# Patient Record
Sex: Female | Born: 1970 | Race: White | Hispanic: No | Marital: Married | Smoking: Never smoker
Health system: Southern US, Community
[De-identification: ages and names within clinical notes are randomized; demographics above are authoritative.]

## PROBLEM LIST (undated history)

## (undated) DIAGNOSIS — Q7010 Webbed fingers, unspecified hand: Secondary | ICD-10-CM

## (undated) DIAGNOSIS — F32A Depression, unspecified: Secondary | ICD-10-CM

## (undated) DIAGNOSIS — O09529 Supervision of elderly multigravida, unspecified trimester: Secondary | ICD-10-CM

## (undated) DIAGNOSIS — S96819A Strain of other specified muscles and tendons at ankle and foot level, unspecified foot, initial encounter: Secondary | ICD-10-CM

## (undated) DIAGNOSIS — R0600 Dyspnea, unspecified: Secondary | ICD-10-CM

## (undated) DIAGNOSIS — Z8619 Personal history of other infectious and parasitic diseases: Secondary | ICD-10-CM

## (undated) DIAGNOSIS — Z8249 Family history of ischemic heart disease and other diseases of the circulatory system: Secondary | ICD-10-CM

## (undated) DIAGNOSIS — M549 Dorsalgia, unspecified: Secondary | ICD-10-CM

## (undated) DIAGNOSIS — F419 Anxiety disorder, unspecified: Secondary | ICD-10-CM

## (undated) DIAGNOSIS — R0683 Snoring: Secondary | ICD-10-CM

## (undated) DIAGNOSIS — M5126 Other intervertebral disc displacement, lumbar region: Secondary | ICD-10-CM

## (undated) DIAGNOSIS — Z9989 Dependence on other enabling machines and devices: Secondary | ICD-10-CM

## (undated) DIAGNOSIS — F329 Major depressive disorder, single episode, unspecified: Secondary | ICD-10-CM

## (undated) DIAGNOSIS — R293 Abnormal posture: Secondary | ICD-10-CM

## (undated) DIAGNOSIS — G4733 Obstructive sleep apnea (adult) (pediatric): Secondary | ICD-10-CM

## (undated) DIAGNOSIS — R5383 Other fatigue: Secondary | ICD-10-CM

## (undated) DIAGNOSIS — E049 Nontoxic goiter, unspecified: Secondary | ICD-10-CM

## (undated) DIAGNOSIS — Z72820 Sleep deprivation: Secondary | ICD-10-CM

## (undated) DIAGNOSIS — T7840XA Allergy, unspecified, initial encounter: Secondary | ICD-10-CM

## (undated) DIAGNOSIS — S93499A Sprain of other ligament of unspecified ankle, initial encounter: Secondary | ICD-10-CM

## (undated) DIAGNOSIS — M2550 Pain in unspecified joint: Secondary | ICD-10-CM

## (undated) HISTORY — DX: Webbed fingers, unspecified hand: Q70.10

## (undated) HISTORY — DX: Sleep deprivation: Z72.820

## (undated) HISTORY — DX: Family history of ischemic heart disease and other diseases of the circulatory system: Z82.49

## (undated) HISTORY — PX: ACHILLES TENDON REPAIR: SUR1153

## (undated) HISTORY — DX: Supervision of elderly multigravida, unspecified trimester: O09.529

## (undated) HISTORY — PX: BACK SURGERY: SHX140

## (undated) HISTORY — DX: Dyspnea, unspecified: R06.00

## (undated) HISTORY — DX: Other intervertebral disc displacement, lumbar region: M51.26

## (undated) HISTORY — DX: Sprain of other ligament of unspecified ankle, initial encounter: S93.499A

## (undated) HISTORY — DX: Strain of other specified muscles and tendons at ankle and foot level, unspecified foot, initial encounter: S96.819A

## (undated) HISTORY — DX: Anxiety disorder, unspecified: F41.9

## (undated) HISTORY — DX: Pain in unspecified joint: M25.50

## (undated) HISTORY — DX: Personal history of other infectious and parasitic diseases: Z86.19

## (undated) HISTORY — DX: Allergy, unspecified, initial encounter: T78.40XA

## (undated) HISTORY — DX: Other fatigue: R53.83

## (undated) HISTORY — PX: SKIN GRAFT: SHX250

## (undated) HISTORY — DX: Depression, unspecified: F32.A

## (undated) HISTORY — DX: Nontoxic goiter, unspecified: E04.9

## (undated) HISTORY — DX: Dorsalgia, unspecified: M54.9

## (undated) HISTORY — DX: Abnormal posture: R29.3

## (undated) HISTORY — DX: Dependence on other enabling machines and devices: Z99.89

## (undated) HISTORY — DX: Obstructive sleep apnea (adult) (pediatric): G47.33

## (undated) HISTORY — DX: Snoring: R06.83

## (undated) HISTORY — DX: Major depressive disorder, single episode, unspecified: F32.9

---

## 1998-09-02 ENCOUNTER — Encounter: Admission: RE | Admit: 1998-09-02 | Discharge: 1998-12-01 | Payer: Self-pay | Admitting: Obstetrics & Gynecology

## 1999-01-21 ENCOUNTER — Other Ambulatory Visit: Admission: RE | Admit: 1999-01-21 | Discharge: 1999-01-21 | Payer: Self-pay | Admitting: Obstetrics and Gynecology

## 2000-03-02 ENCOUNTER — Other Ambulatory Visit: Admission: RE | Admit: 2000-03-02 | Discharge: 2000-03-02 | Payer: Self-pay | Admitting: Obstetrics and Gynecology

## 2001-03-10 ENCOUNTER — Other Ambulatory Visit: Admission: RE | Admit: 2001-03-10 | Discharge: 2001-03-10 | Payer: Self-pay | Admitting: *Deleted

## 2002-03-19 ENCOUNTER — Other Ambulatory Visit: Admission: RE | Admit: 2002-03-19 | Discharge: 2002-03-19 | Payer: Self-pay | Admitting: *Deleted

## 2003-08-28 ENCOUNTER — Other Ambulatory Visit: Admission: RE | Admit: 2003-08-28 | Discharge: 2003-08-28 | Payer: Self-pay | Admitting: Gynecology

## 2004-10-22 ENCOUNTER — Other Ambulatory Visit: Admission: RE | Admit: 2004-10-22 | Discharge: 2004-10-22 | Payer: Self-pay | Admitting: Gynecology

## 2005-11-04 ENCOUNTER — Other Ambulatory Visit: Admission: RE | Admit: 2005-11-04 | Discharge: 2005-11-04 | Payer: Self-pay | Admitting: Gynecology

## 2006-03-22 ENCOUNTER — Emergency Department (HOSPITAL_COMMUNITY): Admission: EM | Admit: 2006-03-22 | Discharge: 2006-03-22 | Payer: Self-pay | Admitting: Family Medicine

## 2006-08-25 ENCOUNTER — Ambulatory Visit (HOSPITAL_COMMUNITY): Admission: RE | Admit: 2006-08-25 | Discharge: 2006-08-26 | Payer: Self-pay | Admitting: Neurosurgery

## 2009-12-18 ENCOUNTER — Inpatient Hospital Stay (HOSPITAL_COMMUNITY): Admission: AD | Admit: 2009-12-18 | Discharge: 2009-12-20 | Payer: Self-pay | Admitting: Obstetrics and Gynecology

## 2010-08-30 LAB — CBC
HCT: 37.9 % (ref 36.0–46.0)
Hemoglobin: 10.3 g/dL — ABNORMAL LOW (ref 12.0–15.0)
Hemoglobin: 12.6 g/dL (ref 12.0–15.0)
MCH: 28.3 pg (ref 26.0–34.0)
MCV: 86.1 fL (ref 78.0–100.0)
RBC: 3.58 MIL/uL — ABNORMAL LOW (ref 3.87–5.11)
RBC: 4.44 MIL/uL (ref 3.87–5.11)
RDW: 14.5 % (ref 11.5–15.5)
RDW: 14.9 % (ref 11.5–15.5)

## 2010-08-30 LAB — RPR: RPR Ser Ql: NONREACTIVE

## 2010-10-30 NOTE — Op Note (Signed)
[name], Sharon Myers NO.:  [id number]   MEDICAL RECORD NO.:  [id number]          PATIENT TYPE:  OIB   LOCATION:  3013                         FACILITY:  [facility]   PHYSICIAN:  Clydene Fake, M.D.  DATE OF BIRTH:  04-Mar-1971   DATE OF PROCEDURE:  08/25/2006  DATE OF DISCHARGE:                               OPERATIVE REPORT   PREOPERATIVE DIAGNOSIS:  Herniated nucleus pulposus right L5-S1,  spondylosis.   POSTOPERATIVE DIAGNOSIS:  Herniated nucleus pulposus right L5-S1,  spondylosis.   PROCEDURE:  Right L5-S1 semi-hemilaminectomy and discectomy,  microdissection with microscope.   SURGEON:  Clydene Fake, M.D.   Threasa HeadsDanielle Dess.   ANESTHESIA:  General endotracheal tube anesthesia.   ESTIMATED BLOOD LOSS:  Minimal.   BLOOD GIVEN:  None.   DRAINS:  None.   COMPLICATIONS:  None.   REASON FOR PROCEDURE:  The patient is a 40 year old woman who has been  having back and right leg pain and numbness with decreased sensation in  the right S1 distribution, decreased reflex, and motor strength intact.  MRI was done, showing __________ disk herniation central to the right  causing canal stenosis and S1 root compression.  The patient brought in  for decompression.   PROCEDURE IN DETAIL:  The patient brought to operating room, general  anesthesia induced.  The patient was placed in the prone position on a  Wilson frame with all pressure points padded.  The patient was prepped  and draped in sterile fashion.  Needle was placed in the interspace.  X-  rays were obtained showing we were at the S1 spinous process.  Incision  was then made in the midline lower lumbar spine, centered just above  where the needle was placed.  Incision taken down to the fascia and  hemostasis obtained with Bovie cauterization.  The fascia was incised in  the right side and subperiosteal dissection was noted over the L5 and S1  spinous process of lamina up to the facet.   Self-retaining retractor was  placed.  A marker was placed at the interspace.  Another x-ray was  obtained, confirming our positioning at L5-S1.  The microscope was  brought in for microdissection.  At this point a high speed drill was  used to start a semi-hemilaminectomy and medial facetectomy.  This was  completed with Kerrison punches.  Ligamentum flavum was removed and a  foraminotomy was done over the S1 root.  We explored the epidural space  and found a large subligamentous disk herniation up into the S1 root.  At the thecal sac we incised the disk space and removed disk with  pituitary rongeurs and curets.  When we were finished, we had good  decompression of the canal and the S1 root and the fiber was checked and  there was no compression of that as it went up to its foramen up above  the disk space.  We got hemostasis with bipolar cauterization, Gelfoam,  and thrombin.  Gelfoam was irrigated out.  We irrigated with antibiotic  solution.  We again checked the nerve roots and we had good  decompression.  Depo-Medrol was placed in the epidural space.  Retractors were removed.  Fascia closed with 0 Vicryl interrupted  suture.  Subcutaneous tissue closed with 0, 2-0, and 3-0 Vicryl  interrupted suture.  Skin closed with Dermabond and skin glue.  When  that was dry, a dressing was placed.  The patient was placed back in the  supine position, awakened from anesthesia, and transferred to recovery  room in stable condition.           ______________________________  Clydene Fake, M.D.     JRH/MEDQ  D:  08/25/2006  T:  08/25/2006  Job:  [id number]

## 2010-11-17 ENCOUNTER — Other Ambulatory Visit (HOSPITAL_COMMUNITY): Payer: Self-pay | Admitting: Obstetrics and Gynecology

## 2010-11-17 DIAGNOSIS — O09529 Supervision of elderly multigravida, unspecified trimester: Secondary | ICD-10-CM

## 2010-11-17 DIAGNOSIS — Z36 Encounter for antenatal screening for chromosomal anomalies: Secondary | ICD-10-CM

## 2010-11-23 LAB — HIV ANTIBODY (ROUTINE TESTING W REFLEX): HIV: NONREACTIVE

## 2010-11-23 LAB — RPR: RPR: NONREACTIVE

## 2010-11-23 LAB — ABO/RH

## 2010-11-23 LAB — ANTIBODY SCREEN: Antibody Screen: NEGATIVE

## 2010-12-01 LAB — GC/CHLAMYDIA PROBE AMP, GENITAL
Chlamydia: NEGATIVE
Gonorrhea: NEGATIVE

## 2010-12-09 ENCOUNTER — Other Ambulatory Visit (HOSPITAL_COMMUNITY): Payer: Self-pay

## 2010-12-09 ENCOUNTER — Encounter (HOSPITAL_COMMUNITY): Payer: Self-pay

## 2010-12-11 ENCOUNTER — Other Ambulatory Visit (HOSPITAL_COMMUNITY): Payer: Self-pay

## 2011-01-05 ENCOUNTER — Other Ambulatory Visit (HOSPITAL_COMMUNITY): Payer: Self-pay | Admitting: Obstetrics and Gynecology

## 2011-01-05 DIAGNOSIS — O2690 Pregnancy related conditions, unspecified, unspecified trimester: Secondary | ICD-10-CM

## 2011-01-13 ENCOUNTER — Ambulatory Visit (HOSPITAL_COMMUNITY): Admission: RE | Admit: 2011-01-13 | Payer: 59 | Source: Ambulatory Visit

## 2011-01-13 ENCOUNTER — Encounter (HOSPITAL_COMMUNITY): Payer: Self-pay

## 2011-01-13 ENCOUNTER — Other Ambulatory Visit: Payer: Self-pay | Admitting: Maternal and Fetal Medicine

## 2011-01-13 ENCOUNTER — Ambulatory Visit (HOSPITAL_COMMUNITY)
Admission: RE | Admit: 2011-01-13 | Discharge: 2011-01-13 | Disposition: A | Discharge status: Home / Self Care | Payer: 59 | Source: Ambulatory Visit | Attending: Obstetrics and Gynecology | Admitting: Obstetrics and Gynecology

## 2011-01-13 ENCOUNTER — Other Ambulatory Visit (HOSPITAL_COMMUNITY): Payer: Self-pay | Admitting: Obstetrics and Gynecology

## 2011-01-13 DIAGNOSIS — Z363 Encounter for antenatal screening for malformations: Secondary | ICD-10-CM | POA: Insufficient documentation

## 2011-01-13 DIAGNOSIS — Z1389 Encounter for screening for other disorder: Secondary | ICD-10-CM | POA: Insufficient documentation

## 2011-01-13 DIAGNOSIS — O09529 Supervision of elderly multigravida, unspecified trimester: Secondary | ICD-10-CM | POA: Insufficient documentation

## 2011-01-13 DIAGNOSIS — O358XX0 Maternal care for other (suspected) fetal abnormality and damage, not applicable or unspecified: Secondary | ICD-10-CM | POA: Insufficient documentation

## 2011-01-13 DIAGNOSIS — O2690 Pregnancy related conditions, unspecified, unspecified trimester: Secondary | ICD-10-CM

## 2011-01-13 NOTE — Progress Notes (Signed)
See ultrasound report in [facility] 

## 2011-01-29 ENCOUNTER — Telehealth (HOSPITAL_COMMUNITY): Payer: Self-pay | Admitting: MS"

## 2011-01-29 NOTE — Telephone Encounter (Signed)
Patient called to inquire about amniotic fluid AFP result. She previously was contacted by Despina Arias regarding apparently normal chromosomes from amniocentesis. AF-AFP is normal. Result to patient.   Clydie Braun Dallin Mccorkel, MS, [facility] 01/29/11

## 2011-03-09 ENCOUNTER — Other Ambulatory Visit: Payer: Self-pay | Admitting: Maternal and Fetal Medicine

## 2011-06-15 NOTE — L&D Delivery Note (Signed)
Delivery Note At 10:25 AM a viable and healthy female was delivered via Vaginal, Spontaneous Delivery (Presentation: Right Occiput Anterior).  APGAR: 8, 9; weight 7 lb 10.8 oz (3481 g).   Placenta status: Intact, Spontaneous. Not sent  Cord: 3 vessels with the following complications: CAN x 1 not reducible, clamped and cut  Cord pH: none  Anesthesia: Epidural  Episiotomy: None Lacerations: Labial Suture Repair: 3.0 chromic Est. Blood Loss (mL): 200  Mom to postpartum.  Baby to nursery-stable.  Hina Gupta A 06/27/2011, 11:06 AM

## 2011-06-17 ENCOUNTER — Encounter (HOSPITAL_COMMUNITY): Payer: Self-pay | Admitting: *Deleted

## 2011-06-17 ENCOUNTER — Telehealth (HOSPITAL_COMMUNITY): Payer: Self-pay | Admitting: *Deleted

## 2011-06-17 NOTE — Telephone Encounter (Signed)
Preadmission screen  

## 2011-06-26 ENCOUNTER — Other Ambulatory Visit: Payer: Self-pay | Admitting: Obstetrics and Gynecology

## 2011-06-26 ENCOUNTER — Encounter (HOSPITAL_COMMUNITY): Payer: Self-pay

## 2011-06-26 ENCOUNTER — Inpatient Hospital Stay (HOSPITAL_COMMUNITY)
Admission: RE | Admit: 2011-06-26 | Discharge: 2011-06-29 | DRG: 775 | Disposition: A | Discharge status: Home / Self Care | Payer: 59 | Source: Ambulatory Visit | Attending: Obstetrics and Gynecology | Admitting: Obstetrics and Gynecology

## 2011-06-26 DIAGNOSIS — O09529 Supervision of elderly multigravida, unspecified trimester: Secondary | ICD-10-CM | POA: Diagnosis present

## 2011-06-26 LAB — CBC
MCH: 29.8 pg (ref 26.0–34.0)
MCHC: 35 g/dL (ref 30.0–36.0)
Platelets: 174 10*3/uL (ref 150–400)
RBC: 4.49 MIL/uL (ref 3.87–5.11)

## 2011-06-26 MED ORDER — ZOLPIDEM TARTRATE 10 MG PO TABS
10.0000 mg | ORAL_TABLET | Freq: Every evening | ORAL | Status: DC | PRN
  Administered 2011-06-26: 10 mg via ORAL
  Filled 2011-06-26: qty 1

## 2011-06-26 MED ORDER — ONDANSETRON HCL 4 MG/2ML IJ SOLN
4.0000 mg | Freq: Four times a day (QID) | INTRAMUSCULAR | Status: DC | PRN
  Administered 2011-06-27: 4 mg via INTRAVENOUS
  Filled 2011-06-26: qty 2

## 2011-06-26 MED ORDER — IBUPROFEN 600 MG PO TABS: 600.0000 mg | ORAL_TABLET | Freq: Four times a day (QID) | ORAL | Status: DC | PRN

## 2011-06-26 MED ORDER — OXYTOCIN 10 UNIT/ML IJ SOLN: 10.0000 [IU] | Freq: Once | INTRAMUSCULAR | Status: DC

## 2011-06-26 MED ORDER — LIDOCAINE HCL (PF) 1 % IJ SOLN
30.0000 mL | INTRAMUSCULAR | Status: DC | PRN
  Administered 2011-06-27: 30 mL via SUBCUTANEOUS
  Filled 2011-06-26 (×2): qty 30

## 2011-06-26 MED ORDER — TERBUTALINE SULFATE 1 MG/ML IJ SOLN: 0.2500 mg | Freq: Once | INTRAMUSCULAR | Status: AC | PRN

## 2011-06-26 MED ORDER — OXYCODONE-ACETAMINOPHEN 5-325 MG PO TABS: 2.0000 | ORAL_TABLET | ORAL | Status: DC | PRN

## 2011-06-26 MED ORDER — OXYTOCIN 20 UNITS IN LACTATED RINGERS INFUSION - SIMPLE
1.0000 m[IU]/min | INTRAVENOUS | Status: DC
  Administered 2011-06-27: 999 m[IU]/min via INTRAVENOUS
  Filled 2011-06-26: qty 1000

## 2011-06-26 MED ORDER — LACTATED RINGERS IV SOLN
500.0000 mL | INTRAVENOUS | Status: DC | PRN
  Administered 2011-06-27: 1000 mL via INTRAVENOUS

## 2011-06-26 MED ORDER — MISOPROSTOL 25 MCG QUARTER TABLET
25.0000 ug | ORAL_TABLET | ORAL | Status: DC | PRN
  Administered 2011-06-26 – 2011-06-27 (×3): 25 ug via VAGINAL
  Filled 2011-06-26 (×3): qty 0.25

## 2011-06-26 MED ORDER — ACETAMINOPHEN 325 MG PO TABS: 650.0000 mg | ORAL_TABLET | ORAL | Status: DC | PRN

## 2011-06-26 MED ORDER — NALBUPHINE SYRINGE 5 MG/0.5 ML
10.0000 mg | INJECTION | INTRAMUSCULAR | Status: DC | PRN
  Administered 2011-06-27: 10 mg via INTRAVENOUS
  Filled 2011-06-26 (×2): qty 1

## 2011-06-26 MED ORDER — LACTATED RINGERS IV SOLN
INTRAVENOUS | Status: DC
  Administered 2011-06-26 – 2011-06-27 (×2): via INTRAVENOUS

## 2011-06-26 MED ORDER — CITRIC ACID-SODIUM CITRATE 334-500 MG/5ML PO SOLN: 30.0000 mL | ORAL | Status: DC | PRN

## 2011-06-26 NOTE — H&P (Signed)
Sharon Myers is a 41 y.o. female presenting for induction 2nd to AMA 41 yo History OB History    Grav Para Term Preterm Abortions TAB SAB Ect Mult Living   4 1 1  0 2 1 1  0 0 1     Past Medical History  Diagnosis Date  . AMA (advanced maternal age) multigravida 35+   . Depression   . Other ankle sprain and strain     achilles rupture  . Goiter, unspecified   . History of chicken pox   . 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 Ruptured lumbar disc    Past Surgical History  Procedure Date  . Back surgery   . Achilles tendon repair   . Skin graft    Family History: family history includes Arthritis in her maternal grandmother; COPD in her father; Cancer in her father, maternal grandfather, and mother; Diabetes in her father; Heart disease in her maternal grandfather; Heart failure in her mother; Hypertension in her father; Nephrolithiasis in her father; Stroke in her mother; and Thyroid disease in her maternal aunt. Social History:  reports that she has never smoked. She has never used smokeless tobacco. She reports that she does not drink alcohol or use illicit drugs.  ROS neg   Temperature 97.7 F (36.5 C), temperature source Oral, height 5\' 9"  (1.753 m), weight 112.038 kg (247 lb). Exam Physical Exam  Constitutional: She is oriented to person, place, and time. She appears well-developed and well-nourished.  HENT:  Head: Normocephalic.  Eyes: EOM are normal.  Cardiovascular: Regular rhythm.   Respiratory: Breath sounds normal.  GI: Soft.  Musculoskeletal: She exhibits edema.       (+) 1  Neurological: She is alert and oriented to person, place, and time.  Skin: Skin is warm and dry.  Psychiatric: She has a normal mood and affect.    Prenatal labs: ABO, Rh: O/Positive/-- (06/11 0000) Antibody: Negative (06/11 0000) Rubella: Immune (06/11 0000) RPR: Nonreactive (06/11 0000)  HBsAg: Negative (06/11 0000)  HIV: Non-reactive (06/11 0000)  GBS: Negative (12/12  0000)   Assessment/Plan: Term gestation P) admit   2) routine labs 3) cytotec. Epidural prn   Briselda Naval A 06/26/2011, 8:48 PM

## 2011-06-27 ENCOUNTER — Encounter (HOSPITAL_COMMUNITY): Payer: Self-pay | Admitting: Anesthesiology

## 2011-06-27 ENCOUNTER — Encounter (HOSPITAL_COMMUNITY): Payer: Self-pay

## 2011-06-27 ENCOUNTER — Inpatient Hospital Stay (HOSPITAL_COMMUNITY): Payer: 59 | Admitting: Anesthesiology

## 2011-06-27 DIAGNOSIS — O09529 Supervision of elderly multigravida, unspecified trimester: Secondary | ICD-10-CM | POA: Diagnosis present

## 2011-06-27 LAB — ABO/RH: ABO/RH(D): O POS

## 2011-06-27 LAB — RPR: RPR Ser Ql: NONREACTIVE

## 2011-06-27 MED ORDER — PHENYLEPHRINE 40 MCG/ML (10ML) SYRINGE FOR IV PUSH (FOR BLOOD PRESSURE SUPPORT)
80.0000 ug | PREFILLED_SYRINGE | INTRAVENOUS | Status: DC | PRN
  Filled 2011-06-27: qty 5

## 2011-06-27 MED ORDER — ZOLPIDEM TARTRATE 5 MG PO TABS: 5.0000 mg | ORAL_TABLET | Freq: Every evening | ORAL | Status: DC | PRN

## 2011-06-27 MED ORDER — FENTANYL 2.5 MCG/ML BUPIVACAINE 1/10 % EPIDURAL INFUSION (WH - ANES)
14.0000 mL/h | INTRAMUSCULAR | Status: DC
  Filled 2011-06-27: qty 60

## 2011-06-27 MED ORDER — TETANUS-DIPHTH-ACELL PERTUSSIS 5-2.5-18.5 LF-MCG/0.5 IM SUSP: 0.5000 mL | Freq: Once | INTRAMUSCULAR | Status: DC

## 2011-06-27 MED ORDER — OXYCODONE-ACETAMINOPHEN 5-325 MG PO TABS
1.0000 | ORAL_TABLET | ORAL | Status: DC | PRN
  Administered 2011-06-27: 1 via ORAL
  Administered 2011-06-28: 2 via ORAL
  Administered 2011-06-28 – 2011-06-29 (×4): 1 via ORAL
  Filled 2011-06-27 (×2): qty 1
  Filled 2011-06-27: qty 2
  Filled 2011-06-27: qty 1
  Filled 2011-06-27: qty 2
  Filled 2011-06-27: qty 1

## 2011-06-27 MED ORDER — SERTRALINE HCL 25 MG PO TABS
25.0000 mg | ORAL_TABLET | Freq: Every day | ORAL | Status: DC
  Administered 2011-06-27 – 2011-06-29 (×3): 25 mg via ORAL
  Filled 2011-06-27 (×4): qty 1

## 2011-06-27 MED ORDER — EPHEDRINE 5 MG/ML INJ
10.0000 mg | INTRAVENOUS | Status: DC | PRN
  Filled 2011-06-27: qty 4

## 2011-06-27 MED ORDER — SIMETHICONE 80 MG PO CHEW: 80.0000 mg | CHEWABLE_TABLET | ORAL | Status: DC | PRN

## 2011-06-27 MED ORDER — PHENYLEPHRINE 40 MCG/ML (10ML) SYRINGE FOR IV PUSH (FOR BLOOD PRESSURE SUPPORT): 80.0000 ug | PREFILLED_SYRINGE | INTRAVENOUS | Status: DC | PRN

## 2011-06-27 MED ORDER — DIPHENHYDRAMINE HCL 50 MG/ML IJ SOLN: 12.5000 mg | INTRAMUSCULAR | Status: DC | PRN

## 2011-06-27 MED ORDER — LACTATED RINGERS IV SOLN: 500.0000 mL | Freq: Once | INTRAVENOUS | Status: DC

## 2011-06-27 MED ORDER — WITCH HAZEL-GLYCERIN EX PADS
1.0000 "application " | MEDICATED_PAD | CUTANEOUS | Status: DC | PRN
  Administered 2011-06-27: 1 via TOPICAL

## 2011-06-27 MED ORDER — BENZOCAINE-MENTHOL 20-0.5 % EX AERO: 1.0000 "application " | INHALATION_SPRAY | CUTANEOUS | Status: DC | PRN

## 2011-06-27 MED ORDER — EPHEDRINE 5 MG/ML INJ: 10.0000 mg | INTRAVENOUS | Status: DC | PRN

## 2011-06-27 MED ORDER — IBUPROFEN 600 MG PO TABS
600.0000 mg | ORAL_TABLET | Freq: Four times a day (QID) | ORAL | Status: DC
  Administered 2011-06-27 – 2011-06-29 (×8): 600 mg via ORAL
  Filled 2011-06-27 (×8): qty 1

## 2011-06-27 MED ORDER — SODIUM BICARBONATE 8.4 % IV SOLN
INTRAVENOUS | Status: DC | PRN
  Administered 2011-06-27: 5 mL via EPIDURAL

## 2011-06-27 MED ORDER — SENNOSIDES-DOCUSATE SODIUM 8.6-50 MG PO TABS
2.0000 | ORAL_TABLET | Freq: Every day | ORAL | Status: DC
  Administered 2011-06-27 – 2011-06-28 (×2): 2 via ORAL

## 2011-06-27 MED ORDER — ONDANSETRON HCL 4 MG PO TABS: 4.0000 mg | ORAL_TABLET | ORAL | Status: DC | PRN

## 2011-06-27 MED ORDER — LANOLIN HYDROUS EX OINT: TOPICAL_OINTMENT | CUTANEOUS | Status: DC | PRN

## 2011-06-27 MED ORDER — ONDANSETRON HCL 4 MG/2ML IJ SOLN: 4.0000 mg | INTRAMUSCULAR | Status: DC | PRN

## 2011-06-27 MED ORDER — BENZOCAINE-MENTHOL 20-0.5 % EX AERO
INHALATION_SPRAY | CUTANEOUS | Status: AC
  Filled 2011-06-27: qty 56

## 2011-06-27 MED ORDER — LACTATED RINGERS IV SOLN
INTRAVENOUS | Status: DC
  Administered 2011-06-27: 10:00:00 via INTRAUTERINE

## 2011-06-27 MED ORDER — PRENATAL MULTIVITAMIN CH
1.0000 | ORAL_TABLET | Freq: Every day | ORAL | Status: DC
  Administered 2011-06-27 – 2011-06-29 (×3): 1 via ORAL
  Filled 2011-06-27 (×3): qty 1

## 2011-06-27 MED ORDER — FENTANYL 2.5 MCG/ML BUPIVACAINE 1/10 % EPIDURAL INFUSION (WH - ANES)
INTRAMUSCULAR | Status: DC | PRN
  Administered 2011-06-27: 14 mL/h via EPIDURAL

## 2011-06-27 MED ORDER — DIPHENHYDRAMINE HCL 25 MG PO CAPS: 25.0000 mg | ORAL_CAPSULE | Freq: Four times a day (QID) | ORAL | Status: DC | PRN

## 2011-06-27 MED ORDER — DIBUCAINE 1 % RE OINT
1.0000 "application " | TOPICAL_OINTMENT | RECTAL | Status: DC | PRN
  Administered 2011-06-28: 1 via RECTAL
  Filled 2011-06-27: qty 28

## 2011-06-27 NOTE — Progress Notes (Signed)
S: c/o painful ctx. S/p cytotec x 3  O: BP 138/83   Abd gravid mild palp ctx Pelvic: 4-5/70-80/-3 vtx applied. AROM copious clear fluid IUPC, ISE placed  Tracing: baseline 140 (+) early decels small variables Ctx q 2 mins  IMP: term gestation Active phase P ) pitocin augmentation prn. Exaggerated left sims. Epidural

## 2011-06-27 NOTE — Progress Notes (Addendum)
S: epidural  VE: deferred  Tracing: baseline 140 (+) variable decels. Ctx q 2-3 mins  IMP: variable decels  P) amnioinfusion    Addendum: exam fully/-1 bloody show. Denies rectal pressure

## 2011-06-27 NOTE — Plan of Care (Signed)
Problem: Consults Goal: Birthing Suites Patient Information Press F2 to bring up selections list Outcome: Completed/Met Date Met:  06/27/11  Pt 37-[redacted] weeks EGA  Comments:  39.3 wks

## 2011-06-27 NOTE — Progress Notes (Signed)
Pt moving during BP reading--not believed to be accurate

## 2011-06-27 NOTE — Anesthesia Postprocedure Evaluation (Signed)
  Anesthesia Post-op Note  Patient: Sharon Myers  Procedure(s) Performed: * No procedures listed *  Patient Location: PACU and Mother/Baby  Anesthesia Type: Epidural  Level of Consciousness: awake, alert  and oriented  Airway and Oxygen Therapy: Patient Spontanous Breathing   Post-op Assessment: Patient's Cardiovascular Status Stable and Respiratory Function Stable  Post-op Vital Signs: stable  Complications: No apparent anesthesia complications

## 2011-06-27 NOTE — Anesthesia Procedure Notes (Signed)
Epidural Patient location during procedure: OB  Preanesthetic Checklist Completed: patient identified, site marked, surgical consent, pre-op evaluation, timeout performed, IV checked, risks and benefits discussed and monitors and equipment checked  Epidural Patient position: sitting Prep: site prepped and draped and DuraPrep Patient monitoring: continuous pulse ox and blood pressure Approach: midline Injection technique: LOR air  Needle:  Needle type: Tuohy  Needle gauge: 17 G Needle length: 9 cm Needle insertion depth: 6 cm Catheter type: closed end flexible Catheter size: 19 Gauge Catheter at skin depth: 12 cm Test dose: negative  Assessment Events: blood not aspirated, injection not painful, no injection resistance, negative IV test and no paresthesia  Additional Notes Dosing of Epidural:  1st dose, through needle ............................................. epi 1:200K + Xylocaine 40 mg  2nd dose, through catheter, after waiting 3 minutes.....epi 1:200K + Xylocaine 60 mg  3rd dose, through catheter after waiting 3 minutes .............................Marcaine   5mg   ( mg Marcaine are expressed as equivilent  cc's medication removed from the 0.1%Bupiv / fentanyl syringe from L&D pump)  ( 2% Xylo charted as a single dose in Epic Meds for ease of charting; actual dosing was fractionated as above, for saftey's sake)  As each dose occurred, patient was free of IV sx; and patient exhibited no evidence of SA injection.  Patient is more comfortable after epidural dosed. Please see RN's note for documentation of vital signs,and FHR which are stable.       

## 2011-06-27 NOTE — Anesthesia Preprocedure Evaluation (Addendum)
Anesthesia Evaluation  Patient identified by MRN, date of birth, ID band Patient awake    Reviewed: Allergy & Precautions, H&P , Patient's Chart, lab work & pertinent test results  Airway Mallampati: II TM Distance: >3 FB Neck ROM: full    Dental  (+) Teeth Intact   Pulmonary  clear to auscultation        Cardiovascular regular Normal    Neuro/Psych PSYCHIATRIC DISORDERS    GI/Hepatic   Endo/Other  Morbid obesity  Renal/GU      Musculoskeletal   Abdominal   Peds  Hematology   Anesthesia Other Findings       Reproductive/Obstetrics (+) Pregnancy                          Anesthesia Physical Anesthesia Plan  ASA: III  Anesthesia Plan: Epidural   Post-op Pain Management:    Induction:   Airway Management Planned:   Additional Equipment:   Intra-op Plan:   Post-operative Plan:   Informed Consent: I have reviewed the patients History and Physical, chart, labs and discussed the procedure including the risks, benefits and alternatives for the proposed anesthesia with the patient or authorized representative who has indicated his/her understanding and acceptance.   Dental Advisory Given  Plan Discussed with:   Anesthesia Plan Comments: (Labs checked- platelets confirmed with RN in room. Fetal heart tracing, per RN, reported to be stable enough for sitting procedure. Discussed epidural, and patient consents to the procedure:  included risk of possible headache,backache, failed block, allergic reaction, and nerve injury. This patient was asked if she had any questions or concerns before the procedure started. )        Anesthesia Quick Evaluation

## 2011-06-28 LAB — CBC
Platelets: 146 10*3/uL — ABNORMAL LOW (ref 150–400)
RBC: 3.86 MIL/uL — ABNORMAL LOW (ref 3.87–5.11)
WBC: 14.6 10*3/uL — ABNORMAL HIGH (ref 4.0–10.5)

## 2011-06-28 NOTE — Progress Notes (Signed)

## 2011-06-28 NOTE — Progress Notes (Signed)
Patient ID: Sharon Myers, female   DOB: 1971-05-11, 41 y.o.   MRN: [id number]  PPD 1 SVD  S:  Reports feeling tired / + backache at epidural site             Tolerating po/ No nausea or vomiting             Bleeding is moderate             Pain controlled with motrin and percocet             Up ad lib / ambulatory  Newborn breast feeding  / female newborn   O:  A & O x 3 NAD             VS: Blood pressure 124/80, pulse 62, temperature 98 F (36.7 C), temperature source Oral, resp. rate 20, height 5\' 9"  (1.753 m), weight 112.038 kg (247 lb), SpO2 97.00%, unknown if currently breastfeeding.  LABS: WBC/Hgb/Hct/Plts:  14.6/11.3/33.4/146 (01/14 0520)             Epidural site: + edema at site / no echymosis   Lungs: Clear and unlabored  Heart: regular rate and rhythm / no mumurs  Abdomen: soft, non-tender, non-distended              Fundus: firm, non-tender, U-1  Perineum: mild edema - ice pack in place  Lochia: light  Extremities: no edema, no calf pain or tenderness    A: PPD # 1   Doing well - stable status  P:  Routine post partum orders  K-Pad on cold setting today to back to reduce soft tissue edema from epidural placement                Warm compresses after initial 24 hours of cold application to reduce inflammation / swelling            Anticipate discharge to home tomorrow  Ceclia Koker 06/28/2011, 9:27 AM

## 2011-06-29 ENCOUNTER — Encounter (HOSPITAL_COMMUNITY): Payer: Self-pay

## 2011-06-29 MED ORDER — OXYCODONE-ACETAMINOPHEN 5-325 MG PO TABS: 1.0000 | ORAL_TABLET | ORAL | Status: AC | PRN

## 2011-06-29 MED ORDER — IBUPROFEN 600 MG PO TABS: 600.0000 mg | ORAL_TABLET | Freq: Four times a day (QID) | ORAL | Status: AC

## 2011-06-29 MED ORDER — HYDROCORTISONE 2.5 % RE CREA: TOPICAL_CREAM | Freq: Two times a day (BID) | RECTAL | Status: AC

## 2011-06-29 NOTE — Progress Notes (Signed)
Post Partum Day #2            Information for the patient's newborn:  Claudina, Oliphant [[id number]]  female   / circumcision done Feeding: breast  Subjective: No HA, SOB, CP, F/C, breast symptoms. Pain controlled w/ Motrin and occ Percocet. Normal vaginal bleeding, no clots.      Objective:  Temp:  [98.1 F (36.7 C)-98.6 F (37 C)] 98.6 F (37 C) (01/15 0516) Pulse Rate:  [62-78] 62  (01/15 0516) Resp:  [17-18] 18  (01/15 0516) BP: (113-124)/(78-82) 123/78 mmHg (01/15 0516) SpO2:  [97 %] 97 % (01/15 0516)  No intake or output data in the 24 hours ending 06/29/11 1128     Basename 06/28/11 0520 06/26/11 2008  WBC 14.6* 10.6*  HGB 11.3* 13.4  HCT 33.4* 38.3  PLT 146* 174    Blood type: --/--/O POS (01/12 2008) Rubella: Immune (06/11 0000)    Physical Exam:  General: alert, cooperative and no distress Uterine Fundus: firm Lochia: appropriate Perineum: repair intact, edema none, (+) hemorrhoids, no ecchymosis / thrombosis DVT Evaluation: No evidence of DVT seen on physical exam. Trace edema    Assessment/Plan: PPD # 2 / 41 y.o., [id number] S/P:induced vaginal      normal postpartum exam  Continue current postpartum care  Proctocream to hemorrhoids, bowel regimen  D/C home   LOS: 3 days   Sharon Myers, CNM, MSN 06/29/2011, 11:28 AM

## 2011-06-29 NOTE — Discharge Summary (Signed)
Obstetric Discharge Summary Reason for Admission: induction of labor Prenatal Procedures: NST and ultrasound Intrapartum Procedures: spontaneous vaginal delivery Postpartum Procedures: none Complications-Operative and Postpartum: labial laceration Hemoglobin  Date Value Range Status  06/28/2011 11.3* 12.0-15.0 (g/dL) Final     HCT  Date Value Range Status  06/28/2011 33.4* 36.0-46.0 (%) Final    Discharge Diagnoses: Term Pregnancy-delivered  Discharge Information: Date: 06/29/2011 Activity: unrestricted Diet: routine Medications: PNV, Ibuprofen, Percocet and Zoloft Condition: stable Instructions: refer to practice specific booklet Discharge to: home Follow-up Information    Follow up with Aloysius Heinle A, MD in 6 weeks.   Contact information:   [address] [place] [address] [phone]          Newborn Data: Live born female "Shon Baton" Birth Weight: 7 lb 10.8 oz (3481 g) APGAR: 8, 9  Home with mother.  PAUL,DANIELA 06/29/2011, 11:32 AM

## 2012-07-17 LAB — HM MAMMOGRAPHY: HM Mammogram: NORMAL

## 2012-11-22 ENCOUNTER — Encounter: Payer: Self-pay | Admitting: Internal Medicine

## 2012-11-22 ENCOUNTER — Ambulatory Visit (INDEPENDENT_AMBULATORY_CARE_PROVIDER_SITE_OTHER): Payer: 59 | Admitting: Internal Medicine

## 2012-11-22 VITALS — BP 120/78 | HR 83 | Temp 98.3°F | Ht 68.75 in | Wt 233.0 lb

## 2012-11-22 DIAGNOSIS — M79671 Pain in right foot: Secondary | ICD-10-CM | POA: Insufficient documentation

## 2012-11-22 DIAGNOSIS — R5381 Other malaise: Secondary | ICD-10-CM

## 2012-11-22 DIAGNOSIS — R5383 Other fatigue: Secondary | ICD-10-CM

## 2012-11-22 DIAGNOSIS — R209 Unspecified disturbances of skin sensation: Secondary | ICD-10-CM

## 2012-11-22 DIAGNOSIS — R200 Anesthesia of skin: Secondary | ICD-10-CM | POA: Insufficient documentation

## 2012-11-22 DIAGNOSIS — R202 Paresthesia of skin: Secondary | ICD-10-CM | POA: Insufficient documentation

## 2012-11-22 DIAGNOSIS — M79609 Pain in unspecified limb: Secondary | ICD-10-CM

## 2012-11-22 DIAGNOSIS — E049 Nontoxic goiter, unspecified: Secondary | ICD-10-CM

## 2012-11-22 DIAGNOSIS — M79672 Pain in left foot: Secondary | ICD-10-CM | POA: Insufficient documentation

## 2012-11-22 DIAGNOSIS — F4321 Adjustment disorder with depressed mood: Secondary | ICD-10-CM | POA: Insufficient documentation

## 2012-11-22 LAB — CBC WITH DIFFERENTIAL/PLATELET
Basophils Absolute: 0 10*3/uL (ref 0.0–0.1)
HCT: 42.4 % (ref 36.0–46.0)
Lymphs Abs: 2.6 10*3/uL (ref 0.7–4.0)
MCV: 89.6 fl (ref 78.0–100.0)
Monocytes Absolute: 0.7 10*3/uL (ref 0.1–1.0)
Neutrophils Relative %: 54.4 % (ref 43.0–77.0)
Platelets: 206 10*3/uL (ref 150.0–400.0)
RDW: 13.7 % (ref 11.5–14.6)
WBC: 7.4 10*3/uL (ref 4.5–10.5)

## 2012-11-22 LAB — LIPID PANEL
Cholesterol: 160 mg/dL (ref 0–200)
HDL: 49 mg/dL (ref >39.00)
LDL Cholesterol: 90 mg/dL (ref 0–99)
Total CHOL/HDL Ratio: 3
Triglycerides: 104 mg/dL (ref 0.0–149.0)
VLDL: 20.8 mg/dL (ref 0.0–40.0)

## 2012-11-22 LAB — BASIC METABOLIC PANEL
BUN: 12 mg/dL (ref 6–23)
Chloride: 107 mEq/L (ref 96–112)
Creatinine, Ser: 0.8 mg/dL (ref 0.4–1.2)
GFR: 89.92 mL/min (ref >60.00)
Glucose, Bld: 86 mg/dL (ref 70–99)
Potassium: 3.9 mEq/L (ref 3.5–5.1)

## 2012-11-22 LAB — HEPATIC FUNCTION PANEL
Bilirubin, Direct: 0 mg/dL (ref 0.0–0.3)
Total Bilirubin: 0.4 mg/dL (ref 0.3–1.2)

## 2012-11-22 LAB — SEDIMENTATION RATE: Sed Rate: 13 mm/hr (ref 0–22)

## 2012-11-22 NOTE — Patient Instructions (Addendum)
Ok to give time   For the   Tingling goes away.     Consider    Prednisone and referral    To   Your Back specialist.      I suggest you see Dr Doristine Church [place] [facility]) [place]  About the feet ankle etc in the meantime.   Increase zoloft  to 100 mg per  Day.     Agree with counseling.   Labs to r/o  Metabolic problems   ROV in  3-4 weeks.

## 2012-11-22 NOTE — Progress Notes (Signed)
Chief Complaint  Patient presents with  . Establish Care    Believes she has a sciatic nerve issue.  Has seen a back specialist before due to ruptured disc.  She has a family hx of bloot clots.  She is concerned about her own health.  She has 2 young children under 3 and a very demanding job.  Feels like she is "losing it."    HPI: Patient comes in as new patient visit . Previous care was  Various   PCPs   . Non e recnetly   [facility]gy[facility].   Dr Cherly Hensen  See above issues :  Back  After softball injury  Achilles rupture and fall.   OCt 08 and then back march 09 .  Ruptured disc l5 s1  Tingling legs got better   Had recent trip   In [place]    Turing around in car    righ but area down side of lege  Leg  Asleep when up and around.   No weakness   And  Not getting better.   No motor changes falling  considering new mattress.  Tired and feeling cant  Juggle and accomplish much and by end of week getting irritable feet hurt all the time feels stiff in the morning wonders if she's should feel this bad. Children 18 months and 78 years old.   Have day care  [place]  mon through Friday.    At least 50  Per week.   On screen.  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 m died and had preg loss .   Taking 50 mg   Usually.   Self titrate's .    Helps control.  Hx of counseling years ago.  Not recently  Husband works outside Air traffic controller for [employer] worries about blood clots cause father may have had one post op in hosptital.  No hx of same  During preg or on OCPS  In  past ROS: See pertinent positives and negatives per HPI. No cp sob syncope  Mild hay fever no bleeding no hx of clotting  Some snoring  No apnea.  Hx goiter and nl labs  Dr Lucianne Muss in past  Over 5 years   Father ? From clot . MOM had  necre exgrnauloma   And secondary stroke.   Past Medical History  Diagnosis Date  . AMA (advanced maternal age) multigravida 35+   .  Depression   . Other ankle sprain and strain     achilles rupture  . Goiter, unspecified   . History of chicken pox   . 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 Ruptured lumbar disc   . Hx of varicella   . Allergy    Past Surgical History  Procedure Laterality Date  . Back surgery      hirsch  . Achilles tendon repair      Left softball injury Dr. Lestine Box  . Skin graft        Family History  Problem Relation Age of Onset  . Cancer Mother     rare blood cancer  . Stroke Mother   . Heart failure Mother   . Cancer Father     liver also kidney  . Hypertension Father   . Nephrolithiasis Father   . Diabetes Father   . COPD Father   . Thyroid  disease Maternal Aunt   . Arthritis Maternal Grandmother     rheumatoid  . Heart disease Maternal Grandfather   . Cancer Maternal Grandfather     stomach    History   Social History  . Marital Status: Married    Spouse Name: N/A    Number of Children: N/A  . Years of Education: N/A   Social History Main Topics  . Smoking status: Never Smoker   . Smokeless tobacco: Never Used  . Alcohol Use: No  . Drug Use: No  . Sexually Active: Yes     Comment: tubal if CS   Other Topics Concern  . None   Social History Narrative   Usually receives 6 hours of sleep at night   4 people living in the home. No pets   Married 2 young children   Bachelors degree [employer] 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 sertraline (ZOLOFT) 25 MG tablet Take 25 mg by mouth daily.       . [DISCONTINUED] Docusate Calcium (STOOL SOFTENER PO) Take by mouth as needed.        . [DISCONTINUED] PRENATAL VITAMINS PO Take by mouth.         No facility-administered encounter medications on file as of 11/22/2012.    EXAM:  BP 120/78  Pulse 83  Temp(Src)  98.3 F (36.8 C) (Oral)  Ht 5' 8.75" (1.746 m)  Wt 233 lb (105.688 kg)  BMI 34.67 kg/m2  SpO2 98%  LMP 11/18/2012  Breastfeeding? No  Body mass index is 34.67 kg/(m^2).  GENERAL: vitals reviewed and listed above, alert, oriented, appears well hydrated and in no acute distress able to walk normally cross legs. HEENT: atraumatic, conjunctiva  clear, no obvious abnormalities on inspection of external nose and ears OP : no lesion edema or exudate tongue is midline NECK: no obvious masses on inspection palpation no adenopathy or bruit thyroid is easily palpable possibly asymmetry left more than right LUNGS: clear to auscultation bilaterally, no wheezes, rales or rhonchi, good air movement CV: HRRR, no clubbing cyanosis or  peripheral edema nl cap refill  Abdomen soft without organomegaly guarding or rebound MS: moves all extremities without noticeable focal  Abnormality decreased dorsi flexion left Achilles old surgical scar no point tenderness right Achilles Neurologic toe and heel strength. He normal uncertain lower extremity reflexes secondary surgery of present no clonus negative SLR PSYCH: pleasant and cooperative,  normal eye contact occasionally tearful Cognition intact  articulate ASSESSMENT AND PLAN:  Discussed the following assessment and plan:  Numbness and tingling of leg - Plan: HM MAMMOGRAPHY, HM PAP SMEAR, Basic metabolic panel, CBC with Differential, Hepatic function panel, Lipid panel, TSH, T4, free, T3, free, Thyroid antibodies, Sedimentation rate  Goiter - Plan: HM MAMMOGRAPHY, HM PAP SMEAR, Basic metabolic panel, CBC with Differential, Hepatic function panel, Lipid panel, TSH, T4, free, T3, free, Thyroid antibodies, Sedimentation rate  Other malaise and fatigue - Plan: HM MAMMOGRAPHY, HM PAP SMEAR, Basic metabolic panel, CBC with Differential, Hepatic function panel, Lipid panel, TSH, T4, free, T3, free, Thyroid antibodies, Sedimentation rate  Pain in both  feet  Adjustment disorder with depressed mood - hx of same    has many obligations  disc strategies poss CBT  etc exercise as possible  Check lab today consider seeing Dr. Darrick Penna or sports medicine in regard to her feet activity level consider seeing her back surgeon if persistent progressive leg symptoms.   Agree with seeing counselor increase the Zoloft at present and followup in 3-4 weeks.   On entry form    she felt considered herself in poor health.  I do not think she is on access high-risk blood clotting as she has had pregnancies OCPs and no history of same. No reason to do further workup. Do not think a tingling  In her leg is from blood clot -Patient advised to return or notify health care team  if symptoms worsen or persist or new concerns arise.  Patient Instructions  Ok to give time   For the   Tingling goes away.     Consider    Prednisone and referral    To   Your Back specialist.      I suggest you see Dr Doristine Church [place] [facility]) [place]  About the feet ankle etc in the meantime.   Increase zoloft  to 100 mg per  Day.     Agree with counseling.   Labs to r/o  Metabolic problems   ROV in  3-4 weeks.       Neta Mends. Panosh M.D.

## 2012-11-29 ENCOUNTER — Encounter: Payer: Self-pay | Admitting: Family Medicine

## 2012-12-26 ENCOUNTER — Ambulatory Visit (INDEPENDENT_AMBULATORY_CARE_PROVIDER_SITE_OTHER): Payer: 59 | Admitting: Internal Medicine

## 2012-12-26 ENCOUNTER — Encounter: Payer: Self-pay | Admitting: Internal Medicine

## 2012-12-26 VITALS — BP 110/80 | HR 78 | Temp 98.4°F | Wt 236.0 lb

## 2012-12-26 DIAGNOSIS — F4321 Adjustment disorder with depressed mood: Secondary | ICD-10-CM

## 2012-12-26 DIAGNOSIS — L918 Other hypertrophic disorders of the skin: Secondary | ICD-10-CM

## 2012-12-26 DIAGNOSIS — L919 Hypertrophic disorder of the skin, unspecified: Secondary | ICD-10-CM

## 2012-12-26 DIAGNOSIS — L909 Atrophic disorder of skin, unspecified: Secondary | ICD-10-CM

## 2012-12-26 MED ORDER — SERTRALINE HCL 100 MG PO TABS: 100.0000 mg | ORAL_TABLET | Freq: Every day | ORAL | Status: DC

## 2012-12-26 NOTE — Patient Instructions (Addendum)
Continue same  Medication dose and check in 1 year or as needred

## 2012-12-26 NOTE — Progress Notes (Signed)
Chief Complaint  Patient presents with  . Follow-up    HPI: .Patient comes in today for follow up of  multiple medical problems.   Since her last visit her sciatica got better on its own and didn't need to take any other action   Increase zoloft to 100 mg and seems to be working fine . Is much calm her unresponsive like to stay on this dose no significant side effect   Had strep throat  treated with penicillin is better  Is a question about possible skin tags under the breast area irritated at times discuss how to get them to go away    ROS: See pertinent positives and negatives per HPI.  Past Medical History  Diagnosis Date  . AMA (advanced maternal age) multigravida 35+   . Depression   . Other ankle sprain and strain     achilles rupture  . Goiter, unspecified   . History of chicken pox   . 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 Ruptured lumbar disc   . Hx of varicella   . Allergy     Family History  Problem Relation Age of Onset  . Cancer Mother     rare blood cancer  . Stroke Mother   . Heart failure Mother   . Cancer Father     liver also kidney  . Hypertension Father   . Nephrolithiasis Father   . Diabetes Father   . COPD Father   . Thyroid disease Maternal Aunt   . Arthritis Maternal Grandmother     rheumatoid  . Heart disease Maternal Grandfather   . Cancer Maternal Grandfather     stomach    History   Social History  . Marital Status: Married    Spouse Name: N/A    Number of Children: N/A  . Years of Education: N/A   Social History Main Topics  . Smoking status: Never Smoker   . Smokeless tobacco: Never Used  . Alcohol Use: No  . Drug Use: No  . Sexually Active: Yes     Comment: tubal if CS   Other Topics Concern  . None   Social History Narrative   Usually receives 6 hours of sleep at night   4 people living in the home. No pets   Married 2 young children   Bachelors degree [employer] 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       Originally from [place] increase her area for 20 years   G4P2   Social etoh no tobacco no ets FA     Outpatient Encounter Prescriptions as of 12/26/2012  Medication Sig Dispense Refill  . [DISCONTINUED] sertraline (ZOLOFT) 25 MG tablet Take 25 mg by mouth daily.       . sertraline (ZOLOFT) 100 MG tablet Take 1 tablet (100 mg total) by mouth daily.  90 tablet  3   No facility-administered encounter medications on file as of 12/26/2012.    EXAM:  BP 110/80  Pulse 78  Temp(Src) 98.4 F (36.9 C) (Oral)  Wt 236 lb (107.049 kg)  BMI 35.12 kg/m2  SpO2 98%  LMP 12/05/2012  Body mass index is 35.12 kg/(m^2).  GENERAL: vitals reviewed and listed above, alert, oriented, appears well hydrated and in no acute distress  HEENT: atraumatic, conjunctiva  clear, no obvious abnormalities on inspection of external nose and ears  Skin under breast multiple fine bumps that look like early skin tags no warts or molluscum noted.  MS: moves all extremities without noticeable focal  abnormality  PSYCH: pleasant and cooperative, no obvious depression or anxiety Lab Results  Component Value Date   WBC 7.4 11/22/2012   HGB 14.2 11/22/2012   HCT 42.4 11/22/2012   PLT 206.0 11/22/2012   GLUCOSE 86 11/22/2012   CHOL 160 11/22/2012   TRIG 104.0 11/22/2012   HDL 49.00 11/22/2012   LDLCALC 90 11/22/2012   ALT 15 11/22/2012   AST 17 11/22/2012   NA 143 11/22/2012   K 3.9 11/22/2012   CL 107 11/22/2012   CREATININE 0.8 11/22/2012   BUN 12 11/22/2012   CO2 24 11/22/2012   TSH 1.11 11/22/2012   Labs reviewed she received a copy in the mail ASSESSMENT AND PLAN:  Discussed the following assessment and plan:  Adjustment disorder with depressed mood - Much improved continue on 100 mg a day she will see her GYN in ROM followup 1 year or earlier if needed  Cutaneous skin tags - Conservative approach options of removal of significant for numerous.  -Patient advised to return or notify health care team  if symptoms  worsen or persist or new concerns arise.  Patient Instructions  Continue same  Medication dose and check in 1 year or as needred      Burna Mortimer K. Panosh M.D. Health Maintenance  Topic Date Due  . Tetanus/tdap  07/16/1989  . Influenza Vaccine  02/12/2013  . Pap Smear  09/13/2015   Health Maintenance Review Had tdap  In preganacy .  }

## 2013-04-03 ENCOUNTER — Telehealth: Payer: Self-pay | Admitting: Internal Medicine

## 2013-04-03 NOTE — Telephone Encounter (Signed)
Pt would like to talk with nurse concerning getting a specialized blood test. Pt would not elaborate

## 2013-04-04 NOTE — Telephone Encounter (Signed)
It would be best for her to get any labs or evaluations regarding her family members clotting  Evluations. And then see hematology as a consult.  Which labs should be done may be  best directed by family and  and her personal history

## 2013-04-04 NOTE — Telephone Encounter (Signed)
Spoke to the pt.  She stated she would like a blood test for clotting.  Father recently passed away from a blood clot.  Uncle has one now.  Both grandparents have passed from blood clot as well.  Her uncle's doctor advised her to have testing done since it seems to be hereditary.  She is traveling today.  Would like a message left on her machine.  Please advise.  Thanks!

## 2013-04-06 ENCOUNTER — Other Ambulatory Visit: Payer: Self-pay | Admitting: Family Medicine

## 2013-04-06 DIAGNOSIS — Z832 Family history of diseases of the blood and blood-forming organs and certain disorders involving the immune mechanism: Secondary | ICD-10-CM

## 2013-04-06 NOTE — Telephone Encounter (Signed)
Patient agreed to go ahead with referral to hematology.  Order placed in the system.

## 2013-04-11 ENCOUNTER — Telehealth: Payer: Self-pay | Admitting: Hematology and Oncology

## 2013-04-11 NOTE — Telephone Encounter (Signed)
S/W PT IN REF TO NP APPT. ON 04/30/13@9 :30 DX-FAMILY HISTORY OF CLOTTING DISORDER MAILED NP PACKET

## 2013-04-11 NOTE — Telephone Encounter (Signed)
C/D 04/11/13 for appt. 11/17

## 2013-04-30 ENCOUNTER — Ambulatory Visit: Payer: 59

## 2013-04-30 ENCOUNTER — Other Ambulatory Visit: Payer: 59 | Admitting: Lab

## 2013-04-30 ENCOUNTER — Ambulatory Visit: Payer: 59 | Admitting: Hematology and Oncology

## 2013-05-22 ENCOUNTER — Encounter: Payer: Self-pay | Admitting: Hematology and Oncology

## 2013-05-22 ENCOUNTER — Encounter (INDEPENDENT_AMBULATORY_CARE_PROVIDER_SITE_OTHER): Payer: Self-pay

## 2013-05-22 ENCOUNTER — Ambulatory Visit (HOSPITAL_BASED_OUTPATIENT_CLINIC_OR_DEPARTMENT_OTHER): Payer: 59 | Admitting: Hematology and Oncology

## 2013-05-22 ENCOUNTER — Ambulatory Visit: Payer: 59

## 2013-05-22 VITALS — BP 137/79 | HR 83 | Temp 98.6°F | Resp 18 | Ht 68.75 in | Wt 247.4 lb

## 2013-05-22 DIAGNOSIS — Z832 Family history of diseases of the blood and blood-forming organs and certain disorders involving the immune mechanism: Secondary | ICD-10-CM

## 2013-05-22 DIAGNOSIS — Z8249 Family history of ischemic heart disease and other diseases of the circulatory system: Secondary | ICD-10-CM

## 2013-05-22 HISTORY — DX: Family history of ischemic heart disease and other diseases of the circulatory system: Z82.49

## 2013-05-22 NOTE — Progress Notes (Signed)
[facility] CONSULT NOTE  Patient Care Team: Madelin Headings, MD as PCP - General (Internal Medicine) Serita Kyle, MD as Attending Physician (Obstetrics and Gynecology)  CHIEF COMPLAINTS/PURPOSE OF CONSULTATION:  Family history of blood clots  HISTORY OF PRESENTING ILLNESS:  Sharon Myers 42 y.o. female is here because of family history of blood clot. The patient has cell was never diagnosed with DVT.  She denies lower extremity swelling, warmth, tenderness & erythema.  She denies recent chest pain on exertion, shortness of breath on minimal exertion, pre-syncopal episodes, hemoptysis, or palpitation. She denies recent history of trauma, long distance travel, dehydration, recent surgery, smoking or prolonged immobilization. She had no prior history or diagnosis of cancer. Her age appropriate screening programs are up-to-date. She had prior surgeries before and never had perioperative thromboembolic events. For example, she had back surgery in 2007. In the same year, she had ruptured Achilles tendon on the left leg and was immobilized for one week before surgery. After surgery, she have to wear a brace for several weeks. She never developed postoperative DVT. The patient had been exposed to birth control pills for almost 10 years.  The patient had been pregnant before and denies history of peripartum thromboembolic event   She had extensive family history of blood clot.  Her grandfather had stomach cancer and developed blood clot. Her paternal grandmother also had history of blood clot. Her paternal uncle had atrial fibrillation and doing cardioversion was found to have a clot in his heart. Her father who had history of peripheral vascular disease, hypertension, diabetes and history of smoking developed recurrent arterial blood clot but subsequently died of pulmonary embolism. MEDICAL HISTORY:  Past Medical History  Diagnosis Date  . AMA (advanced maternal age)  multigravida 35+   . Depression   . Other ankle sprain and strain     achilles rupture  . Goiter, unspecified   . History of chicken pox   . 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 Ruptured lumbar disc   . Hx of varicella   . Allergy     SURGICAL HISTORY: Past Surgical History  Procedure Laterality Date  . Back surgery      hirsch  . Achilles tendon repair      Left softball injury Dr. Lestine Box  . Skin graft      SOCIAL HISTORY: History   Social History  . Marital Status: Married    Spouse Name: N/A    Number of Children: N/A  . Years of Education: N/A   Occupational History  . Not on file.   Social History Main Topics  . Smoking status: Never Smoker   . Smokeless tobacco: Never Used  . Alcohol Use: No  . Drug Use: No  . Sexual Activity: Yes     Comment: tubal if CS   Other Topics Concern  . Not on file   Social History Narrative   Usually receives 6 hours of sleep at night   4 people living in the home. No pets   Married 2 young children   Bachelors degree [employer] 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      Originally from [place] increase her area for 20 years   G4P2   Social etoh no tobacco no ets FA     FAMILY HISTORY: Family History  Problem Relation Age of Onset  . Cancer Mother     rare blood cancer  . Stroke Mother   .  Heart failure Mother   . Hypertension Father   . Nephrolithiasis Father   . Diabetes Father   . COPD Father   . Cancer Father     kidney  . Thyroid disease Maternal Aunt   . Arthritis Maternal Grandmother     rheumatoid  . Heart disease Maternal Grandfather   . Cancer Maternal Grandfather     stomach  . Cancer Paternal Grandfather     stomach ca    ALLERGIES:  has No Known Allergies.  MEDICATIONS:  Current Outpatient Prescriptions  Medication Sig Dispense Refill  . sertraline (ZOLOFT) 100 MG tablet Take 1 tablet (100 mg total) by mouth daily.  90 tablet  3   No current facility-administered medications  for this visit.    REVIEW OF SYSTEMS:   Constitutional: Denies fevers, chills or abnormal night sweats Eyes: Denies blurriness of vision, double vision or watery eyes Ears, nose, mouth, throat, and face: Denies mucositis or sore throat Respiratory: Denies cough, dyspnea or wheezes Cardiovascular: Denies palpitation, chest discomfort or lower extremity swelling Gastrointestinal:  Denies nausea, heartburn or change in bowel habits Skin: Denies abnormal skin rashes Lymphatics: Denies new lymphadenopathy or easy bruising Neurological:Denies numbness, tingling or new weaknesses Behavioral/Psych: Mood is stable, no new changes  All other systems were reviewed with the patient and are negative.  PHYSICAL EXAMINATION: ECOG PERFORMANCE STATUS: 0 - Asymptomatic  Filed Vitals:   05/22/13 1001  BP: 137/79  Pulse: 83  Temp: 98.6 F (37 C)  Resp: 18   Filed Weights   05/22/13 1001  Weight: 247 lb 6.4 oz (112.22 kg)    GENERAL:alert, no distress and comfortable. The patient is morbidly SKIN: skin color, texture, turgor are normal, no rashes or significant lesions EYES: normal, conjunctiva are pink and non-injected, sclera clear OROPHARYNX:no exudate, no erythema and lips, buccal mucosa, and tongue normal  NECK: supple, thyroid normal size, non-tender, without nodularity LYMPH:  no palpable lymphadenopathy in the cervical, axillary or inguinal LUNGS: clear to auscultation and percussion with normal breathing effort HEART: regular rate & rhythm and no murmurs and no lower extremity edema ABDOMEN:abdomen soft, non-tender and normal bowel sounds Musculoskeletal:no cyanosis of digits and no clubbing  PSYCH: alert & oriented x 3 with fluent speech NEURO: no focal motor/sensory deficits  LABORATORY DATA:  I have reviewed the data as listed The patient have extensive workup in the past including negative testing for antithrombin III deficiency, anticardiolipin antibodies, factor V Leiden  mutation, lupus anticoagulant and and prothrombin gene mutation.  ASSESSMENT:  Family history of DVT/PE  PLAN:  I reviewed with the patient about the plan for care  She have extensive thrombophilia workup in the past that were all tested negative. Just because of family history of thromboembolism does not mean she would be at risk of blood clot. She had numerous surgical challenges, exposure to both control pills and pregnancy and never developed DVT. I reassured the patient.  Finally, at the end of our consultation today, I reinforced the importance of preventive strategies such as avoiding hormonal supplement, avoiding cigarette smoking, keeping up-to-date with screening programs for early cancer detection, frequent ambulation for long distance travel and aggressive DVT prophylaxis in all surgical settings.  I have not made a return appointment for the patient to come back.   All questions were answered. The patient knows to call the clinic with any problems, questions or concerns. I spent 40 minutes counseling the patient face to face. The total time spent in the appointment  was 55 minutes and more than 50% was on counseling.     Drue Harr, MD 05/22/2013 1:53 PM

## 2013-05-22 NOTE — Progress Notes (Signed)
Primary  -  Dr. Berniece Andreas. Cell phone    [phone]    OK to leave messages.

## 2013-05-22 NOTE — Progress Notes (Signed)
Checked in new pt with no financial concerns. °

## 2013-08-28 ENCOUNTER — Ambulatory Visit: Payer: 59 | Admitting: Internal Medicine

## 2013-08-29 ENCOUNTER — Ambulatory Visit: Payer: 59 | Admitting: Internal Medicine

## 2013-08-29 ENCOUNTER — Ambulatory Visit (INDEPENDENT_AMBULATORY_CARE_PROVIDER_SITE_OTHER): Payer: 59 | Admitting: Internal Medicine

## 2013-08-29 ENCOUNTER — Encounter: Payer: Self-pay | Admitting: Internal Medicine

## 2013-08-29 VITALS — BP 122/90 | Temp 98.0°F | Ht 68.75 in | Wt 252.0 lb

## 2013-08-29 DIAGNOSIS — Z820 Family history of epilepsy and other diseases of the nervous system: Secondary | ICD-10-CM

## 2013-08-29 DIAGNOSIS — R0989 Other specified symptoms and signs involving the circulatory and respiratory systems: Secondary | ICD-10-CM

## 2013-08-29 DIAGNOSIS — R0609 Other forms of dyspnea: Secondary | ICD-10-CM

## 2013-08-29 DIAGNOSIS — R0683 Snoring: Secondary | ICD-10-CM | POA: Insufficient documentation

## 2013-08-29 DIAGNOSIS — R41840 Attention and concentration deficit: Secondary | ICD-10-CM

## 2013-08-29 DIAGNOSIS — Z8489 Family history of other specified conditions: Secondary | ICD-10-CM

## 2013-08-29 NOTE — Progress Notes (Signed)
Chief Complaint  Patient presents with  . Unable to focus    Pt is not able to focus at work and finds herself doing everything other than what she needs to be doing.    HPI: Patient comes in todayfor  new problem evaluation. Began new job   Same company  works at him but is having a hard time organizing and concentrating her mind gets distracted. Has always had that tendency but has become more problematic now and is worried about her job. Has to manage a lot and hard  Sinking teeth in to it  . Task list and  Gets distracted and side ways on same  things.  Making her panicking bothering her driving . Always some distraction  .  Conversations issues  Played softball and disturbed .  c  student in school  Never studied not aware of a family trait of the same Doesn't feel depressed at this time .  Zoloft has helped her with this. Asks if there is anything that can help her concentrate. Works from [employer].  Gained weight . And that certainly bothers her Sleep :  11 - 6 30   Take nap in day .  Afternoon . 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 caffeine am.  No exercise  ROS: See pertinent positives and negatives per HPI. Snoring   No apnea sx. Father had sleep apnea.  No family hx aware  Parents are deceased . Negative family history diagnosed ADD or major psychiatric problem. Has 2 children at home age 3 and 3 they do go to school during the day or daycare.  Past Medical History  Diagnosis Date  . AMA (advanced maternal age) multigravida 12+   . Depression   . Other ankle sprain and strain     achilles rupture  . Goiter, unspecified   . History of chicken pox   . 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 Ruptured lumbar disc   . Hx of varicella   . Allergy   . Family history of blood clots 05/22/2013    Family History  Problem Relation  Age of Onset  . Cancer Mother     rare blood cancer  . Stroke Mother   . Heart failure Mother   . Hypertension Father   . Nephrolithiasis Father   . Diabetes Father   . COPD Father   . Cancer Father     kidney  . Thyroid disease Maternal Aunt   . Arthritis Maternal Grandmother     rheumatoid  . Heart disease Maternal Grandfather   . Cancer Maternal Grandfather     stomach  . Cancer Paternal Grandfather     stomach ca    History   Social History  . Marital Status: Married    Spouse Name: N/A    Number of Children: N/A  . Years of Education: N/A   Social History Main Topics  . Smoking status: Never Smoker   . Smokeless tobacco: Never Used  . Alcohol Use: No  . Drug Use: No  . Sexual Activity: Yes     Comment: tubal if CS   Other Topics Concern  . None   Social History Narrative   Usually receives 6 hours of sleep at night   4 people living in the  home. No pets   Married 2 young children   Bachelors degree [employer] 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      Originally from [place] increase her area for 20 years   [place] etoh no tobacco no ets FA     Outpatient Encounter Prescriptions as of 08/29/2013  Medication Sig  . sertraline (ZOLOFT) 100 MG tablet Take 1 tablet (100 mg total) by mouth daily.    EXAM:  BP 122/90  Temp(Src) 98 F (36.7 C) (Oral)  Ht 5' 8.75" (1.746 m)  Wt 252 lb (114.306 kg)  BMI 37.50 kg/m2  Body mass index is 37.5 kg/(m^2).  GENERAL: vitals reviewed and listed above, alert, oriented, appears well hydrated and in no acute distress has a mild cough intermittently no tremor noted some increased motor activity speech is normal nonpressured no depressive symptoms. HEENT: atraumatic, conjunctiva  clear, no obvious abnormalities on inspection of external nose and ears NECK: no obvious masses on inspection palpation  LUNGS: clear to auscultation bilaterally, no wheezes, rales or rhonchi, good air movement CV: HRRR, no clubbing  cyanosis  MS: moves all extremities without noticeable focal  abnormality PSYCH: pleasant and cooperative,  slightly stressed. Lab Results  Component Value Date   WBC 7.4 11/22/2012   HGB 14.2 11/22/2012   HCT 42.4 11/22/2012   PLT 206.0 11/22/2012   GLUCOSE 86 11/22/2012   CHOL 160 11/22/2012   TRIG 104.0 11/22/2012   HDL 49.00 11/22/2012   LDLCALC 90 11/22/2012   ALT 15 11/22/2012   AST 17 11/22/2012   NA 143 11/22/2012   K 3.9 11/22/2012   CL 107 11/22/2012   CREATININE 0.8 11/22/2012   BUN 12 11/22/2012   CO2 24 11/22/2012   TSH 1.11 11/22/2012    ASSESSMENT AND PLAN:  Discussed the following assessment and plan:  Disturbed concentration - Consideration ADD, sleep problem organization difficulty stress etc. ADHD testing evaluation behavioral healthto help delineate problem  Snoring  Family history of sleep apnea - father  Discussed concentration and affect with sleep alcohol mood attentional difficulties organizational skills et Ronney Asters. Discussed strategies in the short run like not checking her e-mail except at certain times minimal I seen her more sleep at night decreasing the alcohol increasing exercise in the short run would have her come back after report is available to me to review -Patient advised to return or notify health care team  if symptoms worsen ,persist or new concerns arise.  Patient Instructions  Back off of the alcohol on week nights. Sleep may be better,and help focusing .  Track your sleep an awareness of hours.  And then   Organize days . As we said  Get the testing evaluation for ADD/ADHD  For me so we can review best course of action.  Uncertain if this could be add and or sleep issue or both .    Standley Brooking. Taniqua Issa M.D.  Pre visit review using our clinic review tool, if applicable. No additional management support is needed unless otherwise documented below in the visit note. Total visit 32mins > 50% spent counseling and coordinating care

## 2013-08-29 NOTE — Patient Instructions (Signed)
Back off of the alcohol on week nights. Sleep may be better,and help focusing .  Track your sleep an awareness of hours.  And then   Organize days . As we said  Get the testing evaluation for ADD/ADHD  For me so we can review best course of action.  Uncertain if this could be add and or sleep issue or both .

## 2013-09-07 ENCOUNTER — Ambulatory Visit (INDEPENDENT_AMBULATORY_CARE_PROVIDER_SITE_OTHER): Payer: 59 | Admitting: Licensed Clinical Social Worker

## 2013-09-07 DIAGNOSIS — F4323 Adjustment disorder with mixed anxiety and depressed mood: Secondary | ICD-10-CM

## 2013-09-13 ENCOUNTER — Ambulatory Visit (INDEPENDENT_AMBULATORY_CARE_PROVIDER_SITE_OTHER): Payer: 59 | Admitting: Licensed Clinical Social Worker

## 2013-09-13 DIAGNOSIS — F4323 Adjustment disorder with mixed anxiety and depressed mood: Secondary | ICD-10-CM

## 2013-10-02 ENCOUNTER — Ambulatory Visit: Payer: 59 | Admitting: Licensed Clinical Social Worker

## 2013-12-07 ENCOUNTER — Ambulatory Visit (INDEPENDENT_AMBULATORY_CARE_PROVIDER_SITE_OTHER): Payer: 59 | Admitting: Internal Medicine

## 2013-12-07 ENCOUNTER — Encounter: Payer: Self-pay | Admitting: Internal Medicine

## 2013-12-07 ENCOUNTER — Ambulatory Visit (INDEPENDENT_AMBULATORY_CARE_PROVIDER_SITE_OTHER)
Admission: RE | Admit: 2013-12-07 | Discharge: 2013-12-07 | Disposition: A | Discharge status: Home / Self Care | Payer: 59 | Source: Ambulatory Visit | Attending: Internal Medicine | Admitting: Internal Medicine

## 2013-12-07 VITALS — BP 146/96 | Temp 98.6°F | Wt 250.0 lb

## 2013-12-07 DIAGNOSIS — M79609 Pain in unspecified limb: Secondary | ICD-10-CM

## 2013-12-07 DIAGNOSIS — L539 Erythematous condition, unspecified: Secondary | ICD-10-CM

## 2013-12-07 DIAGNOSIS — M79671 Pain in right foot: Secondary | ICD-10-CM

## 2013-12-07 MED ORDER — DOXYCYCLINE HYCLATE 100 MG PO CAPS: 100.0000 mg | ORAL_CAPSULE | Freq: Two times a day (BID) | ORAL | Status: DC

## 2013-12-07 NOTE — Progress Notes (Signed)
Pre visit review using our clinic review tool, if applicable. No additional management support is needed unless otherwise documented below in the visit note.  Chief Complaint  Patient presents with  . Right Foot Pain and Swelling    Started yesterday morning.      HPI: Patient comes in today for SDA for  new problem evaluation. Acute onset yesterday morning of soreness and swelling on the top of her right foot without associated injury fever changing shoes or any other trauma. Today it is a bit more swollen and tender pain when she actively dorsiflexes her toes but less with passive. No history of gout arthritis systemic symptoms GU GI rash or diarrhea. \ibuprofen some help x 1  Had a pedicure week and a half ago with some irritation around one of her toenails. Other than that not a lot of trauma to her feet. ROS: See pertinent positives and negatives per HPI.  Past Medical History  Diagnosis Date  . AMA (advanced maternal age) multigravida 37+   . Depression   . Other ankle sprain and strain     achilles rupture  . Goiter, unspecified   . History of chicken pox   . 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 Ruptured lumbar disc   . Hx of varicella   . Allergy   . Family history of blood clots 05/22/2013    Family History  Problem Relation Age of Onset  . Cancer Mother     rare blood cancer  . Stroke Mother   . Heart failure Mother   . Hypertension Father   . Nephrolithiasis Father   . Diabetes Father   . COPD Father   . Cancer Father     kidney  . Thyroid disease Maternal Aunt   . Arthritis Maternal Grandmother     rheumatoid  . Heart disease Maternal Grandfather   . Cancer Maternal Grandfather     stomach  . Cancer Paternal Grandfather     stomach ca    History   Social History  . Marital Status: Married    Spouse Name: N/A    Number of Children: N/A  . Years of Education: N/A   Social History Main Topics  . Smoking status: Never Smoker   .  Smokeless tobacco: Never Used  . Alcohol Use: No  . Drug Use: No  . Sexual Activity: Yes     Comment: tubal if CS   Other Topics Concern  . None   Social History Narrative   Usually receives 6 hours of sleep at night   4 people living in the home. No pets   Married 2 young children   Bachelors degree [employer] 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      Originally from [place] increase her area for 20 years   G4P2   Social etoh no tobacco no ets FA     Outpatient Encounter Prescriptions as of 12/07/2013  Medication Sig  . sertraline (ZOLOFT) 100 MG tablet Take 1 tablet (100 mg total) by mouth daily.  [name] Kitchen doxycycline (VIBRAMYCIN) 100 MG capsule Take 1 capsule (100 mg total) by mouth 2 (two) times daily.    EXAM:  BP 146/96  Temp(Src) 98.6 F (37 C) (Oral)  Wt 250 lb (113.399 kg)  LMP 11/16/2013  Body mass index is 37.2 kg/(m^2).  GENERAL: vitals reviewed and listed above, alert, oriented, appears well hydrated and in no acute distress with her right foot raised for  comfort HEENT: atraumatic, conjunctiva  clear, no obvious abnormalities on inspection of external nose and ears LUNGS: clear to auscultation bilaterally, no wheezes, rales or rhonchi, good air movement MS: Right foot with diffuse pink swelling and edema on the top dorsum of the foot not related to any joint no streaking no crepitus tender worse with movement of the middle toes dorsi flexed active more than passive. Sole of foot intact but a few excoriations blister over the sole metatarsal she states was from water aerobics but noninfected looking. Neurovascular appears intact she walks with a limp. Ankle stable slightly full PSYCH: pleasant and cooperative, no obvious depression or anxiety  ASSESSMENT AND PLAN:  Discussed the following assessment and plan:  Foot pain, right - Plan: DG Foot Complete Right  Redness of skin - Plan: DG Foot Complete Right Uncertain cause consider tendinitis early infection history not  giving [place] a clue. Cover for soft tissue infection anti-inflammatory for pain elevation check x-ray today rule out fracture foreign body et Ronney Asters. Followup next week unless better or GYN can check it. Activity as tolerated relative rest note she states she can drive if she doesn't flex her foot. -Patient advised to return or notify health care team  if symptoms worsen ,persist or new concerns arise.  Patient Instructions  Uncertain cause of swelling  Tendinitis possible but is would rx for poss skin infection  And get x ray  Today . Antibiotic and 2 aleve twice a day for pain   If needed elevation  Fu next week if not better or contact oncall service if fever worsening   .   Standley Brooking. Davis Vannatter M.D.

## 2013-12-07 NOTE — Patient Instructions (Signed)
Uncertain cause of swelling  Tendinitis possible but is would rx for poss skin infection  And get x ray  Today . Antibiotic and 2 aleve twice a day for pain   If needed elevation  Fu next week if not better or contact oncall service if fever worsening   .

## 2013-12-10 ENCOUNTER — Other Ambulatory Visit: Payer: Self-pay | Admitting: Internal Medicine

## 2013-12-11 NOTE — Telephone Encounter (Signed)
Ok x 6 months ov before runs out of refills

## 2013-12-12 NOTE — Telephone Encounter (Signed)
Sent to the pharmacy by e-scribe. 

## 2014-04-15 ENCOUNTER — Encounter: Payer: Self-pay | Admitting: Internal Medicine

## 2014-11-09 ENCOUNTER — Other Ambulatory Visit: Payer: Self-pay | Admitting: Internal Medicine

## 2014-11-13 ENCOUNTER — Telehealth: Payer: Self-pay

## 2014-11-13 NOTE — Telephone Encounter (Signed)
[facility] - [place], [place] - [address]: sertraline (ZOLOFT) 100 MG tablet

## 2014-11-13 NOTE — Telephone Encounter (Signed)
Refill request has been sent to [facility].  Waiting on a response.

## 2014-11-13 NOTE — Telephone Encounter (Signed)
Ok to refill x 1 after makes yearly appt  For med evaluation to be completed before runs out of this refill.

## 2014-11-14 NOTE — Telephone Encounter (Signed)
Pt asked for yearly physical and lab work.  Pt scheduled and medication sent to the pharmacy.

## 2014-12-27 ENCOUNTER — Other Ambulatory Visit: Payer: 59

## 2014-12-30 ENCOUNTER — Other Ambulatory Visit (INDEPENDENT_AMBULATORY_CARE_PROVIDER_SITE_OTHER): Payer: 59

## 2014-12-30 DIAGNOSIS — Z0000 Encounter for general adult medical examination without abnormal findings: Secondary | ICD-10-CM

## 2014-12-30 LAB — BASIC METABOLIC PANEL
BUN: 12 mg/dL (ref 6–23)
CO2: 28 mEq/L (ref 19–32)
Calcium: 9.2 mg/dL (ref 8.4–10.5)
Chloride: 103 mEq/L (ref 96–112)
Creatinine, Ser: 0.67 mg/dL (ref 0.40–1.20)
GFR: 101.41 mL/min (ref >60.00)
Glucose, Bld: 99 mg/dL (ref 70–99)
Potassium: 3.6 mEq/L (ref 3.5–5.1)
Sodium: 140 mEq/L (ref 135–145)

## 2014-12-30 LAB — CBC WITH DIFFERENTIAL/PLATELET
BASOS ABS: 0 10*3/uL (ref 0.0–0.1)
Basophils Relative: 0.3 % (ref 0.0–3.0)
EOS ABS: 0.2 10*3/uL (ref 0.0–0.7)
Eosinophils Relative: 3 % (ref 0.0–5.0)
HEMATOCRIT: 41.5 % (ref 36.0–46.0)
Hemoglobin: 13.8 g/dL (ref 12.0–15.0)
LYMPHS ABS: 2.3 10*3/uL (ref 0.7–4.0)
LYMPHS PCT: 33.8 % (ref 12.0–46.0)
MCHC: 33.4 g/dL (ref 30.0–36.0)
MCV: 88.4 fl (ref 78.0–100.0)
MONO ABS: 0.6 10*3/uL (ref 0.1–1.0)
Monocytes Relative: 9.1 % (ref 3.0–12.0)
Neutro Abs: 3.7 10*3/uL (ref 1.4–7.7)
Neutrophils Relative %: 53.8 % (ref 43.0–77.0)
PLATELETS: 215 10*3/uL (ref 150.0–400.0)
RBC: 4.69 Mil/uL (ref 3.87–5.11)
RDW: 13.5 % (ref 11.5–15.5)
WBC: 6.9 10*3/uL (ref 4.0–10.5)

## 2014-12-30 LAB — HEPATIC FUNCTION PANEL
ALT: 15 U/L (ref 0–35)
AST: 14 U/L (ref 0–37)
Albumin: 4 g/dL (ref 3.5–5.2)
Alkaline Phosphatase: 71 U/L (ref 39–117)
Bilirubin, Direct: 0.1 mg/dL (ref 0.0–0.3)
TOTAL PROTEIN: 6.7 g/dL (ref 6.0–8.3)
Total Bilirubin: 0.6 mg/dL (ref 0.2–1.2)

## 2014-12-30 LAB — LIPID PANEL
CHOLESTEROL: 160 mg/dL (ref 0–200)
HDL: 49.4 mg/dL (ref >39.00)
LDL CALC: 92 mg/dL (ref 0–99)
NONHDL: 110.6
Total CHOL/HDL Ratio: 3
Triglycerides: 92 mg/dL (ref 0.0–149.0)
VLDL: 18.4 mg/dL (ref 0.0–40.0)

## 2014-12-30 LAB — TSH: TSH: 1.58 u[IU]/mL (ref 0.35–4.50)

## 2015-01-03 ENCOUNTER — Other Ambulatory Visit: Payer: Self-pay | Admitting: Family Medicine

## 2015-01-03 ENCOUNTER — Ambulatory Visit (INDEPENDENT_AMBULATORY_CARE_PROVIDER_SITE_OTHER): Payer: 59 | Admitting: Internal Medicine

## 2015-01-03 ENCOUNTER — Encounter: Payer: Self-pay | Admitting: Internal Medicine

## 2015-01-03 VITALS — BP 120/86 | Temp 98.2°F | Ht 68.5 in | Wt 256.4 lb

## 2015-01-03 DIAGNOSIS — Z79899 Other long term (current) drug therapy: Secondary | ICD-10-CM | POA: Diagnosis not present

## 2015-01-03 DIAGNOSIS — Z0000 Encounter for general adult medical examination without abnormal findings: Secondary | ICD-10-CM

## 2015-01-03 MED ORDER — SERTRALINE HCL 50 MG PO TABS: 50.0000 mg | ORAL_TABLET | Freq: Every day | ORAL | Status: DC

## 2015-01-03 NOTE — Patient Instructions (Signed)
  Healthy lifestyle includes : At least 150 minutes of exercise weeks  , weight at healthy levels, which is usually   BMI 19-25. Avoid trans fats and processed foods;  Increase fresh fruits and veges to 5 servings per day. And avoid sweet beverages including tea and juice. Mediterranean diet with olive oil and nuts have been noted to be heart and brain healthy . Avoid tobacco products . Limit  alcohol to  7 per week for women and 14 servings for men.  Get adequate sleep . Wear seat belts . Don't text and drive .   Continue on 50 mg sertaline per day will send in refills

## 2015-01-03 NOTE — Progress Notes (Signed)
Pre visit review using our clinic review tool, if applicable. No additional management support is needed unless otherwise documented below in the visit note.  Chief Complaint  Patient presents with  . Annual Exam    HPI: Patient  Sharon Myers  44 y.o. comes in today for Preventive Health Care visit  50 mg sertaline for the last 3 months cause of flattening on 100 mg  Doing well on this dose  Health Maintenance  Topic Date Due  . INFLUENZA VACCINE  01/13/2015  . PAP SMEAR  09/13/2015  . TETANUS/TDAP  01/29/2020  . HIV Screening  Completed   Health Maintenance Review LIFESTYLE:  Exercise:  Thinking about it  Tobacco/ETS:no Alcohol: less than 7 per week  Sugar beverages: 2 t large soda in am and one later hard to stop Sleep: 6-8 hours small children at home and works from home  Drug use: no  ROS: some plantar fasciitis and knee ache at times  GEN/ HEENT: No fever, significant weight changes sweats headaches vision problems hearing changes, CV/ PULM; No chest pain shortness of breath cough, syncope,edema  change in exercise tolerance. GI /GU: No adominal pain, vomiting, change in bowel habits. No blood in the stool. No significant GU symptoms. SKIN/HEME: ,no acute skin rashes suspicious lesions or bleeding. No lymphadenopathy, nodules, masses.  NEURO/ PSYCH:  No neurologic signs such as weakness numbness. No depression anxiety. IMM/ Allergy: No unusual infections.  Allergy .   REST of 12 system review negative except as per HPI   Past Medical History  Diagnosis Date  . AMA (advanced maternal age) multigravida 41+   . Depression   . Other ankle sprain and strain     achilles rupture  . Goiter, unspecified   . History of chicken pox   . 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 Ruptured lumbar disc   . Hx of varicella   . Allergy   . Family history of blood clots 05/22/2013    Past Surgical History  Procedure Laterality Date  . Back surgery      hirsch  .  Achilles tendon repair      Left softball injury Dr. [name]  . Skin graft      Family History  Problem Relation Age of Onset  . Cancer Mother     rare blood cancer  . Stroke Mother   . Heart failure Mother   . Hypertension Father   . Nephrolithiasis Father   . Diabetes Father   . COPD Father   . Cancer Father     kidney  . Thyroid disease Maternal Aunt   . Arthritis Maternal Grandmother     rheumatoid  . Heart disease Maternal Grandfather   . Cancer Maternal Grandfather     stomach  . Cancer Paternal Grandfather     stomach ca    History   Social History  . Marital Status: Married    Spouse Name: N/A  . Number of Children: N/A  . Years of Education: N/A   Social History Main Topics  . Smoking status: Never Smoker   . Smokeless tobacco: Never Used  . Alcohol Use: No  . Drug Use: No  . Sexual Activity: Yes     Comment: tubal if CS   Other Topics Concern  . None   Social History Narrative   Usually receives 6 hours of sleep at night   4 people living in the home. No pets   Married 2 young  children   Bachelors degree [employer]. 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 sertraline (ZOLOFT) 100 MG tablet Take 1 tablet by mouth  daily 90 tablet 0  . doxycycline (VIBRAMYCIN) 100 MG capsule Take 1 capsule (100 mg total) by mouth 2 (two) times daily. 20 capsule 0   No facility-administered medications prior to visit.     EXAM:  BP 120/86 mmHg  Temp(Src) 98.2 F (36.8 C) (Oral)  Ht 5' 8.5" (1.74 m)  Wt 256 lb 6.4 oz (116.302 kg)  BMI 38.41 kg/m2  Body mass index is 38.41 kg/(m^2).  Physical Exam: Vital signs reviewed VQM:GQQP is a well-developed well-nourished alert cooperative    who appearsr stated age in no acute distress.  HEENT: normocephalic atraumatic , Eyes: PERRL EOM's  full, conjunctiva clear, Nares: paten,t no deformity discharge or tenderness., Ears: no deformity EAC's clear TMs with normal landmarks. Mouth: clear OP, no lesions, edema.  Moist mucous membranes. Dentition in adequate repair. NECK: supple without masses, thyromegaly or bruits. CHEST/PULM:  Clear to auscultation and percussion breath sounds equal no wheeze , rales or rhonchi. No chest wall deformities or tenderness.Breast: normal by inspection . No dimpling, discharge, masses, tenderness or discharge  CV: PMI is nondisplaced, S1 S2 no gallops, murmurs, rubs. Peripheral pulses are full without delay.No JVD .  ABDOMEN: Bowel sounds normal nontender  No guard or rebound, no hepato splenomegal no CVA tenderness.  No hernia. Extremtities:  No clubbing cyanosis or edema, no acute joint swelling or redness no focal atrophy NEURO:  Oriented x3, cranial nerves 3-12 appear to be intact, no obvious focal weakness,gait within normal limits no abnormal reflexes or asymmetrical SKIN: No acute rashes normal turgor, color, no bruising or petechiae. PSYCH: Oriented, good eye contact, no obvious depression anxiety, cognition and judgment appear normal. LN: no cervical axillary inguinal adenopathy  Lab Results  Component Value Date   WBC 6.9 12/30/2014   HGB 13.8 12/30/2014   HCT 41.5 12/30/2014   PLT 215.0 12/30/2014   GLUCOSE 99 12/30/2014   CHOL 160 12/30/2014   TRIG 92.0 12/30/2014   HDL 49.40 12/30/2014   LDLCALC 92 12/30/2014   ALT 15 12/30/2014   AST 14 12/30/2014   NA 140 12/30/2014   K 3.6 12/30/2014   CL 103 12/30/2014   CREATININE 0.67 12/30/2014   BUN 12 12/30/2014   CO2 28 12/30/2014   TSH 1.58 12/30/2014    ASSESSMENT AND PLAN:  Discussed the following assessment and plan:  Visit for preventive health examination - healhty but at risk  lsi interfention disc tracking etc   Medication management  Patient Care Team: Burnis Medin, MD as PCP - General (Internal  Medicine) Servando Salina, MD as Attending Physician (Obstetrics and Gynecology) Patient Instructions   Healthy lifestyle includes : At least 150 minutes of exercise weeks  , weight at healthy levels, which is usually   BMI 19-25. Avoid trans fats and processed foods;  Increase fresh fruits and veges to 5 servings per day. And avoid sweet beverages including tea and juice. Mediterranean diet with olive oil and nuts have been noted to be heart and brain healthy . Avoid tobacco products . Limit  alcohol to  7 per week for women and 14 servings  for men.  Get adequate sleep . Wear seat belts . Don't text and drive .   Continue on 50 mg sertaline per day will send in refills      Wanda K. Panosh M.D.

## 2016-01-08 LAB — CBC AND DIFFERENTIAL
HCT: 36 % (ref 36–46)
Hemoglobin: 12.8 g/dL (ref 12.0–16.0)
Platelets: 231 10*3/uL (ref 150–399)
WBC: 5.9 10*3/mL

## 2016-01-08 LAB — LIPID PANEL
CHOLESTEROL: 168 mg/dL (ref 0–200)
HDL: 50 mg/dL (ref 35–70)
LDL Cholesterol: 100 mg/dL
TRIGLYCERIDES: 89 mg/dL (ref 40–160)

## 2016-01-08 LAB — HEPATIC FUNCTION PANEL
ALT: 19 U/L (ref 7–35)
AST: 17 U/L (ref 13–35)
Alkaline Phosphatase: 79 U/L (ref 25–125)
Bilirubin, Total: 0.4 mg/dL

## 2016-01-08 LAB — BASIC METABOLIC PANEL
BUN: 12 mg/dL (ref 4–21)
CREATININE: 0.8 mg/dL (ref 0.5–1.1)
Glucose: 113 mg/dL
POTASSIUM: 4.3 mmol/L (ref 3.4–5.3)
SODIUM: 141 mmol/L (ref 137–147)

## 2016-01-08 LAB — TSH: TSH: 1.51 u[IU]/mL (ref 0.41–5.90)

## 2016-01-08 LAB — VITAMIN D 25 HYDROXY (VIT D DEFICIENCY, FRACTURES): Vit D, 25-Hydroxy: 32.5

## 2016-01-08 LAB — HEMOGLOBIN A1C: HEMOGLOBIN A1C: 5.4

## 2016-07-13 ENCOUNTER — Ambulatory Visit (INDEPENDENT_AMBULATORY_CARE_PROVIDER_SITE_OTHER): Payer: 59 | Admitting: Internal Medicine

## 2016-07-13 ENCOUNTER — Encounter: Payer: Self-pay | Admitting: Internal Medicine

## 2016-07-13 VITALS — BP 138/88 | HR 70 | Temp 98.2°F | Wt 259.6 lb

## 2016-07-13 DIAGNOSIS — R0602 Shortness of breath: Secondary | ICD-10-CM | POA: Diagnosis not present

## 2016-07-13 DIAGNOSIS — M79671 Pain in right foot: Secondary | ICD-10-CM | POA: Diagnosis not present

## 2016-07-13 DIAGNOSIS — M79605 Pain in left leg: Secondary | ICD-10-CM

## 2016-07-13 DIAGNOSIS — R202 Paresthesia of skin: Secondary | ICD-10-CM

## 2016-07-13 DIAGNOSIS — M79604 Pain in right leg: Secondary | ICD-10-CM

## 2016-07-13 DIAGNOSIS — Z6838 Body mass index (BMI) 38.0-38.9, adult: Secondary | ICD-10-CM

## 2016-07-13 DIAGNOSIS — R200 Anesthesia of skin: Secondary | ICD-10-CM | POA: Diagnosis not present

## 2016-07-13 NOTE — Patient Instructions (Addendum)
Plan fasting labs   Get [place] copy of   Gyne labs at the same time.   Doesn't sound like a vascular  Problem at this time.  right arm  Could be compressive positional  sx and can do referral  To neurology .  if postural  Changes dont help Healthy weight loss may help .   Thinks about weight watchers     .   Modest weight  Loss can help also .  Consider     Sports medicine  Or foot referral .   EKG today . Normal     Exercising to Stay Healthy Introduction Exercising regularly is important. It has many health benefits, such as:  Improving your overall fitness, flexibility, and endurance.  Increasing your bone density.  Helping with weight control.  Decreasing your body fat.  Increasing your muscle strength.  Reducing stress and tension.  Improving your overall health. In order to become healthy and stay healthy, it is recommended that you do moderate-intensity and vigorous-intensity exercise. You can tell that you are exercising at a moderate intensity if you have a higher heart rate and faster breathing, but you are still able to hold a conversation. You can tell that you are exercising at a vigorous intensity if you are breathing much harder and faster and cannot hold a conversation while exercising. How often should I exercise? Choose an activity that you enjoy and set realistic goals. Your health care provider can help you to make an activity plan that works for you. Exercise regularly as directed by your health care provider. This may include:  Doing resistance training twice each week, such as:  Push-ups.  Sit-ups.  Lifting weights.  Using resistance bands.  Doing a given intensity of exercise for a given amount of time. Choose from these options:  150 minutes of moderate-intensity exercise every week.  75 minutes of vigorous-intensity exercise every week.  A mix of moderate-intensity and vigorous-intensity exercise every week. Children, pregnant women, people who  are out of shape, people who are overweight, and older adults may need to consult a health care provider for individual recommendations. If you have any sort of medical condition, be sure to consult your health care provider before starting a new exercise program. What are some exercise ideas? Some moderate-intensity exercise ideas include:  Walking at a rate of 1 mile in 15 minutes.  Biking.  Hiking.  Golfing.  Dancing. Some vigorous-intensity exercise ideas include:  Walking at a rate of at least 4.5 miles per hour.  Jogging or running at a rate of 5 miles per hour.  Biking at a rate of at least 10 miles per hour.  Lap swimming.  Roller-skating or in-line skating.  Cross-country skiing.  Vigorous competitive sports, such as football, basketball, and soccer.  Jumping rope.  Aerobic dancing. What are some everyday activities that can help me to get exercise?  Yard work, such as:  Psychologist, educational.  Raking and bagging leaves.  Washing and waxing your car.  Pushing a stroller.  Shoveling snow.  Gardening.  Washing windows or floors. How can I be more active in my day-to-day activities?  Use the stairs instead of the elevator.  Take a walk during your lunch break.  If you drive, park your car farther away from work or school.  If you take public transportation, get off one stop early and walk the rest of the way.  Make all of your phone calls while standing up and  walking around.  Get up, stretch, and walk around every 30 minutes throughout the day. What guidelines should I follow while exercising?  Do not exercise so much that you hurt yourself, feel dizzy, or get very short of breath.  Consult your health care provider before starting a new exercise program.  Wear comfortable clothes and shoes with good support.  Drink plenty of water while you exercise to prevent dehydration or heat stroke. Body water is lost during exercise and must be  replaced.  Work out until you breathe faster and your heart beats faster. This information is not intended to replace advice given to you by your health care provider. Make sure you discuss any questions you have with your health care provider. Document Released: 07/03/2010 Document Revised: 11/06/2015 Document Reviewed: 11/01/2013  2017 Elsevier

## 2016-07-13 NOTE — Progress Notes (Signed)
Chief Complaint  Patient presents with  . Numbness    hands, legs, pain in chest while taking deep breathe    HPI: Sharon Myers 46 y.o. comes in today for an acute visit because she is concerned of a number of symptoms of the number of months and wants to make sure she is okay. She is gaining weight is overweight has a family history of blood clots. She's had various symptoms going off and on over months. 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 At that time there was no chest pain shortness of breath related to her exercise.  She feels that her diet isn't that great    Likes  buterry and mayonnaise .  Aches in legs and shooting pain in right  Foot off an on  Limites her acitivy  When she tried   righ tlatera;  Legs aches   Mostly symmetrical .   No fever  .  No reg exercise.  Walking up  Stairs   Bothersome. Legs sx  out of shape .  Past hx of   Back surgery   Ruptured disc  Lower  Back 10 year.    Sometimes when at [facility]  And    Working   Right Arm and numbn  Fingers   And less arms   Mostly right handed  Night also. positinos no weakness   11-6  Snores a lot ? coul dhave osa  Has some shortness of breath can't catch her breath at times but not associated with exercise pain cough. She thinks she is out of shape and just wants to make sure nothing else needs to be checked out. She has seen her GYN in the last year and apparently had some laboratory studies done.  ROS: See pertinent positives and negatives per HPI. No syncope bleeding other neurologic symptoms or recent trauma.  Past Medical History:  Diagnosis Date  . Allergy   . AMA (advanced maternal age) multigravida 89+   . Depression   . Family history of blood clots 05/22/2013  . Goiter, unspecified   . History of chicken pox   . Hx of varicella   . Other ankle sprain and  strain    achilles rupture  . Ruptured lumbar disc   . 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 Cancer Mother     rare blood cancer  . Stroke Mother   . Heart failure Mother   . Hypertension Father   . Nephrolithiasis Father   . Diabetes Father   . COPD Father   . Cancer Father     kidney  . Thyroid disease Maternal Aunt   . Arthritis Maternal Grandmother     rheumatoid  . Heart disease Maternal Grandfather   . Cancer Maternal Grandfather     stomach  . Cancer Paternal Grandfather     stomach ca    Social History   Social History  . Marital status: Married    Spouse name: N/A  . Number of children: N/A  . Years of education: N/A   Social History Main Topics  . Smoking status: Never Smoker  . Smokeless tobacco: Never Used  . Alcohol use No  . Drug use: No  .  Sexual activity: Yes     Comment: tubal if CS   Other Topics Concern  . None   Social History Narrative   Usually receives 6 hours of sleep at night   4 people living in the home. 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 sertraline (ZOLOFT) 50 MG tablet Take 1 tablet (50 mg total) by mouth daily. 90 tablet 3   No facility-administered medications prior to visit.      EXAM:  BP 138/88 (BP Location: Left Arm, Patient Position: Sitting, Cuff Size: Normal)   Pulse 70   Temp 98.2 F (36.8 C) (Oral)   Wt 259 lb 9.6 oz (117.8 kg)   SpO2 98%   BMI 38.90 kg/m   Body mass index is 38.9 kg/m.  GENERAL: vitals reviewed and listed above, alert, oriented, appears well hydrated and in no acute distress HEENT: atraumatic, conjunctiva  clear, no obvious abnormalities on inspection of external nose and ears OP :  no lesion edema or exudate  NECK: no obvious masses on inspection palpation  LUNGS: clear to auscultation bilaterally, no wheezes, rales or rhonchi, good air movement CV: HRRR, no clubbing cyanosis or  peripheral edema nl cap refill  MS: moves all extremities without noticeable focal  Abnormality Right lateral foot along the fifth metatarsal might be diffusely tender but no specific lesions. Negative Homan no focal vascular symptoms. Pulses are equal. No fasciculations or active bruising.  PSYCH: pleasant and cooperative, no obvious depression or anxiety NEURO: oriented x 3 CN 3-12 appear intact. No focal muscle weakness or atrophy. DTRs symmetrical. Gait WNL.  Grossly non focal. No tremor or abnormal movement.  EKG shows normal sinus rhythm at bradycardia at about a 56 with normal intervals no acute findings QT ST. Reviewed with patient. Gave her a copy.  . ASSESSMENT AND PLAN:  Discussed the following assessment and plan:  Pain in both lower extremities - exercise induced musckle aches   that is symmetrical  - Plan: Basic metabolic panel, CBC with Differential/Platelet, Hemoglobin A1c, Hepatic function panel, TSH, T4, free, Lipid panel, Sedimentation rate, ANA, CK, Vitamin B12, Ferritin  Shortness of breath - sound like deconditioning  and no cv pulm at this time legs botehr her more than  breathing  - Plan: EKG [id number], Basic metabolic panel, CBC with Differential/Platelet, Hemoglobin A1c, Hepatic function panel, TSH, T4, free, Lipid panel, Sedimentation rate, ANA, CK, Vitamin B12, Ferritin  Numbness and tingling of right arm - Plan: Basic metabolic panel, CBC with Differential/Platelet, Hemoglobin A1c, Hepatic function panel, TSH, T4, free, Lipid panel, Sedimentation rate, ANA, CK, Vitamin B12, Ferritin  Right foot pain - Plan: Basic metabolic panel, CBC with Differential/Platelet, Hemoglobin A1c, Hepatic function panel, TSH, T4, free, Lipid panel, Sedimentation rate, ANA, CK, Vitamin B12,  Ferritin  BMI 38.0-38.9,adult - Plan: Basic metabolic panel, CBC with Differential/Platelet, Hemoglobin A1c, Hepatic function panel, TSH, T4, free, Lipid panel, Sedimentation rate, ANA, CK, Vitamin B12, Ferritin Multiple concerns today for acute visit none of them sound urgently vascular or neural urgent neurologic. Will get fasting labs consider follow-up as appropriate increasing exercise weight loss. Patient agrees   That may be deconditioning  Also  I  don't see symptoms consistent with blood clots or acute events. When discussing her symptoms they are more related to her legs with exercise then respiratory. She certainly should follow-up if progressive symptoms discussed follow-up after all labs done but if she feels well can choose to focus on life style    Expectant management. About alarm sx to return also  I think she should see sm etc about the foot however ir problematic  -Patient advised to return or notify health care team  if symptoms worsen ,persist or new concerns arise.  Patient Instructions  Plan fasting labs   Get [place] copy of   Gyne labs at the same time.   Doesn't sound like a vascular  Problem at this time.  right arm  Could be compressive positional  sx and can do referral  To neurology .  if postural  Changes dont help Healthy weight loss may help .   Thinks about weight watchers     .   Modest weight  Loss can help also .  Consider     Sports medicine  Or foot referral .   EKG today . Normal     Exercising to Stay Healthy Introduction Exercising regularly is important. It has many health benefits, such as:  Improving your overall fitness, flexibility, and endurance.  Increasing your bone density.  Helping with weight control.  Decreasing your body fat.  Increasing your muscle strength.  Reducing stress and tension.  Improving your overall health. In order to become healthy and stay healthy, it is recommended that you do moderate-intensity and  vigorous-intensity exercise. You can tell that you are exercising at a moderate intensity if you have a higher heart rate and faster breathing, but you are still able to hold a conversation. You can tell that you are exercising at a vigorous intensity if you are breathing much harder and faster and cannot hold a conversation while exercising. How often should I exercise? Choose an activity that you enjoy and set realistic goals. Your health care provider can help you to make an activity plan that works for you. Exercise regularly as directed by your health care provider. This may include:  Doing resistance training twice each week, such as:  Push-ups.  Sit-ups.  Lifting weights.  Using resistance bands.  Doing a given intensity of exercise for a given amount of time. Choose from these options:  150 minutes of moderate-intensity exercise every week.  75 minutes of vigorous-intensity exercise every week.  A mix of moderate-intensity and vigorous-intensity exercise every week. Children, pregnant women, people who are out of shape, people who are overweight, and older adults may need to consult a health care provider for individual recommendations. If you have any sort of medical condition, be sure to consult your health care provider before starting a new exercise program. What are some exercise ideas? Some moderate-intensity exercise ideas include:  Walking at a rate of 1 mile in 15 minutes.  Biking.  Hiking.  Golfing.  Dancing. Some vigorous-intensity exercise ideas include:  Walking at a rate of at least 4.5 miles per hour.  Jogging or running at a rate of 5 miles per hour.  Biking at a rate of at least 10 miles per hour.  Lap swimming.  Roller-skating or in-line skating.  Cross-country skiing.  Vigorous competitive sports, such as football, basketball, and soccer.  Jumping rope.  Aerobic dancing. What are some everyday activities that can help me to get  exercise?  Yard work, such  as:  Psychologist, educational.  Raking and bagging leaves.  Washing and waxing your car.  Pushing a stroller.  Shoveling snow.  Gardening.  Washing windows or floors. How can I be more active in my day-to-day activities?  Use the stairs instead of the elevator.  Take a walk during your lunch break.  If you drive, park your car farther away from work or school.  If you take public transportation, get off one stop early and walk the rest of the way.  Make all of your phone calls while standing up and walking around.  Get up, stretch, and walk around every 30 minutes throughout the day. What guidelines should I follow while exercising?  Do not exercise so much that you hurt yourself, feel dizzy, or get very short of breath.  Consult your health care provider before starting a new exercise program.  Wear comfortable clothes and shoes with good support.  Drink plenty of water while you exercise to prevent dehydration or heat stroke. Body water is lost during exercise and must be replaced.  Work out until you breathe faster and your heart beats faster. This information is not intended to replace advice given to you by your health care provider. Make sure you discuss any questions you have with your health care provider. Document Released: 07/03/2010 Document Revised: 11/06/2015 Document Reviewed: 11/01/2013  2017 Elsevier            Mariann Laster K. Avelynn Sellin M.D.

## 2016-07-13 NOTE — Progress Notes (Signed)
Pre visit review using our clinic review tool, if applicable. No additional management support is needed unless otherwise documented below in the visit note. 

## 2016-08-25 DIAGNOSIS — L239 Allergic contact dermatitis, unspecified cause: Secondary | ICD-10-CM | POA: Diagnosis not present

## 2016-08-25 DIAGNOSIS — L821 Other seborrheic keratosis: Secondary | ICD-10-CM | POA: Diagnosis not present

## 2016-08-25 DIAGNOSIS — L918 Other hypertrophic disorders of the skin: Secondary | ICD-10-CM | POA: Diagnosis not present

## 2016-08-30 ENCOUNTER — Telehealth: Payer: Self-pay | Admitting: Internal Medicine

## 2016-08-30 NOTE — Telephone Encounter (Signed)
° ° °  Pt call to ask if labs that Dr Regis Bill wanted from her gyn were received. Her gyn office said they faxed them over last week from Dr Garwin Brothers office

## 2016-09-02 NOTE — Telephone Encounter (Signed)
The lab results in md folder

## 2016-09-02 NOTE — Telephone Encounter (Signed)
Called dr cousins office and left message on voice mail for medical records to fax over

## 2016-09-03 ENCOUNTER — Encounter: Payer: Self-pay | Admitting: Family Medicine

## 2016-09-03 NOTE — Telephone Encounter (Signed)
Noted thank you

## 2016-09-20 ENCOUNTER — Encounter: Payer: Self-pay | Admitting: Internal Medicine

## 2016-09-20 NOTE — Telephone Encounter (Signed)
looks like we  can  cancel the lipid panel but     proceed with  the other labs in the system . Please inform patient of this

## 2016-09-29 ENCOUNTER — Encounter: Payer: Self-pay | Admitting: Family Medicine

## 2016-10-21 ENCOUNTER — Other Ambulatory Visit (INDEPENDENT_AMBULATORY_CARE_PROVIDER_SITE_OTHER): Payer: 59

## 2016-10-21 DIAGNOSIS — M79671 Pain in right foot: Secondary | ICD-10-CM | POA: Diagnosis not present

## 2016-10-21 DIAGNOSIS — M79605 Pain in left leg: Secondary | ICD-10-CM | POA: Diagnosis not present

## 2016-10-21 DIAGNOSIS — M79604 Pain in right leg: Secondary | ICD-10-CM | POA: Diagnosis not present

## 2016-10-21 DIAGNOSIS — R200 Anesthesia of skin: Secondary | ICD-10-CM | POA: Diagnosis not present

## 2016-10-21 DIAGNOSIS — R202 Paresthesia of skin: Secondary | ICD-10-CM

## 2016-10-21 DIAGNOSIS — R0602 Shortness of breath: Secondary | ICD-10-CM

## 2016-10-21 DIAGNOSIS — Z6838 Body mass index (BMI) 38.0-38.9, adult: Secondary | ICD-10-CM | POA: Diagnosis not present

## 2016-10-21 LAB — CBC WITH DIFFERENTIAL/PLATELET
BASOS ABS: 0 10*3/uL (ref 0.0–0.1)
Basophils Relative: 0.5 % (ref 0.0–3.0)
EOS ABS: 0.2 10*3/uL (ref 0.0–0.7)
Eosinophils Relative: 2.9 % (ref 0.0–5.0)
HEMATOCRIT: 37.1 % (ref 36.0–46.0)
Hemoglobin: 12 g/dL (ref 12.0–15.0)
LYMPHS PCT: 30.5 % (ref 12.0–46.0)
Lymphs Abs: 1.9 10*3/uL (ref 0.7–4.0)
MCHC: 32.3 g/dL (ref 30.0–36.0)
MCV: 78.5 fl (ref 78.0–100.0)
Monocytes Absolute: 0.6 10*3/uL (ref 0.1–1.0)
Monocytes Relative: 8.8 % (ref 3.0–12.0)
NEUTROS PCT: 57.3 % (ref 43.0–77.0)
Neutro Abs: 3.7 10*3/uL (ref 1.4–7.7)
Platelets: 227 10*3/uL (ref 150.0–400.0)
RBC: 4.72 Mil/uL (ref 3.87–5.11)
RDW: 16.9 % — ABNORMAL HIGH (ref 11.5–15.5)
WBC: 6.4 10*3/uL (ref 4.0–10.5)

## 2016-10-21 LAB — BASIC METABOLIC PANEL
BUN: 10 mg/dL (ref 6–23)
CALCIUM: 8.9 mg/dL (ref 8.4–10.5)
CO2: 28 mEq/L (ref 19–32)
Chloride: 105 mEq/L (ref 96–112)
Creatinine, Ser: 0.77 mg/dL (ref 0.40–1.20)
GFR: 85.68 mL/min (ref >60.00)
Glucose, Bld: 106 mg/dL — ABNORMAL HIGH (ref 70–99)
Potassium: 4.2 mEq/L (ref 3.5–5.1)
SODIUM: 140 meq/L (ref 135–145)

## 2016-10-21 LAB — LIPID PANEL
CHOL/HDL RATIO: 3
CHOLESTEROL: 150 mg/dL (ref 0–200)
HDL: 49.8 mg/dL (ref >39.00)
LDL Cholesterol: 86 mg/dL (ref 0–99)
NONHDL: 100.37
Triglycerides: 70 mg/dL (ref 0.0–149.0)
VLDL: 14 mg/dL (ref 0.0–40.0)

## 2016-10-21 LAB — TSH: TSH: 1.38 u[IU]/mL (ref 0.35–4.50)

## 2016-10-21 LAB — SEDIMENTATION RATE: Sed Rate: 5 mm/hr (ref 0–20)

## 2016-10-21 LAB — FERRITIN: FERRITIN: 5.3 ng/mL — AB (ref 10.0–291.0)

## 2016-10-21 LAB — HEPATIC FUNCTION PANEL
ALK PHOS: 70 U/L (ref 39–117)
ALT: 15 U/L (ref 0–35)
AST: 13 U/L (ref 0–37)
Albumin: 4.1 g/dL (ref 3.5–5.2)
BILIRUBIN DIRECT: 0.1 mg/dL (ref 0.0–0.3)
BILIRUBIN TOTAL: 0.3 mg/dL (ref 0.2–1.2)
TOTAL PROTEIN: 6.4 g/dL (ref 6.0–8.3)

## 2016-10-21 LAB — CK: Total CK: 46 U/L (ref 7–177)

## 2016-10-21 LAB — HEMOGLOBIN A1C: Hgb A1c MFr Bld: 6.1 % (ref 4.6–6.5)

## 2016-10-21 LAB — VITAMIN B12: VITAMIN B 12: 265 pg/mL (ref 211–911)

## 2016-10-21 LAB — T4, FREE: Free T4: 0.75 ng/dL (ref 0.60–1.60)

## 2016-10-22 LAB — ANA: ANA: NEGATIVE

## 2016-11-01 ENCOUNTER — Telehealth: Payer: Self-pay | Admitting: Internal Medicine

## 2016-11-01 NOTE — Telephone Encounter (Signed)
Spoke with patient.  Nothing further needed

## 2016-11-01 NOTE — Telephone Encounter (Signed)
Pt is returning torrie call °

## 2017-01-13 DIAGNOSIS — Z01419 Encounter for gynecological examination (general) (routine) without abnormal findings: Secondary | ICD-10-CM | POA: Diagnosis not present

## 2017-03-04 ENCOUNTER — Encounter: Payer: Self-pay | Admitting: Internal Medicine

## 2017-03-04 DIAGNOSIS — H66001 Acute suppurative otitis media without spontaneous rupture of ear drum, right ear: Secondary | ICD-10-CM | POA: Diagnosis not present

## 2017-03-04 DIAGNOSIS — H60391 Other infective otitis externa, right ear: Secondary | ICD-10-CM | POA: Diagnosis not present

## 2017-03-06 ENCOUNTER — Encounter (HOSPITAL_COMMUNITY): Payer: Self-pay | Admitting: *Deleted

## 2017-03-06 ENCOUNTER — Ambulatory Visit (HOSPITAL_COMMUNITY)
Admission: EM | Admit: 2017-03-06 | Discharge: 2017-03-06 | Disposition: A | Discharge status: Home / Self Care | Payer: 59 | Attending: Family Medicine | Admitting: Family Medicine

## 2017-03-06 DIAGNOSIS — H6021 Malignant otitis externa, right ear: Secondary | ICD-10-CM

## 2017-03-06 MED ORDER — HYDROCODONE-ACETAMINOPHEN 5-325 MG PO TABS: 1.0000 | ORAL_TABLET | Freq: Four times a day (QID) | ORAL | 0 refills | Status: DC | PRN

## 2017-03-06 MED ORDER — CIPROFLOXACIN HCL 500 MG PO TABS: 500.0000 mg | ORAL_TABLET | Freq: Two times a day (BID) | ORAL | 0 refills | Status: DC

## 2017-03-06 NOTE — ED Provider Notes (Signed)
[place]    CSN: [id number] Arrival date & time: 03/06/17  1752     History   Chief Complaint Chief Complaint  Patient presents with  . Otalgia    HPI Sharon Myers is a 46 y.o. female.   Was originally prescribed amoxicillin and Ciprodex for outer ear infection while on vacation; after one day abx changed to Augmentin.  Has been on abx x 2-3 days, but states there has been no improvement.      Past Medical History:  Diagnosis Date  . Allergy   . AMA (advanced maternal age) multigravida 36+   . Depression   . Family history of blood clots 05/22/2013  . Goiter, unspecified   . History of chicken pox   . Hx of varicella   . Other ankle sprain and strain    achilles rupture  . Ruptured lumbar disc   . 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 Foot pain, right 12/07/2013  . Disturbed concentration 08/29/2013  . Snoring 08/29/2013  . Family history of sleep apnea 08/29/2013  . Family history of blood clots 05/22/2013  . Cutaneous skin tags 12/26/2012  . Numbness and tingling of leg 11/22/2012  . Goiter 11/22/2012  . Other malaise and fatigue 11/22/2012  . Pain in both feet 11/22/2012  . Adjustment disorder with depressed mood 11/22/2012    Past Surgical History:  Procedure Laterality Date  . ACHILLES TENDON REPAIR     Left softball injury Dr. [name]  . BACK SURGERY     hirsch  . SKIN GRAFT      OB History    Gravida Para Term Preterm AB Living   5 2 2  0 2 2   SAB TAB Ectopic Multiple Live Births   1 1 0 0 2       Home Medications    Prior to Admission medications   Medication Sig Start Date End Date Taking? Authorizing Provider  ciprofloxacin-dexamethasone (CIPRODEX) OTIC suspension 4 drops 2 (two) times daily.   Yes [provider]  IBUPROFEN PO Take by mouth.   Yes [provider]  ciprofloxacin (CIPRO) 500 MG tablet Take 1 tablet (500 mg total) by  mouth 2 (two) times daily. 03/06/17   Robyn Haber, MD  HYDROcodone-acetaminophen (NORCO) 5-325 MG tablet Take 1 tablet by mouth every 6 (six) hours as needed for moderate pain. 03/06/17   Robyn Haber, MD  sertraline (ZOLOFT) 50 MG tablet Take 1 tablet (50 mg total) by mouth daily. 01/03/15   Panosh, Standley Brooking, MD    Family History Family History  Problem Relation Age of Onset  . Cancer Mother        rare blood cancer  . Stroke Mother   . Heart failure Mother   . Hypertension Father   . Nephrolithiasis Father   . Diabetes Father   . COPD Father   . Cancer Father        kidney  . Thyroid disease Maternal Aunt   . Arthritis Maternal Grandmother        rheumatoid  . Heart disease Maternal Grandfather   . Cancer Maternal Grandfather        stomach  . Cancer Paternal Grandfather        stomach ca    Social History Social History  Substance Use Topics  . Smoking status: Never Smoker  . Smokeless tobacco: Never Used  . Alcohol  use Yes     Comment: occasionally     Allergies   Patient has no known allergies.   Review of Systems Review of Systems  HENT: Positive for ear pain.   All other systems reviewed and are negative.    Physical Exam Triage Vital Signs ED Triage Vitals [03/06/17 1856]  Enc Vitals Group     BP (!) 155/98     Pulse Rate 78     Resp 16     Temp 98.9 F (37.2 C)     Temp Source Oral     SpO2 100 %     Weight      Height      Head Circumference      Peak Flow      Pain Score 8     Pain Loc      Pain Edu?      Excl. in [place]?    No data found.   Updated Vital Signs BP (!) 155/98   Pulse 78   Temp 98.9 F (37.2 C) (Oral)   Resp 16   LMP 03/01/2017 (Exact Date)   SpO2 100%   Physical Exam  Constitutional: She is oriented to person, place, and time. She appears well-developed and well-nourished.  HENT:  Left Ear: External ear normal.  Mouth/Throat: Oropharynx is clear and moist.  Right ear canal is swollen, red, and tender    Eyes: Conjunctivae are normal.  Neck: Normal range of motion. Neck supple.  Pulmonary/Chest: Effort normal.  Musculoskeletal: Normal range of motion.  Neurological: She is alert and oriented to person, place, and time.  Skin: Skin is warm and dry.  Nursing note and vitals reviewed.    UC Treatments / Results  Labs (all labs ordered are listed, but only abnormal results are displayed) Labs Reviewed - No data to display  EKG  EKG Interpretation None       Radiology No results found.  Procedures Procedures (including critical care time)  Medications Ordered in UC Medications - No data to display   Initial Impression / Assessment and Plan / UC Course  I have reviewed the triage vital signs and the nursing notes.  Pertinent labs & imaging results that were available during my care of the patient were reviewed by me and considered in my medical decision making (see chart for details).     Final Clinical Impressions(s) / UC Diagnoses   Final diagnoses:  Acute malignant otitis externa of right ear    New Prescriptions New Prescriptions   CIPROFLOXACIN (CIPRO) 500 MG TABLET    Take 1 tablet (500 mg total) by mouth 2 (two) times daily.   HYDROCODONE-ACETAMINOPHEN (NORCO) 5-325 MG TABLET    Take 1 tablet by mouth every 6 (six) hours as needed for moderate pain.     Controlled Substance Prescriptions [place] Controlled Substance Registry consulted? Not Applicable   Robyn Haber, MD 03/06/17 [phone]

## 2017-03-06 NOTE — ED Triage Notes (Signed)
Was originally prescribed amoxicillin and Ciprodex for outer ear infection while on vacation; after one day abx changed to Augmentin.  Has been on abx x 2-3 days, but states there has been no improvement.

## 2017-04-09 DIAGNOSIS — Z23 Encounter for immunization: Secondary | ICD-10-CM | POA: Diagnosis not present

## 2017-04-14 DIAGNOSIS — L570 Actinic keratosis: Secondary | ICD-10-CM | POA: Diagnosis not present

## 2017-04-14 DIAGNOSIS — L821 Other seborrheic keratosis: Secondary | ICD-10-CM | POA: Diagnosis not present

## 2017-05-20 DIAGNOSIS — H35411 Lattice degeneration of retina, right eye: Secondary | ICD-10-CM | POA: Diagnosis not present

## 2017-07-29 ENCOUNTER — Encounter: Payer: Self-pay | Admitting: Adult Health

## 2017-07-29 ENCOUNTER — Ambulatory Visit: Payer: 59 | Admitting: Adult Health

## 2017-07-29 VITALS — BP 140/94 | Temp 98.5°F | Wt 260.0 lb

## 2017-07-29 DIAGNOSIS — M79661 Pain in right lower leg: Secondary | ICD-10-CM

## 2017-07-29 NOTE — Progress Notes (Signed)
Subjective:    Patient ID: Sharon Myers, female    DOB: 04/07/1971, 47 y.o.   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  She presents to the clinic today for an acute issue of "popping" sensation in her right calf.  She reports a "knot" calf for the last 6 months.  She was walking the dog this afternoon when she felt a popping sensation.  Reports that the sensation and pain that is described as achy is similar to the sensation she had when she ruptured her Achilles tendon in her left leg.  She does endorse some mild pain while walking as well as feeling as though her leg is going to give out.   Review of Systems See HPI   Past Medical History:  Diagnosis Date  . Allergy   . AMA (advanced maternal age) multigravida 70+   . Depression   . Family history of blood clots 05/22/2013  . Goiter, unspecified   . History of chicken pox   . Hx of varicella   . Other ankle sprain and strain    achilles rupture  . Ruptured lumbar disc   . 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 Marital status: Married    Spouse name: Not on file  . Number of children: Not on file  . Years of education: Not on file  . Highest education level: Not on file  Social Needs  . Financial resource strain: Not on file  . Food insecurity - worry: Not on file  . Food insecurity - inability: Not on file  . Transportation needs - medical: Not on file  . Transportation needs - non-medical: Not on file  Occupational History  . Not on file  Tobacco Use  . Smoking status: Never Smoker  . Smokeless tobacco: Never Used  Substance and Sexual Activity  . Alcohol use:  Yes    Comment: occasionally  . Drug use: No  . Sexual activity: Yes    Comment: tubal if CS  Other Topics Concern  . Not on file  Social History Narrative   Usually receives 6 hours of sleep at night   4 people living in the home. No pets   Married 2 young children   Bachelors degree [employer] 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 ACHILLES TENDON REPAIR     Left softball injury Dr. [name]  . BACK SURGERY     hirsch  . SKIN GRAFT      Family History  Problem Relation Age of Onset  . Cancer Mother        rare blood cancer  . Stroke Mother   . Heart failure Mother   . Hypertension Father   . Nephrolithiasis Father   . Diabetes Father   . COPD Father   . Cancer Father        kidney  . Thyroid disease Maternal Aunt   . Arthritis Maternal  Grandmother        rheumatoid  . Heart disease Maternal Grandfather   . Cancer Maternal Grandfather        stomach  . Cancer Paternal Grandfather        stomach ca    No Known Allergies  Current Outpatient Medications on File Prior to Visit  Medication Sig Dispense Refill  . sertraline (ZOLOFT) 50 MG tablet Take 1 tablet (50 mg total) by mouth daily. 90 tablet 3  . IBUPROFEN PO Take by mouth.     No current facility-administered medications on file prior to visit.     BP (!) 140/94   Temp 98.5 F (36.9 C) (Oral)   Wt 260 lb (117.9 kg)   BMI 38.96 kg/m       Objective:   Physical Exam  Constitutional: She is oriented to person, place, and time. She appears well-developed and well-nourished. No distress.  Cardiovascular: Normal rate, regular rhythm, normal heart sounds and intact distal pulses. Exam reveals no gallop and no friction rub.  No murmur heard. Pulmonary/Chest: Effort normal and breath sounds normal. No respiratory distress. She has no wheezes. She has  no rales. She exhibits no tenderness.  Musculoskeletal: She exhibits tenderness.  Tenderness with deep palpation to medial aspect of right calf.  Negative Thompson's test he did have plantarflexion with on calf compression.  No bruising noted.  Due to body habitus hard to tell if any distinct swelling is present. No mass felt.  - Normal distal pulses bilateral   Neurological: She is alert and oriented to person, place, and time.  Skin: Skin is warm and dry. No rash noted. She is not diaphoretic. No erythema. No pallor.  Psychiatric: She has a normal mood and affect. Her behavior is normal. Judgment and thought content normal.  Nursing note and vitals reviewed.     Assessment & Plan:  1. Right calf pain - Probable grade three muscle strain. Patient would like to have MRI done to r/o achillies tendon rupture.  - Advised rest and tylenol for pain - limit weight bearing  - MR ANKLE RIGHT WO CONTRAST; Future  Dorothyann Peng, NP

## 2017-07-29 NOTE — Progress Notes (Signed)
Subjective:    Patient ID: Sharon Myers, female    DOB: 1970-08-12, 47 y.o.   MRN: [id number]  Patient presents with right calf pain.  She complains of a "knot" in her right calf that she has had for 6 months without pain, redness or warmth.  She has had no recent travel with immobility for extended periods of time.  She did take a round of ciprofloxacin in September/October but was experiencing this "knot" prior to that.  Today while walking her dog, she had to walk vigorously to keep up and felt a "pop" in the same spot as the knot.  Since that time the "knot" has subsided but she is experiencing some pain with plantar flexion and feels like her leg will give out when she walks down the stairs.    Review of Systems  Constitutional: Negative.   Respiratory: Negative.   Cardiovascular: Negative.   Musculoskeletal: Negative.        Right calf pain  Hematological: Does not bruise/bleed easily.   Past Medical History:  Diagnosis Date  . Allergy   . AMA (advanced maternal age) multigravida 15+   . Depression   . Family history of blood clots 05/22/2013  . Goiter, unspecified   . History of chicken pox   . Hx of varicella   . Other ankle sprain and strain    achilles rupture  . Ruptured lumbar disc   . 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 Marital status: Married    Spouse name: Not on file  . Number of children: Not on file  . Years of education: Not on file  . Highest education level: Not on file  Social Needs  . Financial resource strain: Not on file  . Food insecurity - worry: Not on file  . Food insecurity - inability: Not on file  . Transportation needs - medical: Not on file  . Transportation needs - non-medical: Not on file  Occupational History  . Not on file  Tobacco Use  . Smoking status: Never Smoker  . Smokeless tobacco: Never Used  Substance and Sexual Activity  . Alcohol use: Yes    Comment:  occasionally  . Drug use: No  . Sexual activity: Yes    Comment: tubal if CS  Other Topics Concern  . Not on file  Social History Narrative   Usually receives 6 hours of sleep at night   4 people living in the home. No pets   Married 2 young children   Bachelors degree [employer] 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 ACHILLES TENDON REPAIR     Left softball injury Dr. [name]  . BACK SURGERY     hirsch  . SKIN GRAFT      Family History  Problem Relation Age of Onset  . Cancer Mother        rare blood cancer  . Stroke Mother   . Heart failure Mother   . Hypertension Father   . Nephrolithiasis Father   . Diabetes Father   . COPD Father   . Cancer Father        kidney  . Thyroid disease Maternal Aunt   . Arthritis  Maternal Grandmother        rheumatoid  . Heart disease Maternal Grandfather   . Cancer Maternal Grandfather        stomach  . Cancer Paternal Grandfather        stomach ca    No Known Allergies  Current Outpatient Medications on File Prior to Visit  Medication Sig Dispense Refill  . sertraline (ZOLOFT) 50 MG tablet Take 1 tablet (50 mg total) by mouth daily. 90 tablet 3  . IBUPROFEN PO Take by mouth.     No current facility-administered medications on file prior to visit.     BP (!) 140/94   Temp 98.5 F (36.9 C) (Oral)   Wt 260 lb (117.9 kg)   BMI 38.96 kg/m      Objective:   Physical Exam  Constitutional: She appears well-developed and well-nourished. No distress.  Cardiovascular:  Pulses:      Dorsalis pedis pulses are 2+ on the right side, and 2+ on the left side.       Posterior tibial pulses are 2+ on the right side, and 2+ on the left side.  Pulmonary/Chest: Effort normal. No respiratory distress.  Musculoskeletal:       Right lower leg: She exhibits tenderness. She exhibits  no swelling, no edema and no deformity.  (-) Thompson's sign Tenderness in the right calf with dorsiflexion. No pain or tenderness with achilles palpation. No obvious deformity of the right calf or ankle.   Skin:  No redness, warmth or streaking of the right calf.   Nursing note and vitals reviewed.     Assessment & Plan:  1. Right calf pain Rest your right leg and calf this weekend.  Take tylenol for pain if needed this week.   - MR ANKLE RIGHT WO CONTRAST; Future  Bobetta Korf C Anica Alcaraz BSN RN NP student

## 2017-08-05 ENCOUNTER — Encounter: Payer: Self-pay | Admitting: Adult Health

## 2017-08-09 ENCOUNTER — Other Ambulatory Visit: Payer: Self-pay | Admitting: Adult Health

## 2017-08-09 ENCOUNTER — Ambulatory Visit (INDEPENDENT_AMBULATORY_CARE_PROVIDER_SITE_OTHER)
Admission: RE | Admit: 2017-08-09 | Discharge: 2017-08-09 | Disposition: A | Discharge status: Home / Self Care | Payer: 59 | Source: Ambulatory Visit | Attending: Adult Health | Admitting: Adult Health

## 2017-08-09 ENCOUNTER — Other Ambulatory Visit: Payer: Self-pay | Admitting: Family Medicine

## 2017-08-09 ENCOUNTER — Telehealth: Payer: Self-pay | Admitting: Adult Health

## 2017-08-09 DIAGNOSIS — S86001A Unspecified injury of right Achilles tendon, initial encounter: Secondary | ICD-10-CM

## 2017-08-09 DIAGNOSIS — M79661 Pain in right lower leg: Secondary | ICD-10-CM

## 2017-08-09 NOTE — Telephone Encounter (Signed)
Copied from [place] [phone] [phone]. Topic: General - Other >> Aug 09, 2017 10:50 AM Cecelia Byars, NT wrote: Reason for CRM: Patient would like a return call from a nurse in regards to a denied healthcare claim she had another episode today with her right calf  which supports the need for the mri please call  her at [phone] to discuss as soon as possible .

## 2017-08-09 NOTE — Telephone Encounter (Signed)
Spoke to the pt.  Advised that Sharon Myers has ordered an x-ray and a referral to ortho.  Pt states she will go for x-ray today.  Will call with results as soon as I have them.

## 2017-08-09 NOTE — Telephone Encounter (Signed)
Spoke to the pt.  Advised that Tommi Rumps has ordered an x-ray and a referral to ortho.  Pt states she will go for x-ray today.  Will call with results as soon as I have them.

## 2017-08-13 ENCOUNTER — Other Ambulatory Visit: Payer: Self-pay

## 2017-08-16 DIAGNOSIS — L820 Inflamed seborrheic keratosis: Secondary | ICD-10-CM | POA: Diagnosis not present

## 2017-08-16 DIAGNOSIS — L821 Other seborrheic keratosis: Secondary | ICD-10-CM | POA: Diagnosis not present

## 2017-08-16 DIAGNOSIS — D1801 Hemangioma of skin and subcutaneous tissue: Secondary | ICD-10-CM | POA: Diagnosis not present

## 2017-08-18 ENCOUNTER — Ambulatory Visit (INDEPENDENT_AMBULATORY_CARE_PROVIDER_SITE_OTHER): Payer: 59 | Admitting: Orthopedic Surgery

## 2017-08-18 ENCOUNTER — Encounter (INDEPENDENT_AMBULATORY_CARE_PROVIDER_SITE_OTHER): Payer: Self-pay | Admitting: Orthopedic Surgery

## 2017-08-18 DIAGNOSIS — M79661 Pain in right lower leg: Secondary | ICD-10-CM

## 2017-08-19 ENCOUNTER — Telehealth (INDEPENDENT_AMBULATORY_CARE_PROVIDER_SITE_OTHER): Payer: Self-pay

## 2017-08-19 ENCOUNTER — Encounter (INDEPENDENT_AMBULATORY_CARE_PROVIDER_SITE_OTHER): Payer: Self-pay | Admitting: Orthopedic Surgery

## 2017-08-19 ENCOUNTER — Ambulatory Visit (HOSPITAL_COMMUNITY)
Admission: RE | Admit: 2017-08-19 | Discharge: 2017-08-19 | Disposition: A | Discharge status: Home / Self Care | Payer: 59 | Source: Ambulatory Visit | Attending: Orthopedic Surgery | Admitting: Orthopedic Surgery

## 2017-08-19 DIAGNOSIS — M79661 Pain in right lower leg: Secondary | ICD-10-CM | POA: Insufficient documentation

## 2017-08-19 NOTE — Telephone Encounter (Signed)
Give this 1 month of compression and stretching and then if it is no better we will see her back in 4 weeks for repeat clinical assessment and likely MRI scanning

## 2017-08-19 NOTE — Telephone Encounter (Signed)
[facility] called to let you know that patients ultrasound was negative for DVT but they did note an area of fluid in mid calf that could possibly be a muscle tear but ultrasound could not fully evaluate this. Please advise next step for patient. Thanks.

## 2017-08-19 NOTE — Progress Notes (Signed)
RLE venous duplex prelim: negative for DVT. Hypoechoic area right mid calf, possibly suggesting muscle tear. Landry Mellow, RDMS, RVT Called results to nurse.

## 2017-08-19 NOTE — Progress Notes (Signed)
Office Visit Note   Patient: Sharon Myers           Date of Birth: 06-16-1970           MRN: [id number] Visit Date: 08/18/2017 Requested by: Dorothyann Peng, NP [place], [place] [address] PCP: Burnis Medin, MD  Subjective: Chief Complaint  Patient presents with  . Right Leg - Pain    HPI: Sharon Myers is a 47 year old patient with right calf pain.  Been going on for a month.  She does not have any specific injury that she recalls.  She states several weeks ago she felt a sharp pain in the calf when she was walking.  Improved some and then she has felt it pop again on 2 other occasions.  She has had left Achilles tendon rupture which was repaired.  She has been taking ibuprofen as needed.  She does work at a desk job.  Does have a family history positive for DVT from her father.  She reports swelling in the calf but does not report any bruising at the time of her "injury" a month ago.              ROS: All systems reviewed are negative as they relate to the chief complaint within the history of present illness.  Patient denies  fevers or chills. Impression is  Assessment & Plan: Visit Diagnoses:  1. Right calf pain     Plan: Right calf pain with possible medial gastroc tear partial versus complete versus DVT.  Plan is ultrasound to rule out DVT which is negative at the time of this dictation.  We will going to try conservative measures such as wrapping and ice and stretching.  Come back in 4 weeks and we can decide for or against further imaging at that time depending on whether or not she is progressing with her rehab.  With negative DVT I think partial versus complete muscle tendon tear at that medial head of the gastroc is the likely culprit.  This should improve by the time we see her back in 4 weeks  Follow-Up Instructions: Return in about 4 weeks (around 09/15/2017).   Orders:  No orders of the defined types were placed in this encounter.  No orders of the  defined types were placed in this encounter.     Procedures: No procedures performed   Clinical Data: No additional findings.  Objective: Vital Signs: LMP 07/19/2017   Physical Exam:   Constitutional: Patient appears well-developed HEENT:  Head: Normocephalic Eyes:EOM are normal Neck: Normal range of motion Cardiovascular: Normal rate Pulmonary/chest: Effort normal Neurologic: Patient is alert Skin: Skin is warm Psychiatric: Patient has normal mood and affect    Ortho Exam: Orthopedic exam demonstrates slightly antalgic gait to the right.  Does have hypertrophy of the calf on the right compared to the left but she has had previous left Achilles surgery.  This is not really and apples to apples comparison.  Pedal pulses palpable on the right.  Does have tenderness to palpation along the medial head and lateral head of the gastroc.  Her Achilles is palpable and and intact.  She has no paresthesias on the dorsal plantar aspect of the foot.  Tibiotalar subtalar transverse tarsal range of motion is intact.  Specialty Comments:  No specialty comments available.  Imaging: No results found.   PMFS History: Patient Active Problem List   Diagnosis Date Noted  . Foot pain, right 12/07/2013  . Disturbed concentration  08/29/2013  . Snoring 08/29/2013  . Family history of sleep apnea 08/29/2013  . Family history of blood clots 05/22/2013  . Cutaneous skin tags 12/26/2012  . Numbness and tingling of leg 11/22/2012  . Goiter 11/22/2012  . Other malaise and fatigue 11/22/2012  . Pain in both feet 11/22/2012  . Adjustment disorder with depressed mood 11/22/2012   Past Medical History:  Diagnosis Date  . Allergy   . AMA (advanced maternal age) multigravida 63+   . Depression   . Family history of blood clots 05/22/2013  . Goiter, unspecified   . History of chicken pox   . Hx of varicella   . Other ankle sprain and strain    achilles rupture  . Ruptured lumbar disc   .  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 Cancer Mother        rare blood cancer  . Stroke Mother   . Heart failure Mother   . Hypertension Father   . Nephrolithiasis Father   . Diabetes Father   . COPD Father   . Cancer Father        kidney  . Thyroid disease Maternal Aunt   . Arthritis Maternal Grandmother        rheumatoid  . Heart disease Maternal Grandfather   . Cancer Maternal Grandfather        stomach  . Cancer Paternal Grandfather        stomach ca    Past Surgical History:  Procedure Laterality Date  . ACHILLES TENDON REPAIR     Left softball injury Dr. [name]  . BACK SURGERY     hirsch  . SKIN GRAFT     Social History   Occupational History  . Not on file  Tobacco Use  . Smoking status: Never Smoker  . Smokeless tobacco: Never Used  Substance and Sexual Activity  . Alcohol use: Yes    Comment: occasionally  . Drug use: No  . Sexual activity: Yes    Comment: tubal if CS

## 2017-08-19 NOTE — Telephone Encounter (Signed)
IC no answer. LMVM advising per Dr Dean.  

## 2017-12-28 NOTE — Progress Notes (Signed)
Chief Complaint  Patient presents with  . Knee Pain    both    HPI:  Sharon Dewing 47 y.o. come in [place] [facility]   knees up and down stairs and feel like going to give out. Slow ascent   Cracking and crunching front  knee pain?    Ongoing for a year and then 2 mos ago   Onset left  Ankle .  pain  Hx of ruptured achilles. 10 years ago but no recent injury   Limping.   At times   Medial pain .    Can she get a sleep study  Cause of napping in middle of day.    Sleepiness  And snores a lot   ? If undiagnosed in family   Fall aselp nap in day and  Sometimes  doesn't wake up  For work . 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 Snores at hom e. ? I ffamily hx of  Sleep apnea.   Past Medical History:  Diagnosis Date  . Allergy   . AMA (advanced maternal age) multigravida 22+   . Depression   . Family history of blood clots 05/22/2013  . Goiter, unspecified   . History of chicken pox   . Hx of varicella   . Other ankle sprain and strain    achilles rupture  . Ruptured lumbar disc   . 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 Cancer Mother        rare blood cancer  . Stroke Mother   . Heart failure Mother   . Hypertension Father   . Nephrolithiasis Father   . Diabetes Father   . COPD Father   . Cancer Father        kidney  . Thyroid disease Maternal Aunt   . Arthritis Maternal Grandmother        rheumatoid  . Heart disease Maternal Grandfather   . Cancer Maternal Grandfather        stomach  . Cancer Paternal Grandfather        stomach ca    Social History   Socioeconomic History  . Marital status: Married    Spouse  name: Not on file  . Number of children: Not on file  . Years of education: Not on file  . Highest education level: Not on file  Occupational History  . Not on file  Social Needs  . Financial resource strain: Not on file  . Food insecurity:    Worry: Not on file    Inability: Not on file  . Transportation needs:    Medical: Not on file    Non-medical: Not on file  Tobacco Use  . Smoking status: Never Smoker  . Smokeless tobacco: Never Used  Substance and Sexual Activity  . Alcohol use: Yes    Comment: occasionally  . Drug use: No  . Sexual activity: Yes    Comment: tubal if CS  Lifestyle  . Physical activity:    Days per  week: Not on file    Minutes per session: Not on file  . Stress: Not on file  Relationships  . Social connections:    Talks on phone: Not on file    Gets together: Not on file    Attends religious service: Not on file    Active member of club or organization: Not on file    Attends meetings of clubs or organizations: Not on file    Relationship status: Not on file  Other Topics Concern  . Not on file  Social History Narrative   Usually receives 6 hours of sleep at night   4 people living in the home. No pets   Married 2 young children   Bachelors degree [employer] 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 IBUPROFEN PO Take by mouth.    . sertraline (ZOLOFT) 50 MG tablet Take 1 tablet (50 mg total) by mouth daily. 90 tablet 3   No facility-administered medications prior to visit.      EXAM:  BP 140/90   Pulse 83   Temp 98.6 F (37 C)   Wt 259 lb (117.5 kg)   BMI 38.81 kg/m   Body mass index is 38.81 kg/m.  GENERAL: vitals reviewed and listed above, alert, oriented, appears well hydrated and in no acute distress ocass yawning  HEENT: atraumatic, conjunctiva   clear, no obvious abnormalities on inspection of external nose and ears    MS: moves all extremities without noticeable focal  Abnormality  X thickening left achilles   Tender medial ankle no point bony tenderness   knees no swelling  Pos crepitus  = drawers no medial JLtenderness    nv seems normal  PSYCH: pleasant and cooperative, no obvious depression or anxiety Lab Results  Component Value Date   WBC 6.4 10/21/2016   HGB 12.0 10/21/2016   HCT 37.1 10/21/2016   PLT 227.0 10/21/2016   GLUCOSE 106 (H) 10/21/2016   CHOL 150 10/21/2016   TRIG 70.0 10/21/2016   HDL 49.80 10/21/2016   LDLCALC 86 10/21/2016   ALT 15 10/21/2016   AST 13 10/21/2016   NA 140 10/21/2016   K 4.2 10/21/2016   CL 105 10/21/2016   CREATININE 0.77 10/21/2016   BUN 10 10/21/2016   CO2 28 10/21/2016   TSH 1.38 10/21/2016   HGBA1C 6.1 10/21/2016   BP Readings from Last 3 Encounters:  12/29/17 140/90  07/29/17 (!) 140/94  03/06/17 (!) 155/98    ASSESSMENT AND PLAN:  Discussed the following assessment and plan:  Chronic pain of both knees - anterior knee pain poss tracking issue may benefrt from exercise program in addition to weight loss see sports med   Snoring - refer ro pulm for  osa eval  Sleepiness  Left ankle pain, unspecified chronicity - prev  achilles surgery remote  now problematic  problem  no ob instabiity  see sprts medicine ''revewied  Above  And  Tacking to begin healthy weight loss and  Sleep   -Patient advised to return or notify health care team  if  new concerns arise.  Patient Instructions  Will do a referral  To pulmonary  For sleep evaluation.    And sports  medicine  For  Left foot ankle issue.and knees.   Knee may be   Patellar  Tracking problem  And  Can respond to   Exercise program for the knees and I addition to  Lowing weight.     Knee Exercises Ask your health care provider which exercises are safe for you. Do exercises exactly as told by your health care  provider and adjust them as directed. It is normal to feel mild stretching, pulling, tightness, or discomfort as you do these exercises, but you should stop right away if you feel sudden pain or your pain gets worse.Do not begin these exercises until told by your health care provider. STRETCHING AND RANGE OF MOTION EXERCISES These exercises warm up your muscles and joints and improve the movement and flexibility of your knee. These exercises also help to relieve pain, numbness, and tingling. Exercise A: Knee Extension, Prone 1. Lie on your abdomen on a bed. 2. Place your left / right knee just beyond the edge of the surface so your knee is not on the bed. You can put a towel under your left / right thigh just above your knee for comfort. 3. Relax your leg muscles and allow gravity to straighten your knee. You should feel a stretch behind your left / right knee. 4. Hold this position for __________ seconds. 5. Scoot up so your knee is supported between repetitions. Repeat __________ times. Complete this stretch __________ times a day. Exercise B: Knee Flexion, Active  1. Lie on your back with both knees straight. If this causes back discomfort, bend your left / right knee so your foot is flat on the floor. 2. Slowly slide your left / right heel back toward your buttocks until you feel a gentle stretch in the front of your knee or thigh. 3. Hold this position for __________ seconds. 4. Slowly slide your left / right heel back to the starting position. Repeat __________ times. Complete this exercise __________ times a day. Exercise C: Quadriceps, Prone  1. Lie on your abdomen on a firm surface, such as a bed or padded floor. 2. Bend your left / right knee and hold your ankle. If you cannot reach your ankle or pant leg, loop a belt around your foot and grab the belt instead. 3. Gently pull your heel toward your buttocks. Your knee should not slide out to the side. You should feel a stretch in the  front of your thigh and knee. 4. Hold this position for __________ seconds. Repeat __________ times. Complete this stretch __________ times a day. Exercise D: Hamstring, Supine 1. Lie on your back. 2. Loop a belt or towel over the ball of your left / right foot. The ball of your foot is on the walking surface, right under your toes. 3. Straighten your left / right knee and slowly pull on the belt to raise your leg until you feel a gentle stretch behind your knee. ? Do not let your left / right knee bend while you do this. ? Keep your other leg flat on the floor. 4. Hold this position for __________ seconds. Repeat __________ times. Complete this stretch __________ times a day. STRENGTHENING EXERCISES These exercises build strength and endurance in your knee. Endurance is the ability to use your muscles for a long time, even after they get tired. Exercise E: Quadriceps, Isometric  1. Lie on your back with your left / right leg extended and your other knee bent. Put a rolled towel or small pillow under your  knee if told by your health care provider. 2. Slowly tense the muscles in the front of your left / right thigh. You should see your kneecap slide up toward your hip or see increased dimpling just above the knee. This motion will push the back of the knee toward the floor. 3. For __________ seconds, keep the muscle as tight as you can without increasing your pain. 4. Relax the muscles slowly and completely. Repeat __________ times. Complete this exercise __________ times a day. Exercise F: Straight Leg Raises - Quadriceps 1. Lie on your back with your left / right leg extended and your other knee bent. 2. Tense the muscles in the front of your left / right thigh. You should see your kneecap slide up or see increased dimpling just above the knee. Your thigh may even shake a bit. 3. Keep these muscles tight as you raise your leg 4-6 inches (10-15 cm) off the floor. Do not let your knee  bend. 4. Hold this position for __________ seconds. 5. Keep these muscles tense as you lower your leg. 6. Relax your muscles slowly and completely after each repetition. Repeat __________ times. Complete this exercise __________ times a day. Exercise G: Hamstring, Isometric 1. Lie on your back on a firm surface. 2. Bend your left / right knee approximately __________ degrees. 3. Dig your left / right heel into the surface as if you are trying to pull it toward your buttocks. Tighten the muscles in the back of your thighs to dig as hard as you can without increasing any pain. 4. Hold this position for __________ seconds. 5. Release the tension gradually and allow your muscles to relax completely for __________ seconds after each repetition. Repeat __________ times. Complete this exercise __________ times a day. Exercise H: Hamstring Curls  If told by your health care provider, do this exercise while wearing ankle weights. Begin with __________ weights. Then increase the weight by 1 lb (0.5 kg) increments. Do not wear ankle weights that are more than __________. 1. Lie on your abdomen with your legs straight. 2. Bend your left / right knee as far as you can without feeling pain. Keep your hips flat against the floor. 3. Hold this position for __________ seconds. 4. Slowly lower your leg to the starting position.  Repeat __________ times. Complete this exercise __________ times a day. Exercise I: Squats (Quadriceps) 1. Stand in front of a table, with your feet and knees pointing straight ahead. You may rest your hands on the table for balance but not for support. 2. Slowly bend your knees and lower your hips like you are going to sit in a chair. ? Keep your weight over your heels, not over your toes. ? Keep your lower legs upright so they are parallel with the table legs. ? Do not let your hips go lower than your knees. ? Do not bend lower than told by your health care provider. ? If your  knee pain increases, do not bend as low. 3. Hold the squat position for __________ seconds. 4. Slowly push with your legs to return to standing. Do not use your hands to pull yourself to standing. Repeat __________ times. Complete this exercise __________ times a day. Exercise J: Wall Slides (Quadriceps)  1. Lean your back against a smooth wall or door while you walk your feet out 18-24 inches (46-61 cm) from it. 2. Place your feet hip-width apart. 3. Slowly slide down the wall or door until your knees bend  __________ degrees. Keep your knees over your heels, not over your toes. Keep your knees in line with your hips. 4. Hold for __________ seconds. Repeat __________ times. Complete this exercise __________ times a day. Exercise K: Straight Leg Raises - Hip Abductors 1. Lie on your side with your left / right leg in the top position. Lie so your head, shoulder, knee, and hip line up. You may bend your bottom knee to help you keep your balance. 2. Roll your hips slightly forward so your hips are stacked directly over each other and your left / right knee is facing forward. 3. Leading with your heel, lift your top leg 4-6 inches (10-15 cm). You should feel the muscles in your outer hip lifting. ? Do not let your foot drift forward. ? Do not let your knee roll toward the ceiling. 4. Hold this position for __________ seconds. 5. Slowly return your leg to the starting position. 6. Let your muscles relax completely after each repetition. Repeat __________ times. Complete this exercise __________ times a day. Exercise L: Straight Leg Raises - Hip Extensors 1. Lie on your abdomen on a firm surface. You can put a pillow under your hips if that is more comfortable. 2. Tense the muscles in your buttocks and lift your left / right leg about 4-6 inches (10-15 cm). Keep your knee straight as you lift your leg. 3. Hold this position for __________ seconds. 4. Slowly lower your leg to the starting  position. 5. Let your leg relax completely after each repetition. Repeat __________ times. Complete this exercise __________ times a day. This information is not intended to replace advice given to you by your health care provider. Make sure you discuss any questions you have with your health care provider. Document Released: 04/14/2005 Document Revised: 02/23/2016 Document Reviewed: 04/06/2015 Elsevier Interactive Patient Education  2018 [place]. Absalom Aro M.D.

## 2017-12-29 ENCOUNTER — Ambulatory Visit: Payer: 59 | Admitting: Internal Medicine

## 2017-12-29 VITALS — BP 140/90 | HR 83 | Temp 98.6°F | Wt 259.0 lb

## 2017-12-29 DIAGNOSIS — G8929 Other chronic pain: Secondary | ICD-10-CM

## 2017-12-29 DIAGNOSIS — M25562 Pain in left knee: Secondary | ICD-10-CM

## 2017-12-29 DIAGNOSIS — R400 Somnolence: Secondary | ICD-10-CM

## 2017-12-29 DIAGNOSIS — M25572 Pain in left ankle and joints of left foot: Secondary | ICD-10-CM

## 2017-12-29 DIAGNOSIS — R0683 Snoring: Secondary | ICD-10-CM

## 2017-12-29 DIAGNOSIS — M25561 Pain in right knee: Secondary | ICD-10-CM | POA: Diagnosis not present

## 2017-12-29 NOTE — Patient Instructions (Addendum)
Will do a referral  To pulmonary  For sleep evaluation.    And sports  medicine  For   Left foot ankle issue.and knees.   Knee may be   Patellar  Tracking problem  And  Can respond to   Exercise program for the knees and I addition to  Lowing weight.     Knee Exercises Ask your health care provider which exercises are safe for you. Do exercises exactly as told by your health care provider and adjust them as directed. It is normal to feel mild stretching, pulling, tightness, or discomfort as you do these exercises, but you should stop right away if you feel sudden pain or your pain gets worse.Do not begin these exercises until told by your health care provider. STRETCHING AND RANGE OF MOTION EXERCISES These exercises warm up your muscles and joints and improve the movement and flexibility of your knee. These exercises also help to relieve pain, numbness, and tingling. Exercise A: Knee Extension, Prone 1. Lie on your abdomen on a bed. 2. Place your left / right knee just beyond the edge of the surface so your knee is not on the bed. You can put a towel under your left / right thigh just above your knee for comfort. 3. Relax your leg muscles and allow gravity to straighten your knee. You should feel a stretch behind your left / right knee. 4. Hold this position for __________ seconds. 5. Scoot up so your knee is supported between repetitions. Repeat __________ times. Complete this stretch __________ times a day. Exercise B: Knee Flexion, Active  1. Lie on your back with both knees straight. If this causes back discomfort, bend your left / right knee so your foot is flat on the floor. 2. Slowly slide your left / right heel back toward your buttocks until you feel a gentle stretch in the front of your knee or thigh. 3. Hold this position for __________ seconds. 4. Slowly slide your left / right heel back to the starting position. Repeat __________ times. Complete this exercise __________ times  a day. Exercise C: Quadriceps, Prone  1. Lie on your abdomen on a firm surface, such as a bed or padded floor. 2. Bend your left / right knee and hold your ankle. If you cannot reach your ankle or pant leg, loop a belt around your foot and grab the belt instead. 3. Gently pull your heel toward your buttocks. Your knee should not slide out to the side. You should feel a stretch in the front of your thigh and knee. 4. Hold this position for __________ seconds. Repeat __________ times. Complete this stretch __________ times a day. Exercise D: Hamstring, Supine 1. Lie on your back. 2. Loop a belt or towel over the ball of your left / right foot. The ball of your foot is on the walking surface, right under your toes. 3. Straighten your left / right knee and slowly pull on the belt to raise your leg until you feel a gentle stretch behind your knee. ? Do not let your left / right knee bend while you do this. ? Keep your other leg flat on the floor. 4. Hold this position for __________ seconds. Repeat __________ times. Complete this stretch __________ times a day. STRENGTHENING EXERCISES These exercises build strength and endurance in your knee. Endurance is the ability to use your muscles for a long time, even after they get tired. Exercise E: Quadriceps, Isometric  1. Lie on your back  with your left / right leg extended and your other knee bent. Put a rolled towel or small pillow under your knee if told by your health care provider. 2. Slowly tense the muscles in the front of your left / right thigh. You should see your kneecap slide up toward your hip or see increased dimpling just above the knee. This motion will push the back of the knee toward the floor. 3. For __________ seconds, keep the muscle as tight as you can without increasing your pain. 4. Relax the muscles slowly and completely. Repeat __________ times. Complete this exercise __________ times a day. Exercise F: Straight Leg Raises -  Quadriceps 1. Lie on your back with your left / right leg extended and your other knee bent. 2. Tense the muscles in the front of your left / right thigh. You should see your kneecap slide up or see increased dimpling just above the knee. Your thigh may even shake a bit. 3. Keep these muscles tight as you raise your leg 4-6 inches (10-15 cm) off the floor. Do not let your knee bend. 4. Hold this position for __________ seconds. 5. Keep these muscles tense as you lower your leg. 6. Relax your muscles slowly and completely after each repetition. Repeat __________ times. Complete this exercise __________ times a day. Exercise G: Hamstring, Isometric 1. Lie on your back on a firm surface. 2. Bend your left / right knee approximately __________ degrees. 3. Dig your left / right heel into the surface as if you are trying to pull it toward your buttocks. Tighten the muscles in the back of your thighs to dig as hard as you can without increasing any pain. 4. Hold this position for __________ seconds. 5. Release the tension gradually and allow your muscles to relax completely for __________ seconds after each repetition. Repeat __________ times. Complete this exercise __________ times a day. Exercise H: Hamstring Curls  If told by your health care provider, do this exercise while wearing ankle weights. Begin with __________ weights. Then increase the weight by 1 lb (0.5 kg) increments. Do not wear ankle weights that are more than __________. 1. Lie on your abdomen with your legs straight. 2. Bend your left / right knee as far as you can without feeling pain. Keep your hips flat against the floor. 3. Hold this position for __________ seconds. 4. Slowly lower your leg to the starting position.  Repeat __________ times. Complete this exercise __________ times a day. Exercise I: Squats (Quadriceps) 1. Stand in front of a table, with your feet and knees pointing straight ahead. You may rest your hands on  the table for balance but not for support. 2. Slowly bend your knees and lower your hips like you are going to sit in a chair. ? Keep your weight over your heels, not over your toes. ? Keep your lower legs upright so they are parallel with the table legs. ? Do not let your hips go lower than your knees. ? Do not bend lower than told by your health care provider. ? If your knee pain increases, do not bend as low. 3. Hold the squat position for __________ seconds. 4. Slowly push with your legs to return to standing. Do not use your hands to pull yourself to standing. Repeat __________ times. Complete this exercise __________ times a day. Exercise J: Wall Slides (Quadriceps)  1. Lean your back against a smooth wall or door while you walk your feet out 18-24 inches (46-61  cm) from it. 2. Place your feet hip-width apart. 3. Slowly slide down the wall or door until your knees bend __________ degrees. Keep your knees over your heels, not over your toes. Keep your knees in line with your hips. 4. Hold for __________ seconds. Repeat __________ times. Complete this exercise __________ times a day. Exercise K: Straight Leg Raises - Hip Abductors 1. Lie on your side with your left / right leg in the top position. Lie so your head, shoulder, knee, and hip line up. You may bend your bottom knee to help you keep your balance. 2. Roll your hips slightly forward so your hips are stacked directly over each other and your left / right knee is facing forward. 3. Leading with your heel, lift your top leg 4-6 inches (10-15 cm). You should feel the muscles in your outer hip lifting. ? Do not let your foot drift forward. ? Do not let your knee roll toward the ceiling. 4. Hold this position for __________ seconds. 5. Slowly return your leg to the starting position. 6. Let your muscles relax completely after each repetition. Repeat __________ times. Complete this exercise __________ times a day. Exercise L:  Straight Leg Raises - Hip Extensors 1. Lie on your abdomen on a firm surface. You can put a pillow under your hips if that is more comfortable. 2. Tense the muscles in your buttocks and lift your left / right leg about 4-6 inches (10-15 cm). Keep your knee straight as you lift your leg. 3. Hold this position for __________ seconds. 4. Slowly lower your leg to the starting position. 5. Let your leg relax completely after each repetition. Repeat __________ times. Complete this exercise __________ times a day. This information is not intended to replace advice given to you by your health care provider. Make sure you discuss any questions you have with your health care provider. Document Released: 04/14/2005 Document Revised: 02/23/2016 Document Reviewed: 04/06/2015 Elsevier Interactive Patient Education  2018 [employer].

## 2018-01-03 ENCOUNTER — Encounter: Payer: Self-pay | Admitting: Sports Medicine

## 2018-01-03 ENCOUNTER — Ambulatory Visit: Payer: 59 | Admitting: Sports Medicine

## 2018-01-03 ENCOUNTER — Ambulatory Visit (INDEPENDENT_AMBULATORY_CARE_PROVIDER_SITE_OTHER): Payer: 59

## 2018-01-03 ENCOUNTER — Ambulatory Visit: Payer: Self-pay

## 2018-01-03 VITALS — BP 128/86 | HR 74 | Ht 68.5 in | Wt 261.8 lb

## 2018-01-03 DIAGNOSIS — M25561 Pain in right knee: Secondary | ICD-10-CM

## 2018-01-03 DIAGNOSIS — Z9889 Other specified postprocedural states: Secondary | ICD-10-CM | POA: Diagnosis not present

## 2018-01-03 DIAGNOSIS — G8929 Other chronic pain: Secondary | ICD-10-CM

## 2018-01-03 DIAGNOSIS — M7660 Achilles tendinitis, unspecified leg: Secondary | ICD-10-CM

## 2018-01-03 DIAGNOSIS — M25562 Pain in left knee: Secondary | ICD-10-CM

## 2018-01-03 DIAGNOSIS — M6788 Other specified disorders of synovium and tendon, other site: Secondary | ICD-10-CM

## 2018-01-03 DIAGNOSIS — R29898 Other symptoms and signs involving the musculoskeletal system: Secondary | ICD-10-CM

## 2018-01-03 DIAGNOSIS — M222X1 Patellofemoral disorders, right knee: Secondary | ICD-10-CM

## 2018-01-03 DIAGNOSIS — M222X2 Patellofemoral disorders, left knee: Secondary | ICD-10-CM

## 2018-01-03 MED ORDER — NITROGLYCERIN 0.2 MG/HR TD PT24: MEDICATED_PATCH | TRANSDERMAL | 1 refills | Status: DC

## 2018-01-03 MED ORDER — DICLOFENAC SODIUM 2 % TD SOLN: 1.0000 "application " | Freq: Two times a day (BID) | TRANSDERMAL | 2 refills | Status: DC

## 2018-01-03 MED ORDER — DICLOFENAC SODIUM 2 % TD SOLN: 1.0000 "application " | Freq: Two times a day (BID) | TRANSDERMAL | 0 refills | Status: AC

## 2018-01-03 NOTE — Progress Notes (Signed)
Sharon Myers, [place] at [facility] [id number]  Sharon Myers - 47 y.o. female MRN [id number]  Date of birth: [birth year]  Visit Date: 01/03/2018  PCP: Burnis Medin, MD   Referred by: Burnis Medin, MD  Scribe(s) for today's visit: Josepha Pigg, CMA  SUBJECTIVE:  Sharon Myers is here for Initial Assessment (bilateral knee pain, L ankle pain) .  Referred by: Dr. [name]  01/03/2018: Bilateral knee pain Her knee pain symptoms INITIALLY: Began about 1 year ago and MOI is known. Family hx of "bad knees". No past injury to either knee, used to be very athletic.  Described as mild aching, radiating to bilateral calf pain, some water retention.  Worsened with squatting, bending Improved with rest Additional associated symptoms include: She c/o "rice crispies" in her knees, popping and cracking. She denies swelling around the knees. She has noticed increased weakness in both knees, when she starts to sit down she reports, "I just drop".  At this time symptoms are worsening compared to onset. She has been taking IBU prn with minimal relief.     L achilles pain Her achilles pain symptoms INITIALLY: Began 1-2 months ago and MOI is unknown. She has hx of achilles rupture about 10 yrs ago.  Described as mild-severe aching/throbbing/popping, radiating to the L LE Worsened with going up and down stairs.  Improved with rest Additional associated symptoms include: She c/o popping sensation in R calf. Pain is mostly medial.    At this time symptoms show no change compared to onset.  She has been stretching and taking IBU prn with minimal relief.    REVIEW OF SYSTEMS: Denies night time disturbances. Denies fevers, chills, or night sweats. Denies unexplained weight loss. Denies personal history of cancer. Denies changes in bowel or bladder habits. Denies recent unreported falls. Denies new or worsening dyspnea or  wheezing. Denies headaches or dizziness.  Reports weakness in both knees.  Denies dizziness or presyncopal episodes Denies lower extremity edema    HISTORY & PERTINENT PRIOR DATA:  Prior History reviewed and updated per electronic medical record.  Significant/pertinent history, findings, studies include:  reports that she has never smoked. She has never used smokeless tobacco. No results for input(s): HGBA1C, LABURIC, CREATINE in the last 8760 hours. No specialty comments available. Problem  Achilles Tendinosis  S/P Achilles Tendon Repair    OBJECTIVE:  VS:  HT:5' 8.5" (174 cm)   WT:261 lb 12.8 oz (118.8 kg)  BMI:39.22    BP:128/86  HR:74bpm  TEMP: ( )  RESP:96 %   PHYSICAL EXAM: Constitutional: WDWN, Non-toxic appearing. Psychiatric: Alert & appropriately interactive.  Not depressed or anxious appearing. Respiratory: No increased work of breathing.  Trachea Midline Eyes: Pupils are equal.  EOM intact without nystagmus.  No scleral icterus  Vascular Exam: warm to touch no edema  lower extremity neuro exam: unremarkable normal sensation normal reflexes  MSK Exam: Bilateral knees are overall well aligned with positive patellar grind.  She has a small amount of medial and lateral joint line pain bilaterally without mechanical symptoms with McMurray's.  She has had abduction strength that is 4 out of 5 on the right greater than left.  Left Achilles has a well-healed surgical incision and moderate thickening of the Achilles without defect.  Dorsiflexion to 115 degrees.  Ankle plantar flexion, toe, inversion and eversion strength is 5 out of 5.   ASSESSMENT & PLAN:   1. Chronic pain of both  knees   2. Achilles tendon pain   3. Achilles tendinosis   4. S/P Achilles tendon repair   5. Patellofemoral pain syndrome of both knees   6. Weakness of both hips     PLAN: Discussed the foundation of treatment for this condition is physical therapy and/or daily (5-6 days/week)  therapeutic exercises, focusing on core strengthening, coordination, neuromuscular control/reeducation.  Therapeutic exercises prescribed per procedure note.  Topical Pennsaid for the knees as well as therapeutic exercises recommended.  Can consider injections in the future.  She has only mild degenerative changes mainly patellofemoral joint.  Her left Achilles has well-healed postsurgical scar but has marked thickening and neovascular changes within the peritenon only.  No interstitial changes.  Discussed options with the patient today including biologic treatment with topical nitroglycerin. Patient has no contraindications & understands the risks, benefits and intentions of treatment. Emphasized the importance of rotating sites as well as appropriate and expected adverse reactions including orthostasis, headache, adhesive sensitivity.  Begin with 1/4 patch to the affected area. Okay to titrate to half a patch as tolerated.  Follow-up: Return in about 6 weeks (around 02/14/2018).      Please see additional documentation for Objective, Assessment and Plan sections. Pertinent additional documentation may be included in corresponding procedure notes, imaging studies, problem based documentation and patient instructions. Please see these sections of the encounter for additional information regarding this visit.  CMA/ATC served as Education administrator during this visit. History, Physical, and Plan performed by medical provider. Documentation and orders reviewed and attested to.      Gerda Diss, [facility] Physician

## 2018-01-03 NOTE — Patient Instructions (Addendum)
[place] instructions for Duexis, Pennsaid and Vimovo:  Your prescription will be filled through a mail order pharmacy.  It is typically [place] but may vary depending on where you live.  You will receive a phone call from them which will typically come from a [phone] phone number.  You must speak directly to them to have this medication filled.  When the pharmacy calls, they will need your mailing address (for overnight shipment of the medication) andy they will need payment information if you have a copay (typically no more than $10). If you have not heard from them 2-3 days after your appointment with Dr. Paulla Fore, contact us at the office [phone]) or through [facility] so we can reach back out to the pharmacy.   Pennsaid instructions: You have been given a sample/prescription for Pennsaid, a topical medication.     You are to apply this gel to your injured body part twice daily (morning and evening).   A little goes a long way so you can use about a pea-sized amount for each area.   Spread this small amount over the area into a thin film and let it dry.   Be sure that you do not rub the gel into your skin for more than 10 or 15 seconds otherwise it can irritate you skin.    Once you apply the gel, please do not put any other lotion or clothing in contact with that area for 30 minutes to allow the gel to absorb into your skin.   Some people are sensitive to the medication and can develop a sunburn-like rash.  If you have only mild symptoms it is okay to continue to use the medication but if you have any breakdown of your skin you should discontinue its use and please let us know.   If you have been written a prescription for Pennsaid, you will receive a pump bottle of this topical gel through a mail order pharmacy.  The instructions on the bottle will say to apply two pumps twice a day which may be too much gel for your particular area so use the pea-sized amount as your  guide.   Nitroglycerin Protocol   Apply 1/4 nitroglycerin patch to affected area daily.  Change position of patch within the affected area every 24 hours.  You may experience a headache during the first 1-2 weeks of using the patch, these should subside.  If you experience headaches after beginning nitroglycerin patch treatment, you may take your preferred over the counter pain reliever.  Another side effect of the nitroglycerin patch is skin irritation or rash related to patch adhesive.  Please notify our office if you develop more severe headaches or rash, and stop the patch.  Tendon healing with nitroglycerin patch may require 12 to 24 weeks depending on the extent of injury.  Men should not use if taking Viagra, Cialis, or Levitra.   Do not use if you have migraines or rosacea.    Please perform the exercise program that we have prepared for you and gone over in detail on a daily basis.  In addition to the handout you were provided you can access your program through: [email or web address]   Your unique program code is: [email or web address]

## 2018-01-03 NOTE — Procedures (Signed)
LIMITED MSK ULTRASOUND OF Left ankle Images were obtained and interpreted by myself, Teresa Coombs, DO  Images have been saved and stored to PACS system. Images obtained on: GE S7 Ultrasound machine  FINDINGS:   Surgical changes of the Achilles with marked tissue disruption and loss of fibrillar structure of the Achilles but well-healed changes.  There is increased neovascularity within the peritenon but without interstitial changes.  There is hypoechoic change within the actual tendon belly.  Sutures are appreciated.  IMPRESSION:  1. Postsurgical changes of the Achilles with marked thickening and chronic tendinosis.  Minimal neovascularity

## 2018-01-03 NOTE — Progress Notes (Signed)
PROCEDURE NOTE: THERAPEUTIC EXERCISES (97110) 15 minutes spent for Therapeutic exercises as below and as referenced in the AVS.  This included exercises focusing on stretching, strengthening, with significant focus on eccentric aspects.   Proper technique shown and discussed handout in great detail with ATC.  All questions were discussed and answered.   Long term goals include an improvement in range of motion, strength, endurance as well as avoiding reinjury. Frequency of visits is one time as determined during today's  office visit. Frequency of exercises to be performed is as per handout.  EXERCISES REVIEWED:  Hip ABduction strengthening with focus on Glute Medius Recruitment  VMO Strengthening  [place] Protocol

## 2018-01-20 ENCOUNTER — Ambulatory Visit: Payer: 59 | Admitting: Pulmonary Disease

## 2018-01-20 ENCOUNTER — Encounter: Payer: Self-pay | Admitting: Pulmonary Disease

## 2018-01-20 VITALS — BP 124/82 | HR 70 | Ht 69.0 in | Wt 261.0 lb

## 2018-01-20 DIAGNOSIS — G4733 Obstructive sleep apnea (adult) (pediatric): Secondary | ICD-10-CM | POA: Diagnosis not present

## 2018-01-20 NOTE — Progress Notes (Signed)
Sharon Myers    [id number]    Jul 26, 1970  Primary Care Physician:Panosh, Standley Brooking, MD  Referring Physician: Burnis Medin, MD [place], [place] [address]  Chief complaint:   History of snoring, daytime sleepiness  HPI:  History of snoring, daytime fatigue 5 to 10 pound weight gain recently Occasionally with nonrestorative sleep Usually goes to bed about 11:49 at night, wakes up about 6 AM, sleeping in about 10 minutes Wakes up about 2-3 times a night Occasionally with difficulty concentrating  Work involves being on a computer, she will usually not fall asleep while working, but on occasion she would not have to take a break to take a nap which might last about an hour She feels rejuvenated with naps  Occupation: No pertinent history Exposures: No exposure to mold Smoking history: Never smoker  Outpatient Encounter Medications as of 01/20/2018  Medication Sig  . IBUPROFEN PO Take by mouth.  . nitroGLYCERIN (NITRODUR - DOSED IN MG/24 HR) 0.2 mg/hr patch Place 1/4 to 1/2 of a patch over affected region. Remove and replace once daily.  Slightly alter skin placement daily  . sertraline (ZOLOFT) 50 MG tablet Take 1 tablet (50 mg total) by mouth daily.  . [DISCONTINUED] Diclofenac Sodium (PENNSAID) 2 % SOLN Place 1 application onto the skin 2 (two) times daily.   No facility-administered encounter medications on file as of 01/20/2018.     Allergies as of 01/20/2018  . (No Known Allergies)    Past Medical History:  Diagnosis Date  . Allergy   . AMA (advanced maternal age) multigravida 48+   . Depression   . Family history of blood clots 05/22/2013  . Goiter, unspecified   . History of chicken pox   . Hx of varicella   . Other ankle sprain and strain    achilles rupture  . Ruptured lumbar disc   . 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 ACHILLES TENDON REPAIR     Left  softball injury Dr. [name]  . BACK SURGERY     hirsch  . SKIN GRAFT      Family History  Problem Relation Age of Onset  . Cancer Mother        rare blood cancer  . Stroke Mother   . Heart failure Mother   . Hypertension Father   . Nephrolithiasis Father   . Diabetes Father   . COPD Father   . Cancer Father        kidney  . Thyroid disease Maternal Aunt   . Arthritis Maternal Grandmother        rheumatoid  . Heart disease Maternal Grandfather   . Cancer Maternal Grandfather        stomach  . Cancer Paternal Grandfather        stomach ca    Social History   Socioeconomic History  . Marital status: Married    Spouse name: Not on file  . Number of children: Not on file  . Years of education: Not on file  . Highest education level: Not on file  Occupational History  . Not on file  Social Needs  . Financial resource strain: Not on file  . Food insecurity:    Worry: Not on file    Inability: Not on file  . Transportation needs:    Medical: Not on file    Non-medical:  Not on file  Tobacco Use  . Smoking status: Never Smoker  . Smokeless tobacco: Never Used  Substance and Sexual Activity  . Alcohol use: Yes    Comment: occasionally  . Drug use: No  . Sexual activity: Yes    Comment: tubal if CS  Lifestyle  . Physical activity:    Days per week: Not on file    Minutes per session: Not on file  . Stress: Not on file  Relationships  . Social connections:    Talks on phone: Not on file    Gets together: Not on file    Attends religious service: Not on file    Active member of club or organization: Not on file    Attends meetings of clubs or organizations: Not on file    Relationship status: Not on file  . Intimate partner violence:    Fear of current or ex partner: Not on file    Emotionally abused: Not on file    Physically abused: Not on file    Forced sexual activity: Not on file  Other Topics Concern  . Not on file  Social History Narrative    Usually receives 6 hours of sleep at night   4 people living in the home. No pets   Married 2 young children   Bachelors degree [employer] 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  HENT: Negative.  History of enlarged thyroid Eyes: Negative for blurred vision.  Respiratory: as per HPI  Cardiovascular: Negative for chest pain and palpitations.  Gastrointestinal: Negative for vomiting, diarrhea, blood per rectum. Genitourinary: Negative for dysuria, urgency, frequency and hematuria.  Musculoskeletal: Foot pain Skin: Negative for itching and rash.  Neurological: Negative for dizziness, tremors, focal weakness, seizures and loss of consciousness.  Endo/Heme/Allergies: Negative for environmental allergies.  Enlarged thyroid Psychiatric/Behavioral: History of depression-treated All other systems reviewed and are negative.  Physical Exam:  Vitals:   01/20/18 1140  BP: 124/82  Pulse: 70  SpO2: 96%   Gen:      No acute distress HEENT:  EOMI, sclera anicteric Neck:     No masses; no thyromegaly, Mallampati 2 Lungs:    Clear to auscultation bilaterally; normal respiratory effort CV:         Regular rate and rhythm; no murmurs Abd:      + bowel sounds; soft, non-tender; no palpable masses, no distension Ext:    No edema; adequate peripheral perfusion Skin:      Warm and dry; no rash Neuro: alert and oriented x 3 Psych: normal mood and affect  Data Reviewed: [name] Kitchen  Records reviewed, labs reviewed  Assessment:  .  High probability of significant obstructive sleep apnea  .  Hypersomnia  .  Obesity  .  Plan/Recommendations:  .  Recommend a home sleep study  .  Weight loss encouraged  .  Pathophysiology of sleep disordered breathing discussed  .  Options of treatment discussed including CPAP  therapy, oral device, surgical options  .  I will see her back in the office about 6 to 8 weeks following treatment initiation  Sherrilyn Rist MD [facility] 01/20/2018, 12:18 PM  CC: Panosh, Standley Brooking, MD

## 2018-01-20 NOTE — Patient Instructions (Signed)
High probability of significant sleep disordered breathing  Daytime sleepiness   We will recommend a home sleep study  Auto titrating CPAP will be an option of treatment   We will see you back in the office in 6 to 8 weeks after study

## 2018-02-07 ENCOUNTER — Other Ambulatory Visit: Payer: Self-pay | Admitting: Sports Medicine

## 2018-02-14 ENCOUNTER — Ambulatory Visit: Payer: 59 | Admitting: Sports Medicine

## 2018-02-15 DIAGNOSIS — G4733 Obstructive sleep apnea (adult) (pediatric): Secondary | ICD-10-CM | POA: Diagnosis not present

## 2018-02-16 DIAGNOSIS — G4733 Obstructive sleep apnea (adult) (pediatric): Secondary | ICD-10-CM

## 2018-02-17 ENCOUNTER — Telehealth: Payer: Self-pay | Admitting: Pulmonary Disease

## 2018-02-17 DIAGNOSIS — G4733 Obstructive sleep apnea (adult) (pediatric): Secondary | ICD-10-CM

## 2018-02-17 NOTE — Telephone Encounter (Signed)
Dr. Ander Slade has reviewed the home sleep test this showed Mild obstructive sleep apnea.   Recommendations   Treatment options are CPAP with the settings auto 5 to 15.    Weight loss measures .   Advise against driving while sleepy & against medication with sedative side effects.    Make appointment for 8 to 10 weeks for compliance with download with Dr. Ander Slade.   Atc left message to call back.

## 2018-02-17 NOTE — Telephone Encounter (Signed)
Left message for patient to call back  

## 2018-02-17 NOTE — Telephone Encounter (Signed)
Patient returning call - she can be reached at [phone]

## 2018-02-20 ENCOUNTER — Other Ambulatory Visit: Payer: Self-pay | Admitting: *Deleted

## 2018-02-20 DIAGNOSIS — G4733 Obstructive sleep apnea (adult) (pediatric): Secondary | ICD-10-CM

## 2018-02-21 DIAGNOSIS — Z01419 Encounter for gynecological examination (general) (routine) without abnormal findings: Secondary | ICD-10-CM | POA: Diagnosis not present

## 2018-02-21 DIAGNOSIS — Z6838 Body mass index (BMI) 38.0-38.9, adult: Secondary | ICD-10-CM | POA: Diagnosis not present

## 2018-02-21 DIAGNOSIS — Z1231 Encounter for screening mammogram for malignant neoplasm of breast: Secondary | ICD-10-CM | POA: Diagnosis not present

## 2018-02-23 NOTE — Telephone Encounter (Signed)
Pt is calling back [phone]

## 2018-02-23 NOTE — Telephone Encounter (Signed)
Called and spoke with patient regarding results.  Informed the patient of results and recommendations today. Placed DME order for new cpap machine to [facility]. Scheduled 31mo f/u with AO on  Pt verbalized understanding and denied any questions or concerns at this time.  Nothing further needed.

## 2018-03-21 ENCOUNTER — Ambulatory Visit: Payer: Self-pay | Admitting: Pulmonary Disease

## 2018-03-24 DIAGNOSIS — G4733 Obstructive sleep apnea (adult) (pediatric): Secondary | ICD-10-CM | POA: Diagnosis not present

## 2018-04-01 DIAGNOSIS — R05 Cough: Secondary | ICD-10-CM | POA: Diagnosis not present

## 2018-04-01 DIAGNOSIS — J209 Acute bronchitis, unspecified: Secondary | ICD-10-CM | POA: Diagnosis not present

## 2018-04-01 DIAGNOSIS — J0140 Acute pansinusitis, unspecified: Secondary | ICD-10-CM | POA: Diagnosis not present

## 2018-04-01 DIAGNOSIS — Z23 Encounter for immunization: Secondary | ICD-10-CM | POA: Diagnosis not present

## 2018-04-11 ENCOUNTER — Encounter (INDEPENDENT_AMBULATORY_CARE_PROVIDER_SITE_OTHER): Payer: Self-pay | Admitting: Family Medicine

## 2018-04-12 NOTE — Telephone Encounter (Signed)
Please confirm with patient.

## 2018-04-20 ENCOUNTER — Encounter (INDEPENDENT_AMBULATORY_CARE_PROVIDER_SITE_OTHER): Payer: 59

## 2018-04-24 DIAGNOSIS — G4733 Obstructive sleep apnea (adult) (pediatric): Secondary | ICD-10-CM | POA: Diagnosis not present

## 2018-05-02 ENCOUNTER — Ambulatory Visit (INDEPENDENT_AMBULATORY_CARE_PROVIDER_SITE_OTHER): Payer: 59 | Admitting: Family Medicine

## 2018-05-02 ENCOUNTER — Encounter (INDEPENDENT_AMBULATORY_CARE_PROVIDER_SITE_OTHER): Payer: Self-pay | Admitting: Family Medicine

## 2018-05-02 VITALS — BP 133/84 | HR 60 | Temp 97.9°F | Ht 69.0 in | Wt 260.0 lb

## 2018-05-02 DIAGNOSIS — R0602 Shortness of breath: Secondary | ICD-10-CM

## 2018-05-02 DIAGNOSIS — R739 Hyperglycemia, unspecified: Secondary | ICD-10-CM | POA: Diagnosis not present

## 2018-05-02 DIAGNOSIS — R5383 Other fatigue: Secondary | ICD-10-CM | POA: Diagnosis not present

## 2018-05-02 DIAGNOSIS — Z9189 Other specified personal risk factors, not elsewhere classified: Secondary | ICD-10-CM | POA: Diagnosis not present

## 2018-05-02 DIAGNOSIS — Z1331 Encounter for screening for depression: Secondary | ICD-10-CM | POA: Diagnosis not present

## 2018-05-02 DIAGNOSIS — Z6838 Body mass index (BMI) 38.0-38.9, adult: Secondary | ICD-10-CM

## 2018-05-02 DIAGNOSIS — Z0289 Encounter for other administrative examinations: Secondary | ICD-10-CM

## 2018-05-03 LAB — CBC WITH DIFFERENTIAL
BASOS: 0 %
Basophils Absolute: 0 10*3/uL (ref 0.0–0.2)
EOS (ABSOLUTE): 0.1 10*3/uL (ref 0.0–0.4)
EOS: 2 %
HEMATOCRIT: 43.7 % (ref 34.0–46.6)
Hemoglobin: 14.4 g/dL (ref 11.1–15.9)
IMMATURE GRANS (ABS): 0 10*3/uL (ref 0.0–0.1)
Immature Granulocytes: 0 %
LYMPHS: 33 %
Lymphocytes Absolute: 2.3 10*3/uL (ref 0.7–3.1)
MCH: 29.4 pg (ref 26.6–33.0)
MCHC: 33 g/dL (ref 31.5–35.7)
MCV: 89 fL (ref 79–97)
MONOS ABS: 0.6 10*3/uL (ref 0.1–0.9)
Monocytes: 9 %
NEUTROS ABS: 3.8 10*3/uL (ref 1.4–7.0)
NEUTROS PCT: 56 %
RBC: 4.89 x10E6/uL (ref 3.77–5.28)
RDW: 12.9 % (ref 12.3–15.4)
WBC: 6.9 10*3/uL (ref 3.4–10.8)

## 2018-05-03 LAB — LIPID PANEL WITH LDL/HDL RATIO
CHOLESTEROL TOTAL: 188 mg/dL (ref 100–199)
HDL: 56 mg/dL (ref >39)
LDL CALC: 110 mg/dL — AB (ref 0–99)
LDl/HDL Ratio: 2 ratio (ref 0.0–3.2)
Triglycerides: 108 mg/dL (ref 0–149)
VLDL Cholesterol Cal: 22 mg/dL (ref 5–40)

## 2018-05-03 LAB — COMPREHENSIVE METABOLIC PANEL
ALBUMIN: 4.5 g/dL (ref 3.5–5.5)
ALT: 21 IU/L (ref 0–32)
AST: 18 IU/L (ref 0–40)
Albumin/Globulin Ratio: 1.9 (ref 1.2–2.2)
Alkaline Phosphatase: 88 IU/L (ref 39–117)
BUN / CREAT RATIO: 14 (ref 9–23)
BUN: 11 mg/dL (ref 6–24)
Bilirubin Total: 0.5 mg/dL (ref 0.0–1.2)
CALCIUM: 9.7 mg/dL (ref 8.7–10.2)
CO2: 24 mmol/L (ref 20–29)
CREATININE: 0.81 mg/dL (ref 0.57–1.00)
Chloride: 99 mmol/L (ref 96–106)
GFR calc Af Amer: 100 mL/min/{1.73_m2} (ref >59)
GFR, EST NON AFRICAN AMERICAN: 87 mL/min/{1.73_m2} (ref >59)
GLOBULIN, TOTAL: 2.4 g/dL (ref 1.5–4.5)
Glucose: 108 mg/dL — ABNORMAL HIGH (ref 65–99)
Potassium: 4.2 mmol/L (ref 3.5–5.2)
SODIUM: 139 mmol/L (ref 134–144)
Total Protein: 6.9 g/dL (ref 6.0–8.5)

## 2018-05-03 LAB — T3: T3, Total: 101 ng/dL (ref 71–180)

## 2018-05-03 LAB — HEMOGLOBIN A1C
Est. average glucose Bld gHb Est-mCnc: 111 mg/dL
Hgb A1c MFr Bld: 5.5 % (ref 4.8–5.6)

## 2018-05-03 LAB — T4, FREE: FREE T4: 1.25 ng/dL (ref 0.82–1.77)

## 2018-05-03 LAB — FOLATE: Folate: 13.9 ng/mL (ref >3.0)

## 2018-05-03 LAB — TSH: TSH: 1.38 u[IU]/mL (ref 0.450–4.500)

## 2018-05-03 LAB — INSULIN, RANDOM: INSULIN: 11.5 u[IU]/mL (ref 2.6–24.9)

## 2018-05-03 LAB — VITAMIN D 25 HYDROXY (VIT D DEFICIENCY, FRACTURES): Vit D, 25-Hydroxy: 29.5 ng/mL — ABNORMAL LOW (ref 30.0–100.0)

## 2018-05-03 LAB — VITAMIN B12: VITAMIN B 12: 356 pg/mL (ref 232–1245)

## 2018-05-03 NOTE — Progress Notes (Signed)
Office: [phone]  /  Fax: [phone]   Dear Dr. Regis Bill,   Thank you for referring Sharon Myers to our clinic. The following note includes my evaluation and treatment recommendations.  HPI:   Chief Complaint: OBESITY    Sharon Myers has been referred by Standley Brooking. Panosh, MD for consultation regarding her obesity and obesity related comorbidities.    Sharon Myers (MR# [id number]) is a 47 y.o. female who presents on 05/02/2018 for obesity evaluation and treatment. Current BMI is Body mass index is 38.4 kg/m.Sharon Myers has been struggling with her weight for many years and has been unsuccessful in either losing weight, maintaining weight loss, or reaching her healthy weight goal.     Sharon Myers attended our information session and states she is currently in the action stage of change and ready to dedicate time achieving and maintaining a healthier weight. Sharon Myers is interested in becoming our patient and working on intensive lifestyle modifications including (but not limited to) diet, exercise and weight loss.    Sharon Myers states her family eats meals together she thinks her family will eat healthier with  her her desired weight loss is 60 lbs she started gaining weight 9 years ago her heaviest weight ever was 263 lbs she is a picky eater and doesn't like to eat healthier foods  she has significant food cravings issues  she is frequently drinking liquids with calories she frequently makes poor food choices she frequently eats larger portions than normal  she struggles with emotional eating    Fatigue [name] feels her energy is lower than it should be. This has worsened with weight gain and has not worsened recently. [name] admits to daytime somnolence and  admits to waking up still tired. Patient is at risk for obstructive sleep apnea. Patent has a history of symptoms of daytime fatigue and morning headache. Patient generally gets 6 hours of sleep per night, and states  they generally have nightime awakenings. Snoring is present. Apneic episodes are present. Epworth Sleepiness Score is 12.  Dyspnea on exertion [name] notes increasing shortness of breath with exercising and seems to be worsening over time with weight gain. She notes getting out of breath sooner with activity than she used to. This has not gotten worse recently. [name] denies orthopnea.  Hyperglycemia Sharon Myers has a history of some mildly elevated blood glucose readings without a diagnosis of diabetes. She admits to polyphagia, worse in the PM.  Depression Screen Sharon Myers's Food and Mood (modified PHQ-9) score was  Depression screen PHQ 2/9 05/02/2018  Decreased Interest 3  Down, Depressed, Hopeless 2  PHQ - 2 Score 5  Altered sleeping 3  Tired, decreased energy 3  Change in appetite 2  Feeling bad or failure about yourself  1  Trouble concentrating 1  Moving slowly or fidgety/restless 0  Suicidal thoughts 0  PHQ-9 Score 15  Difficult doing work/chores Somewhat difficult   At risk for cardiovascular disease Corrine is at a higher than average risk for cardiovascular disease due to obesity. She currently denies any chest pain.  ALLERGIES: No Known Allergies  MEDICATIONS: Current Outpatient Medications on File Prior to Visit  Medication Sig Dispense Refill  . buPROPion (WELLBUTRIN XL) 150 MG 24 hr tablet Take 150 mg by mouth daily.    . Prenatal Vit-Fe Fumarate-FA (MULTIVITAMIN-PRENATAL) 27-0.8 MG TABS tablet Take 1 tablet by mouth daily at 12 noon.    . sertraline (ZOLOFT) 25 MG tablet Take 25 mg by mouth at bedtime.     No  current facility-administered medications on file prior to visit.     PAST MEDICAL HISTORY: Past Medical History:  Diagnosis Date  . Allergy   . AMA (advanced maternal age) multigravida 10+   . Anxiety   . Back pain   . Depression   . Dyspnea   . Family history of blood clots 05/22/2013  . Fatigue   . Goiter, unspecified   . History of  chicken pox   . Hx of varicella   . Joint pain    knees and ankles ache  . OSA on CPAP   . Other ankle sprain and strain    achilles rupture  . Poor posture   . Poor sleep   . Ruptured lumbar disc   . Snoring   . 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 ACHILLES TENDON REPAIR     Left softball injury Dr. [name]  . BACK SURGERY     hirsch  . SKIN GRAFT      SOCIAL HISTORY: Social History   Tobacco Use  . Smoking status: Never Smoker  . Smokeless tobacco: Never Used  Substance Use Topics  . Alcohol use: Yes    Comment: occasionally  . Drug use: No    FAMILY HISTORY: Family History  Problem Relation Age of Onset  . Cancer Mother        rare blood cancer  . Stroke Mother   . Heart failure Mother   . Hypertension Father   . Nephrolithiasis Father   . Diabetes Father   . COPD Father   . Cancer Father        kidney  . Hyperlipidemia Father   . Sudden death Father   . Sleep apnea Father   . Alcoholism Father   . Obesity Father   . Thyroid disease Maternal Aunt   . Arthritis Maternal Grandmother        rheumatoid  . Heart disease Maternal Grandfather   . Cancer Maternal Grandfather        stomach  . Cancer Paternal Grandfather        stomach ca    ROS: Review of Systems  Constitutional: Positive for malaise/fatigue. Negative for weight loss.  HENT:       + Hay fever + Decreased hearing  Eyes:       + Wear glasses or contacts + Floaters  Respiratory: Positive for shortness of breath (with exertion).   Cardiovascular: Negative for chest pain and orthopnea.  Musculoskeletal: Positive for back pain.       + Muscle or joint pain  Skin:       + Dryness  Endo/Heme/Allergies: Bruises/bleeds easily.       Positive polyphagia + cold intolerance  Psychiatric/Behavioral: Positive for depression. Negative for suicidal ideas.    PHYSICAL EXAM: Blood pressure 133/84,  pulse 60, temperature 97.9 F (36.6 C), temperature source Oral, height 5\' 9"  (1.753 m), weight 260 lb (117.9 kg), last menstrual period 04/28/2018, SpO2 99 %. Body mass index is 38.4 kg/m. Physical Exam  Constitutional: She is oriented to person, place, and time. She appears well-developed and well-nourished.  HENT:  Head: Normocephalic and atraumatic.  Nose: Nose normal.  Eyes: EOM are normal. No scleral icterus.  Neck: Normal range of motion. Neck supple. No thyromegaly present.  Cardiovascular: Normal rate and regular rhythm.  Pulmonary/Chest: Effort normal. No respiratory distress.  Abdominal: Soft. There is no tenderness.  +  Obesity  Musculoskeletal:  Range of Motion normal in all 4 extremities Trace edema noted in bilateral lower extremities  Neurological: She is alert and oriented to person, place, and time. Coordination normal.  Skin: Skin is warm and dry.  Psychiatric: She has a normal mood and affect. Her behavior is normal.  Vitals reviewed.   RECENT LABS AND TESTS: BMET    Component Value Date/Time   NA 139 05/02/2018 1108   K 4.2 05/02/2018 1108   CL 99 05/02/2018 1108   CO2 24 05/02/2018 1108   GLUCOSE 108 (H) 05/02/2018 1108   GLUCOSE 106 (H) 10/21/2016 0805   BUN 11 05/02/2018 1108   CREATININE 0.81 05/02/2018 1108   CALCIUM 9.7 05/02/2018 1108   GFRNONAA 87 05/02/2018 1108   GFRAA 100 05/02/2018 1108   Lab Results  Component Value Date   HGBA1C 5.5 05/02/2018   Lab Results  Component Value Date   INSULIN 11.5 05/02/2018   CBC    Component Value Date/Time   WBC 6.9 05/02/2018 1108   WBC 6.4 10/21/2016 0805   RBC 4.89 05/02/2018 1108   RBC 4.72 10/21/2016 0805   HGB 14.4 05/02/2018 1108   HCT 43.7 05/02/2018 1108   PLT 227.0 10/21/2016 0805   MCV 89 05/02/2018 1108   MCH 29.4 05/02/2018 1108   MCH 29.3 06/28/2011 0520   MCHC 33.0 05/02/2018 1108   MCHC 32.3 10/21/2016 0805   RDW 12.9 05/02/2018 1108   LYMPHSABS 2.3 05/02/2018 1108    MONOABS 0.6 10/21/2016 0805   EOSABS 0.1 05/02/2018 1108   BASOSABS 0.0 05/02/2018 1108   Iron/TIBC/Ferritin/ %Sat    Component Value Date/Time   FERRITIN 5.3 (L) 10/21/2016 0805   Lipid Panel     Component Value Date/Time   CHOL 188 05/02/2018 1108   TRIG 108 05/02/2018 1108   HDL 56 05/02/2018 1108   CHOLHDL 3 10/21/2016 0805   VLDL 14.0 10/21/2016 0805   LDLCALC 110 (H) 05/02/2018 1108   Hepatic Function Panel     Component Value Date/Time   PROT 6.9 05/02/2018 1108   ALBUMIN 4.5 05/02/2018 1108   AST 18 05/02/2018 1108   ALT 21 05/02/2018 1108   ALKPHOS 88 05/02/2018 1108   BILITOT 0.5 05/02/2018 1108   BILIDIR 0.1 10/21/2016 0805      Component Value Date/Time   TSH 1.380 05/02/2018 1108   TSH 1.38 10/21/2016 0805   TSH 1.51 01/08/2016   TSH 1.58 12/30/2014 0804    ECG  shows NSR with a rate of 61 BPM INDIRECT CALORIMETER done today shows a VO2 of 312 and a REE of 2171.  Her calculated basal metabolic rate is 6789 thus her basal metabolic rate is better than expected.    ASSESSMENT AND PLAN: Other fatigue - Plan: EKG 12-Lead, Comprehensive metabolic panel, CBC With Differential, Lipid Panel With LDL/HDL Ratio, VITAMIN D 25 Hydroxy (Vit-D Deficiency, Fractures), Vitamin B12, Folate, T3, T4, free, TSH  SOB (shortness of breath) on exertion - Plan: Lipid Panel With LDL/HDL Ratio, VITAMIN D 25 Hydroxy (Vit-D Deficiency, Fractures), Vitamin B12, Folate, T3, T4, free, TSH  Hyperglycemia - Plan: Comprehensive metabolic panel, CBC With Differential, Hemoglobin A1c, Insulin, random, Lipid Panel With LDL/HDL Ratio  Depression screening  At risk for heart disease  Class 2 severe obesity with serious comorbidity and body mass index (BMI) of 38.0 to 38.9 in adult, unspecified obesity type (HCC)  PLAN:  Fatigue Cariah was informed that her fatigue may be related to obesity,  depression or many other causes. Labs will be ordered, and in the meanwhile Azlynn has  agreed to work on diet, exercise and weight loss to help with fatigue. Proper sleep hygiene was discussed including the need for 7-8 hours of quality sleep each night. A sleep study was not ordered based on symptoms and Epworth score.  Dyspnea on exertion Yalitza's shortness of breath appears to be obesity related and exercise induced. She has agreed to work on weight loss and gradually increase exercise to treat her exercise induced shortness of breath. If Josiah follows our instructions and loses weight without improvement of her shortness of breath, we will plan to refer to pulmonology. We will monitor this condition regularly. Remas agrees to this plan.  Hyperglycemia Fasting labs will be obtained and results with be discussed with Loma Sousa in 2 weeks at her follow up visit. In the meanwhile Kylin was started on a lower simple carbohydrate diet and will work on weight loss efforts.  Depression Screen Kalijah had a strongly positive depression screening. Depression is commonly associated with obesity and often results in emotional eating behaviors. We will monitor this closely and work on CBT to help improve the non-hunger eating patterns. Referral to Psychology may be required if no improvement is seen as she continues in our clinic.  Cardiovascular risk counselling Zaidee was given extended (15 minutes) coronary artery disease prevention counseling today. She is 47 y.o. female and has risk factors for heart disease including obesity. We discussed intensive lifestyle modifications today with an emphasis on specific weight loss instructions and strategies. Pt was also informed of the importance of increasing exercise and decreasing saturated fats to help prevent heart disease.  Obesity Trudie is currently in the action stage of change and her goal is to continue with weight loss efforts. I recommend Hellon begin the structured treatment plan as follows:  She has agreed to follow  the Category 3 plan Annalicia has been instructed to eventually work up to a goal of 150 minutes of combined cardio and strengthening exercise per week for weight loss and overall health benefits. We discussed the following Behavioral Modification Strategies today: increasing lean protein intake, decreasing simple carbohydrates  and work on meal planning and easy cooking plans   She was informed of the importance of frequent follow up visits to maximize her success with intensive lifestyle modifications for her multiple health conditions. She was informed we would discuss her lab results at her next visit unless there is a critical issue that needs to be addressed sooner. Tifini agreed to keep her next visit at the agreed upon time to discuss these results.    OBESITY BEHAVIORAL INTERVENTION VISIT  Today's visit was # 1   Starting weight: 260 lbs Starting date: 05/02/18 Today's weight : 260 lbs Today's date: 05/02/2018 Total lbs lost to date: 0    ASK: We discussed the diagnosis of obesity with [name] today and Chloie agreed to give [place] permission to discuss obesity behavioral modification therapy today.  ASSESS: Beauty has the diagnosis of obesity and her BMI today is 38.38 Bryla is in the action stage of change   ADVISE: Jabree was educated on the multiple health risks of obesity as well as the benefit of weight loss to improve her health. She was advised of the need for long term treatment and the importance of lifestyle modifications to improve her current health and to decrease her risk of future health problems.  AGREE: Multiple dietary modification options and treatment  options were discussed and  Karlin agreed to follow the recommendations documented in the above note.  ARRANGE: Ayris was educated on the importance of frequent visits to treat obesity as outlined per CMS and USPSTF guidelines and agreed to schedule her next follow up appointment  today.  I, Trixie Dredge, am acting as transcriptionist for Dennard Nip, MD   I have reviewed the above documentation for accuracy and completeness, and I agree with the above. -Dennard Nip, MD

## 2018-05-05 ENCOUNTER — Encounter: Payer: Self-pay | Admitting: Pulmonary Disease

## 2018-05-05 ENCOUNTER — Ambulatory Visit: Payer: 59 | Admitting: Pulmonary Disease

## 2018-05-05 VITALS — BP 132/78 | HR 67 | Ht 69.0 in | Wt 266.0 lb

## 2018-05-05 DIAGNOSIS — G4733 Obstructive sleep apnea (adult) (pediatric): Secondary | ICD-10-CM | POA: Diagnosis not present

## 2018-05-05 DIAGNOSIS — Z9989 Dependence on other enabling machines and devices: Secondary | ICD-10-CM

## 2018-05-05 NOTE — Patient Instructions (Signed)
Mild obstructive sleep apnea adequately treated with CPAP therapy Compliance data shows good compliance with adequate treatment of sleep disordered breathing  Continue using CPAP regularly Continue with weight loss efforts  Will see you back in the office in about 6 months Call with any significant concerns

## 2018-05-05 NOTE — Progress Notes (Signed)
Sharon Myers    [id number]    08/20/1970  Primary Care Physician:Panosh, Standley Brooking, MD  Referring Physician: Burnis Medin, MD [place], [place] [address]  Chief complaint:   Daytime sleepiness Symptoms are improved since starting CPAP therapy  HPI: She did have a sleep study on-revealed mild obstructive sleep apnea She has since started CPAP therapy-auto titrating CPAP-pressure settings of 5-15, median pressure of 10, 95th percentile pressure of 12.8  She feels better waking up in the morning Has increased efforts at weight loss She feels as sleepiness adequate with CPAP use  Usually goes to bed about 11 PM at night, wakes up about 6 AM, sleeping in about 10 minutes She is not waking up as much with CPAP use  Work involves being on a computer, she will usually not fall asleep while working, but on occasion she would not have to take a break to take a nap which might last about an hour She feels rejuvenated with naps  Occupation: No pertinent history Exposures: No exposure to mold Smoking history: Never smoker  Outpatient Encounter Medications as of 05/05/2018  Medication Sig  . buPROPion (WELLBUTRIN XL) 150 MG 24 hr tablet Take 150 mg by mouth daily.  . Prenatal Vit-Fe Fumarate-FA (MULTIVITAMIN-PRENATAL) 27-0.8 MG TABS tablet Take 1 tablet by mouth daily at 12 noon.  . sertraline (ZOLOFT) 25 MG tablet Take 25 mg by mouth at bedtime.   No facility-administered encounter medications on file as of 05/05/2018.     Allergies as of 05/05/2018  . (No Known Allergies)    Past Medical History:  Diagnosis Date  . Allergy   . AMA (advanced maternal age) multigravida 57+   . Anxiety   . Back pain   . Depression   . Dyspnea   . Family history of blood clots 05/22/2013  . Fatigue   . Goiter, unspecified   . History of chicken pox   . Hx of varicella   . Joint pain    knees and ankles ache  . OSA on CPAP   . Other ankle sprain and strain      achilles rupture  . Poor posture   . Poor sleep   . Ruptured lumbar disc   . Snoring   . 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 ACHILLES TENDON REPAIR     Left softball injury Dr. [name]  . BACK SURGERY     hirsch  . SKIN GRAFT      Family History  Problem Relation Age of Onset  . Cancer Mother        rare blood cancer  . Stroke Mother   . Heart failure Mother   . Hypertension Father   . Nephrolithiasis Father   . Diabetes Father   . COPD Father   . Cancer Father        kidney  . Hyperlipidemia Father   . Sudden death Father   . Sleep apnea Father   . Alcoholism Father   . Obesity Father   . Thyroid disease Maternal Aunt   . Arthritis Maternal Grandmother        rheumatoid  . Heart disease Maternal Grandfather   . Cancer Maternal Grandfather        stomach  . Cancer Paternal Grandfather        stomach ca    Social History  Socioeconomic History  . Marital status: Married    Spouse name: [name]  . Number of children: 2  . Years of education: Not on file  . Highest education level: Not on file  Occupational History  . Occupation: Work from home  Social Needs  . Financial resource strain: Not on file  . Food insecurity:    Worry: Not on file    Inability: Not on file  . Transportation needs:    Medical: Not on file    Non-medical: Not on file  Tobacco Use  . Smoking status: Never Smoker  . Smokeless tobacco: Never Used  Substance and Sexual Activity  . Alcohol use: Yes    Comment: occasionally  . Drug use: No  . Sexual activity: Yes    Comment: tubal if CS  Lifestyle  . Physical activity:    Days per week: Not on file    Minutes per session: Not on file  . Stress: Not on file  Relationships  . Social connections:    Talks on phone: Not on file    Gets together: Not on file    Attends religious service: Not on file    Active member of club or organization: Not on file     Attends meetings of clubs or organizations: Not on file    Relationship status: Not on file  . Intimate partner violence:    Fear of current or ex partner: Not on file    Emotionally abused: Not on file    Physically abused: Not on file    Forced sexual activity: Not on file  Other Topics Concern  . Not on file  Social History Narrative   Usually receives 6 hours of sleep at night   4 people living in the home. 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  Gastrointestinal: Negative for vomiting, diarrhea, blood per rectum. Genitourinary: Negative for dysuria, urgency, frequency and hematuria.  Musculoskeletal: Foot pain Psychiatric/Behavioral: History of depression-treated All other systems reviewed and are negative.  Physical Exam:  Vitals:   05/05/18 0907  BP: 132/78  Pulse: 67  SpO2: 97%   Gen:      No acute distress HEENT: Pupils equal and reactive Neck:     No masses; no thyromegaly, Mallampati 2 Lungs:    Clear to auscultation bilaterally; normal respiratory effort CV:         S1-S2 appreciated with no murmur Psych: normal mood and affect  Data Reviewed: [name] Kitchen  Records reviewed, labs reviewed .  Home sleep study reviewed--AHI of 11.1 .  Compliance data reveals 90% compliance, about 5 three-quarter hours of sleep nightly, residual AHI 1.5  Assessment:  .  Mild obstructive sleep apnea  .  Hypersomnia -Improving symptoms are present  .  Obesity -She has increased weight loss efforts .  Plan/Recommendations:  .  Continue CPAP use  .  Weight loss encouraged  .  Encouraged to continue with weight loss efforts  including exercise  .  Optimal attempts at getting at least 6 to 7 hours of sleep every night  .  I will see her back in the office about 6 months  .  Call with any significant concerns  Sherrilyn Rist MD [facility] 05/05/2018, 9:26 AM  CC: Panosh, Standley Brooking, MD

## 2018-05-08 ENCOUNTER — Encounter (INDEPENDENT_AMBULATORY_CARE_PROVIDER_SITE_OTHER): Payer: Self-pay | Admitting: Family Medicine

## 2018-05-17 ENCOUNTER — Encounter (INDEPENDENT_AMBULATORY_CARE_PROVIDER_SITE_OTHER): Payer: Self-pay | Admitting: Family Medicine

## 2018-05-17 ENCOUNTER — Ambulatory Visit (INDEPENDENT_AMBULATORY_CARE_PROVIDER_SITE_OTHER): Payer: 59 | Admitting: Family Medicine

## 2018-05-17 VITALS — BP 130/83 | HR 66 | Temp 98.1°F | Ht 69.0 in | Wt 254.0 lb

## 2018-05-17 DIAGNOSIS — R7303 Prediabetes: Secondary | ICD-10-CM | POA: Diagnosis not present

## 2018-05-17 DIAGNOSIS — E559 Vitamin D deficiency, unspecified: Secondary | ICD-10-CM

## 2018-05-17 DIAGNOSIS — Z6837 Body mass index (BMI) 37.0-37.9, adult: Secondary | ICD-10-CM

## 2018-05-17 DIAGNOSIS — Z9189 Other specified personal risk factors, not elsewhere classified: Secondary | ICD-10-CM

## 2018-05-17 MED ORDER — VITAMIN D (ERGOCALCIFEROL) 1.25 MG (50000 UNIT) PO CAPS: 50000.0000 [IU] | ORAL_CAPSULE | ORAL | 0 refills | Status: DC

## 2018-05-22 NOTE — Progress Notes (Signed)
Office: [phone]  /  Fax: [phone]   HPI:   Chief Complaint: OBESITY Sharon Myers is here to discuss her progress with her obesity treatment plan. She is on the Category 3 plan and is following her eating plan approximately 60 to 70 % of the time. She states she is exercising 0 minutes 0 times per week. [name] did very well with her Category 3 plan and managed well over Thanksgiving. She notes that her hunger is improved, but she did have some cravings.  Her weight is 254 lb (115.2 kg) today and has had a weight loss of 6 pounds over a period of 2 weeks since her last visit. She has lost 6 lbs since starting treatment with [place].  Vitamin D deficiency Sharon Myers has a diagnosis of vitamin D deficiency. She is not currently taking vit D and is not at goal. She admits fatigue and denies nausea, vomiting, or muscle weakness.  Pre-Diabetes Sharon Myers has a diagnosis of pre-diabetes based on her elevated Hgb A1c and was informed this puts her at greater risk of developing diabetes. She has an increased glucose and fasting Insulin. She is not taking metformin currently and continues to work on diet and exercise to decrease risk of diabetes. She admits polyphagia, but this has improved on her diet prescription.  At risk for diabetes Sharon Myers is at higher than average risk for developing diabetes due to her pre-diabetes and obesity. She currently denies polyuria or polydipsia.  ALLERGIES: No Known Allergies  MEDICATIONS: Current Outpatient Medications on File Prior to Visit  Medication Sig Dispense Refill  . buPROPion (WELLBUTRIN XL) 150 MG 24 hr tablet Take 150 mg by mouth daily.    . Prenatal Vit-Fe Fumarate-FA (MULTIVITAMIN-PRENATAL) 27-0.8 MG TABS tablet Take 1 tablet by mouth daily at 12 noon.    . sertraline (ZOLOFT) 25 MG tablet Take 25 mg by mouth at bedtime.     No current facility-administered medications on file prior to visit.     PAST MEDICAL HISTORY: Past Medical History:    Diagnosis Date  . Allergy   . AMA (advanced maternal age) multigravida 33+   . Anxiety   . Back pain   . Depression   . Dyspnea   . Family history of blood clots 05/22/2013  . Fatigue   . Goiter, unspecified   . History of chicken pox   . Hx of varicella   . Joint pain    knees and ankles ache  . OSA on CPAP   . Other ankle sprain and strain    achilles rupture  . Poor posture   . Poor sleep   . Ruptured lumbar disc   . Snoring   . 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 ACHILLES TENDON REPAIR     Left softball injury Dr. [name]  . BACK SURGERY     hirsch  . SKIN GRAFT      SOCIAL HISTORY: Social History   Tobacco Use  . Smoking status: Never Smoker  . Smokeless tobacco: Never Used  Substance Use Topics  . Alcohol use: Yes    Comment: occasionally  . Drug use: No    FAMILY HISTORY: Family History  Problem Relation Age of Onset  . Cancer Mother        rare blood cancer  . Stroke Mother   . Heart failure Mother   . Hypertension Father   . Nephrolithiasis  Father   . Diabetes Father   . COPD Father   . Cancer Father        kidney  . Hyperlipidemia Father   . Sudden death Father   . Sleep apnea Father   . Alcoholism Father   . Obesity Father   . Thyroid disease Maternal Aunt   . Arthritis Maternal Grandmother        rheumatoid  . Heart disease Maternal Grandfather   . Cancer Maternal Grandfather        stomach  . Cancer Paternal Grandfather        stomach ca    ROS: Review of Systems  Constitutional: Positive for malaise/fatigue and weight loss.  Gastrointestinal: Negative for nausea and vomiting.  Genitourinary:       Negative for polyuria.  Musculoskeletal:       Negative for muscle weakness.  Endo/Heme/Allergies: Negative for polydipsia.       Positive for polyphagia.    PHYSICAL EXAM: Blood pressure 130/83, pulse 66, temperature 98.1 F (36.7  C), temperature source Oral, height 5\' 9"  (1.753 m), weight 254 lb (115.2 kg), last menstrual period 04/28/2018, SpO2 97 %. Body mass index is 37.51 kg/m. Physical Exam  Constitutional: She is oriented to person, place, and time. She appears well-developed and well-nourished.  Cardiovascular: Normal rate.  Musculoskeletal: Normal range of motion.  Neurological: She is oriented to person, place, and time.  Skin: Skin is warm and dry.  Psychiatric: She has a normal mood and affect. Her behavior is normal.  Vitals reviewed.   RECENT LABS AND TESTS: BMET    Component Value Date/Time   NA 139 05/02/2018 1108   K 4.2 05/02/2018 1108   CL 99 05/02/2018 1108   CO2 24 05/02/2018 1108   GLUCOSE 108 (H) 05/02/2018 1108   GLUCOSE 106 (H) 10/21/2016 0805   BUN 11 05/02/2018 1108   CREATININE 0.81 05/02/2018 1108   CALCIUM 9.7 05/02/2018 1108   GFRNONAA 87 05/02/2018 1108   GFRAA 100 05/02/2018 1108   Lab Results  Component Value Date   HGBA1C 5.5 05/02/2018   HGBA1C 6.1 10/21/2016   HGBA1C 5.4 01/08/2016   Lab Results  Component Value Date   INSULIN 11.5 05/02/2018   CBC    Component Value Date/Time   WBC 6.9 05/02/2018 1108   WBC 6.4 10/21/2016 0805   RBC 4.89 05/02/2018 1108   RBC 4.72 10/21/2016 0805   HGB 14.4 05/02/2018 1108   HCT 43.7 05/02/2018 1108   PLT 227.0 10/21/2016 0805   MCV 89 05/02/2018 1108   MCH 29.4 05/02/2018 1108   MCH 29.3 06/28/2011 0520   MCHC 33.0 05/02/2018 1108   MCHC 32.3 10/21/2016 0805   RDW 12.9 05/02/2018 1108   LYMPHSABS 2.3 05/02/2018 1108   MONOABS 0.6 10/21/2016 0805   EOSABS 0.1 05/02/2018 1108   BASOSABS 0.0 05/02/2018 1108   Iron/TIBC/Ferritin/ %Sat    Component Value Date/Time   FERRITIN 5.3 (L) 10/21/2016 0805   Lipid Panel     Component Value Date/Time   CHOL 188 05/02/2018 1108   TRIG 108 05/02/2018 1108   HDL 56 05/02/2018 1108   CHOLHDL 3 10/21/2016 0805   VLDL 14.0 10/21/2016 0805   LDLCALC 110 (H) 05/02/2018  1108   Hepatic Function Panel     Component Value Date/Time   PROT 6.9 05/02/2018 1108   ALBUMIN 4.5 05/02/2018 1108   AST 18 05/02/2018 1108   ALT 21 05/02/2018 1108   ALKPHOS 88  05/02/2018 1108   BILITOT 0.5 05/02/2018 1108   BILIDIR 0.1 10/21/2016 0805      Component Value Date/Time   TSH 1.380 05/02/2018 1108   TSH 1.38 10/21/2016 0805   TSH 1.51 01/08/2016   TSH 1.58 12/30/2014 0804   Results for SUNSHYNE, HORVATH (MRN [id number]) as of 05/22/2018 07:38  Ref. Range 05/02/2018 11:08  Vitamin D, 25-Hydroxy Latest Ref Range: 30.0 - 100.0 ng/mL 29.5 (L)   ASSESSMENT AND PLAN: Vitamin D deficiency - Plan: Vitamin D, Ergocalciferol, (DRISDOL) 1.25 MG (50000 UT) CAPS capsule  Prediabetes  At risk for diabetes mellitus  Class 2 severe obesity with serious comorbidity and body mass index (BMI) of 37.0 to 37.9 in adult, unspecified obesity type ([place])  PLAN:  Vitamin D Deficiency Kuulei was informed that low vitamin D levels contributes to fatigue and are associated with obesity, breast, and colon cancer. She agrees to start to take prescription Vit D @50 ,000 IU every week #4 with no refills and will follow up for routine testing of vitamin D, at least 2-3 times per year. She was informed of the risk of over-replacement of vitamin D and agrees to not increase her dose unless she discusses this with [place] first. We will recheck labs in 3 months and Lattie agrees to follow up in 2 weeks.  Pre-Diabetes Christabella will continue to work on weight loss, exercise, and decreasing simple carbohydrates in her diet to help decrease the risk of diabetes. She was informed that eating too many simple carbohydrates or too many calories at one sitting increases the likelihood of GI side effects. We will recheck labs in 3 weeks. Forrestine agreed to continue her diet and exercise and she will follow up with [place] as directed to monitor her progress.  Diabetes risk counseling Jimma was given extended (30  minutes) diabetes prevention counseling today. She is 47 y.o. female and has risk factors for diabetes including pre-diabetes and obesity. We discussed intensive lifestyle modifications today with an emphasis on weight loss as well as increasing exercise and decreasing simple carbohydrates in her diet.  Obesity Alyshia is currently in the action stage of change. As such, her goal is to continue with weight loss efforts. She has agreed to follow the Category 3 plan and add keeping a food journal of 450 to 600 calories and 40+ grams of protein.  Omer has been instructed to work up to a goal of 150 minutes of combined cardio and strengthening exercise per week for weight loss and overall health benefits. We discussed the following Behavioral Modification Strategies today: increasing lean protein intake, decreasing simple carbohydrates, increasing vegetables, work on meal planning and easy cooking plans, and holiday eating strategies.   Illiana has agreed to follow up with our clinic in 2 weeks. She was informed of the importance of frequent follow up visits to maximize her success with intensive lifestyle modifications for her multiple health conditions.   OBESITY BEHAVIORAL INTERVENTION VISIT  Today's visit was # 2   Starting weight: 260 lbs Starting date: 05/02/18 Today's weight : Weight: 254 lb (115.2 kg)  Today's date: 05/17/2018 Total lbs lost to date: 6  ASK: We discussed the diagnosis of obesity with [name] today and Lianne agreed to give [place] permission to discuss obesity behavioral modification therapy today.  ASSESS: Vetta has the diagnosis of obesity and her BMI today is 37.51. Julitza is in the action stage of change.   ADVISE: Corazon was educated on the multiple health risks of obesity as  well as the benefit of weight loss to improve her health. She was advised of the need for long term treatment and the importance of lifestyle modifications to improve her  current health and to decrease her risk of future health problems.  AGREE: Multiple dietary modification options and treatment options were discussed and Danne agreed to follow the recommendations documented in the above note.  ARRANGE: Maryiah was educated on the importance of frequent visits to treat obesity as outlined per CMS and USPSTF guidelines and agreed to schedule her next follow up appointment today.  I, Marcille Blanco, am acting as transcriptionist for Starlyn Skeans, MD  I have reviewed the above documentation for accuracy and completeness, and I agree with the above. -Dennard Nip, MD

## 2018-05-24 DIAGNOSIS — G4733 Obstructive sleep apnea (adult) (pediatric): Secondary | ICD-10-CM | POA: Diagnosis not present

## 2018-05-31 ENCOUNTER — Ambulatory Visit (INDEPENDENT_AMBULATORY_CARE_PROVIDER_SITE_OTHER): Payer: 59 | Admitting: Family Medicine

## 2018-05-31 ENCOUNTER — Encounter (INDEPENDENT_AMBULATORY_CARE_PROVIDER_SITE_OTHER): Payer: Self-pay | Admitting: Family Medicine

## 2018-05-31 VITALS — BP 114/77 | HR 71 | Temp 97.6°F | Ht 69.0 in | Wt 248.0 lb

## 2018-05-31 DIAGNOSIS — Z9189 Other specified personal risk factors, not elsewhere classified: Secondary | ICD-10-CM

## 2018-05-31 DIAGNOSIS — Z6836 Body mass index (BMI) 36.0-36.9, adult: Secondary | ICD-10-CM | POA: Diagnosis not present

## 2018-05-31 DIAGNOSIS — E559 Vitamin D deficiency, unspecified: Secondary | ICD-10-CM | POA: Diagnosis not present

## 2018-05-31 MED ORDER — VITAMIN D (ERGOCALCIFEROL) 1.25 MG (50000 UNIT) PO CAPS: 50000.0000 [IU] | ORAL_CAPSULE | ORAL | 0 refills | Status: DC

## 2018-06-01 NOTE — Progress Notes (Signed)
Office: [phone]  /  Fax: [phone]   HPI:   Chief Complaint: OBESITY Sharon Myers is here to discuss her progress with her obesity treatment plan. She is on the Category 3 plan and is following her eating plan approximately 85 to 90 % of the time. She states she is exercising 0 minutes 0 times per week. Sharon Myers continues to do well with weight loss on her Category 3 plan. Her hunger is controlled and she is doing well with meal planning.  Her weight is 248 lb (112.5 kg) today and has had a weight loss of 6 pounds over a period of 2 weeks since her last visit. She has lost 12 lbs since starting treatment with [place].  Vitamin D deficiency Sharon Myers has a diagnosis of vitamin D deficiency. She is currently stable on vit D, but is not yet at goal. She denies nausea, vomiting, or muscle weakness.  At risk for osteopenia and osteoporosis Sharon Myers is at higher risk of osteopenia and osteoporosis due to vitamin D deficiency.   ASSESSMENT AND PLAN:  Vitamin D deficiency - Plan: Vitamin D, Ergocalciferol, (DRISDOL) 1.25 MG (50000 UT) CAPS capsule  At risk for osteoporosis  Class 2 severe obesity with serious comorbidity and body mass index (BMI) of 36.0 to 36.9 in adult, unspecified obesity type ([place])  PLAN:  Vitamin D Deficiency Sharon Myers was informed that low vitamin D levels contributes to fatigue and are associated with obesity, breast, and colon cancer. She agrees to continue to take prescription Vit D @50 ,000 IU every week #4 with no refills and will follow up for routine testing of vitamin D, at least 2-3 times per year. She was informed of the risk of over-replacement of vitamin D and agrees to not increase her dose unless she discusses this with [place] first. Sharon Myers agrees to follow up in 3 to 4 weeks.  At risk for osteopenia and osteoporosis Sharon Myers was given extended (15 minutes) osteoporosis prevention counseling today. Sharon Myers is at risk for osteopenia and osteoporosis due to her  vitamin D deficiency. She was encouraged to take her vitamin D and follow her higher calcium diet and increase strengthening exercise to help strengthen her bones and decrease her risk of osteopenia and osteoporosis.  Obesity Sharon Myers is currently in the action stage of change. As such, her goal is to continue with weight loss efforts. She has agreed to follow the Category 3 plan. Sharon Myers has been instructed to work up to a goal of 150 minutes of combined cardio and strengthening exercise per week for weight loss and overall health benefits. We discussed the following Behavioral Modification Strategies today: increasing lean protein intake, decreasing simple carbohydrates, work on meal planning and easy cooking plans, holiday eating strategies, travel eating strategies, and celebration eating strategies.  Sharon Myers has agreed to follow up with our clinic in 3 to 4 weeks. She was informed of the importance of frequent follow up visits to maximize her success with intensive lifestyle modifications for her multiple health conditions.  ALLERGIES: No Known Allergies  MEDICATIONS: Current Outpatient Medications on File Prior to Visit  Medication Sig Dispense Refill  . buPROPion (WELLBUTRIN XL) 150 MG 24 hr tablet Take 150 mg by mouth daily.    . Prenatal Vit-Fe Fumarate-FA (MULTIVITAMIN-PRENATAL) 27-0.8 MG TABS tablet Take 1 tablet by mouth daily at 12 noon.    . sertraline (ZOLOFT) 25 MG tablet Take 25 mg by mouth at bedtime.     No current facility-administered medications on file prior to visit.  PAST MEDICAL HISTORY: Past Medical History:  Diagnosis Date  . Allergy   . AMA (advanced maternal age) multigravida 17+   . Anxiety   . Back pain   . Depression   . Dyspnea   . Family history of blood clots 05/22/2013  . Fatigue   . Goiter, unspecified   . History of chicken pox   . Hx of varicella   . Joint pain    knees and ankles ache  . OSA on CPAP   . Other ankle sprain and  strain    achilles rupture  . Poor posture   . Poor sleep   . Ruptured lumbar disc   . Snoring   . 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 ACHILLES TENDON REPAIR     Left softball injury Dr. [name]  . BACK SURGERY     hirsch  . SKIN GRAFT      SOCIAL HISTORY: Social History   Tobacco Use  . Smoking status: Never Smoker  . Smokeless tobacco: Never Used  Substance Use Topics  . Alcohol use: Yes    Comment: occasionally  . Drug use: No    FAMILY HISTORY: Family History  Problem Relation Age of Onset  . Cancer Mother        rare blood cancer  . Stroke Mother   . Heart failure Mother   . Hypertension Father   . Nephrolithiasis Father   . Diabetes Father   . COPD Father   . Cancer Father        kidney  . Hyperlipidemia Father   . Sudden death Father   . Sleep apnea Father   . Alcoholism Father   . Obesity Father   . Thyroid disease Maternal Aunt   . Arthritis Maternal Grandmother        rheumatoid  . Heart disease Maternal Grandfather   . Cancer Maternal Grandfather        stomach  . Cancer Paternal Grandfather        stomach ca   ROS: Review of Systems  Constitutional: Positive for weight loss.  Gastrointestinal: Negative for nausea and vomiting.  Musculoskeletal:       Negative for muscle weakness.   PHYSICAL EXAM: Blood pressure 114/77, pulse 71, temperature 97.6 F (36.4 C), temperature source Oral, height 5\' 9"  (1.753 m), weight 248 lb (112.5 kg), SpO2 98 %. Body mass index is 36.62 kg/m. Physical Exam Vitals signs reviewed.  Constitutional:      Appearance: Normal appearance. She is obese.  Cardiovascular:     Rate and Rhythm: Normal rate.  Pulmonary:     Effort: Pulmonary effort is normal.  Musculoskeletal: Normal range of motion.  Skin:    General: Skin is warm and dry.  Neurological:     Mental Status: She is alert and oriented to person, place,  and time.  Psychiatric:        Mood and Affect: Mood normal.        Behavior: Behavior normal.    RECENT LABS AND TESTS: BMET    Component Value Date/Time   NA 139 05/02/2018 1108   K 4.2 05/02/2018 1108   CL 99 05/02/2018 1108   CO2 24 05/02/2018 1108   GLUCOSE 108 (H) 05/02/2018 1108   GLUCOSE 106 (H) 10/21/2016 0805   BUN 11 05/02/2018 1108   CREATININE 0.81 05/02/2018 1108   CALCIUM 9.7 05/02/2018  [place] 05/02/2018 1108   GFRAA 100 05/02/2018 1108   Lab Results  Component Value Date   HGBA1C 5.5 05/02/2018   HGBA1C 6.1 10/21/2016   HGBA1C 5.4 01/08/2016   Lab Results  Component Value Date   INSULIN 11.5 05/02/2018   CBC    Component Value Date/Time   WBC 6.9 05/02/2018 1108   WBC 6.4 10/21/2016 0805   RBC 4.89 05/02/2018 1108   RBC 4.72 10/21/2016 0805   HGB 14.4 05/02/2018 1108   HCT 43.7 05/02/2018 1108   PLT 227.0 10/21/2016 0805   MCV 89 05/02/2018 1108   MCH 29.4 05/02/2018 1108   MCH 29.3 06/28/2011 0520   MCHC 33.0 05/02/2018 1108   MCHC 32.3 10/21/2016 0805   RDW 12.9 05/02/2018 1108   LYMPHSABS 2.3 05/02/2018 1108   MONOABS 0.6 10/21/2016 0805   EOSABS 0.1 05/02/2018 1108   BASOSABS 0.0 05/02/2018 1108   Iron/TIBC/Ferritin/ %Sat    Component Value Date/Time   FERRITIN 5.3 (L) 10/21/2016 0805   Lipid Panel     Component Value Date/Time   CHOL 188 05/02/2018 1108   TRIG 108 05/02/2018 1108   HDL 56 05/02/2018 1108   CHOLHDL 3 10/21/2016 0805   VLDL 14.0 10/21/2016 0805   LDLCALC 110 (H) 05/02/2018 1108   Hepatic Function Panel     Component Value Date/Time   PROT 6.9 05/02/2018 1108   ALBUMIN 4.5 05/02/2018 1108   AST 18 05/02/2018 1108   ALT 21 05/02/2018 1108   ALKPHOS 88 05/02/2018 1108   BILITOT 0.5 05/02/2018 1108   BILIDIR 0.1 10/21/2016 0805      Component Value Date/Time   TSH 1.380 05/02/2018 1108   TSH 1.38 10/21/2016 0805   TSH 1.51 01/08/2016   TSH 1.58 12/30/2014 0804   Results for ELINDA, BUNTEN (MRN [id number]) as of 06/01/2018 10:34  Ref. Range 05/02/2018 11:08  Vitamin D, 25-Hydroxy Latest Ref Range: 30.0 - 100.0 ng/mL 29.5 (L)    OBESITY BEHAVIORAL INTERVENTION VISIT  Today's visit was # 3   Starting weight: 260 lbs Starting date: 05/02/18 Today's weight : Weight: 248 lb (112.5 kg)  Today's date: 05/31/2018 Total lbs lost to date: 12  ASK: We discussed the diagnosis of obesity with Sharon Myers today and Sharon Myers agreed to give [place] permission to discuss obesity behavioral modification therapy today.  ASSESS: Sharon Myers has the diagnosis of obesity and her BMI today is 36.6. Sharon Myers is in the action stage of change.   ADVISE: Sharon Myers was educated on the multiple health risks of obesity as well as the benefit of weight loss to improve her health. She was advised of the need for long term treatment and the importance of lifestyle modifications to improve her current health and to decrease her risk of future health problems.  AGREE: Multiple dietary modification options and treatment options were discussed and Sharon Myers agreed to follow the recommendations documented in the above note.  ARRANGE: Sharon Myers was educated on the importance of frequent visits to treat obesity as outlined per CMS and USPSTF guidelines and agreed to schedule her next follow up appointment today.  I, Marcille Blanco, am acting as transcriptionist for Starlyn Skeans, MD I have reviewed the above documentation for accuracy and completeness, and I agree with the above. -Dennard Nip, MD

## 2018-06-08 ENCOUNTER — Other Ambulatory Visit (INDEPENDENT_AMBULATORY_CARE_PROVIDER_SITE_OTHER): Payer: Self-pay | Admitting: Family Medicine

## 2018-06-08 DIAGNOSIS — E559 Vitamin D deficiency, unspecified: Secondary | ICD-10-CM

## 2018-06-09 ENCOUNTER — Encounter (INDEPENDENT_AMBULATORY_CARE_PROVIDER_SITE_OTHER): Payer: Self-pay | Admitting: Family Medicine

## 2018-06-13 ENCOUNTER — Encounter (INDEPENDENT_AMBULATORY_CARE_PROVIDER_SITE_OTHER): Payer: Self-pay | Admitting: Family Medicine

## 2018-06-15 ENCOUNTER — Encounter (INDEPENDENT_AMBULATORY_CARE_PROVIDER_SITE_OTHER): Payer: Self-pay

## 2018-06-24 DIAGNOSIS — G4733 Obstructive sleep apnea (adult) (pediatric): Secondary | ICD-10-CM | POA: Diagnosis not present

## 2018-06-27 ENCOUNTER — Ambulatory Visit (INDEPENDENT_AMBULATORY_CARE_PROVIDER_SITE_OTHER): Payer: 59 | Admitting: Family Medicine

## 2018-06-27 ENCOUNTER — Encounter (INDEPENDENT_AMBULATORY_CARE_PROVIDER_SITE_OTHER): Payer: Self-pay | Admitting: Family Medicine

## 2018-06-27 VITALS — BP 125/82 | HR 69 | Temp 98.1°F | Ht 69.0 in | Wt 244.0 lb

## 2018-06-27 DIAGNOSIS — Z6836 Body mass index (BMI) 36.0-36.9, adult: Secondary | ICD-10-CM | POA: Diagnosis not present

## 2018-06-27 DIAGNOSIS — E559 Vitamin D deficiency, unspecified: Secondary | ICD-10-CM | POA: Diagnosis not present

## 2018-06-27 DIAGNOSIS — Z9189 Other specified personal risk factors, not elsewhere classified: Secondary | ICD-10-CM | POA: Diagnosis not present

## 2018-06-27 MED ORDER — VITAMIN D (ERGOCALCIFEROL) 1.25 MG (50000 UNIT) PO CAPS: 50000.0000 [IU] | ORAL_CAPSULE | ORAL | 0 refills | Status: DC

## 2018-06-28 NOTE — Progress Notes (Signed)
Office: [phone]  /  Fax: [phone]   HPI:   Chief Complaint: OBESITY Sharon Myers is here to discuss her progress with her obesity treatment plan. She is on the Category 3 plan and is following her eating plan approximately 50 % of the time. She states she walked a lot at [employer]. Sharon Myers has done well with weight loss with the Category 3 plan. She is getting bored with lunch and would like to look at other options.  Her weight is 244 lb (110.7 kg) today and has had a weight loss of 4 pounds over a period of 4 weeks since her last visit. She has lost 16 lbs since starting treatment with [place].  Vitamin D deficiency Sharon Myers has a diagnosis of vitamin D deficiency. She is currently stable on vit D and denies nausea, vomiting, or muscle weakness.  At risk for osteopenia and osteoporosis Sharon Myers is at higher risk of osteopenia and osteoporosis due to vitamin D deficiency.   ASSESSMENT AND PLAN:  Vitamin D deficiency - Plan: Vitamin D, Ergocalciferol, (DRISDOL) 1.25 MG (50000 UT) CAPS capsule  At risk for osteoporosis  Class 2 severe obesity with serious comorbidity and body mass index (BMI) of 36.0 to 36.9 in adult, unspecified obesity type ([place])  PLAN:  Vitamin D Deficiency Sharon Myers was informed that low vitamin D levels contributes to fatigue and are associated with obesity, breast, and colon cancer. She agrees to continue to take prescription Vit D @50 ,000 IU every week #4 with no refills and will follow up for routine testing of vitamin D, at least 2-3 times per year. She was informed of the risk of over-replacement of vitamin D and agrees to not increase her dose unless she discusses this with [place] first. Sharon Myers agrees to follow up in 2 to 3 weeks.  At risk for osteopenia and osteoporosis Sharon Myers was given extended (15 minutes) osteoporosis prevention counseling today. Sharon Myers is at risk for osteopenia and osteoporosis due to her vitamin D deficiency. She was encouraged to  take her vitamin D and follow her higher calcium diet and increase strengthening exercise to help strengthen her bones and decrease her risk of osteopenia and osteoporosis.  Obesity Sharon Myers is currently in the action stage of change. As such, her goal is to continue with weight loss efforts. She has agreed to keep a food journal with 350 to 500 calories and 35 grams of protein for lunch and follow the Category 3 plan. Sharon Myers has been instructed to work up to a goal of 150 minutes of combined cardio and strengthening exercise per week for weight loss and overall health benefits. We discussed the following Behavioral Modification Strategies today: increasing lean protein intake, decreasing simple carbohydrates, keep a strict food journal, and work on meal planning and easy cooking plans.  Sharon Myers has agreed to follow up with our clinic in 2 to 3 weeks. She was informed of the importance of frequent follow up visits to maximize her success with intensive lifestyle modifications for her multiple health conditions.  ALLERGIES: No Known Allergies  MEDICATIONS: Current Outpatient Medications on File Prior to Visit  Medication Sig Dispense Refill  . buPROPion (WELLBUTRIN XL) 150 MG 24 hr tablet Take 150 mg by mouth daily.    . Prenatal Vit-Fe Fumarate-FA (MULTIVITAMIN-PRENATAL) 27-0.8 MG TABS tablet Take 1 tablet by mouth daily at 12 noon.    . sertraline (ZOLOFT) 25 MG tablet Take 25 mg by mouth at bedtime.     No current facility-administered medications on file prior  to visit.     PAST MEDICAL HISTORY: Past Medical History:  Diagnosis Date  . Allergy   . AMA (advanced maternal age) multigravida 69+   . Anxiety   . Back pain   . Depression   . Dyspnea   . Family history of blood clots 05/22/2013  . Fatigue   . Goiter, unspecified   . History of chicken pox   . Hx of varicella   . Joint pain    knees and ankles ache  . OSA on CPAP   . Other ankle sprain and strain    achilles  rupture  . Poor posture   . Poor sleep   . Ruptured lumbar disc   . Snoring   . 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 ACHILLES TENDON REPAIR     Left softball injury Dr. [name]  . BACK SURGERY     hirsch  . SKIN GRAFT      SOCIAL HISTORY: Social History   Tobacco Use  . Smoking status: Never Smoker  . Smokeless tobacco: Never Used  Substance Use Topics  . Alcohol use: Yes    Comment: occasionally  . Drug use: No    FAMILY HISTORY: Family History  Problem Relation Age of Onset  . Cancer Mother        rare blood cancer  . Stroke Mother   . Heart failure Mother   . Hypertension Father   . Nephrolithiasis Father   . Diabetes Father   . COPD Father   . Cancer Father        kidney  . Hyperlipidemia Father   . Sudden death Father   . Sleep apnea Father   . Alcoholism Father   . Obesity Father   . Thyroid disease Maternal Aunt   . Arthritis Maternal Grandmother        rheumatoid  . Heart disease Maternal Grandfather   . Cancer Maternal Grandfather        stomach  . Cancer Paternal Grandfather        stomach ca    ROS: Review of Systems  Constitutional: Positive for weight loss.  Gastrointestinal: Negative for nausea and vomiting.  Musculoskeletal:       Negative for muscle weakness.    PHYSICAL EXAM: Blood pressure 125/82, pulse 69, temperature 98.1 F (36.7 C), temperature source Oral, height 5\' 9"  (1.753 m), weight 244 lb (110.7 kg), last menstrual period 06/21/2018, SpO2 99 %. Body mass index is 36.03 kg/m. Physical Exam Vitals signs reviewed.  Constitutional:      Appearance: Normal appearance. She is obese.  Cardiovascular:     Rate and Rhythm: Normal rate.  Pulmonary:     Effort: Pulmonary effort is normal.  Musculoskeletal: Normal range of motion.  Skin:    General: Skin is warm and dry.  Neurological:     Mental Status: She is alert and oriented to  person, place, and time.  Psychiatric:        Mood and Affect: Mood normal.        Behavior: Behavior normal.     RECENT LABS AND TESTS: BMET    Component Value Date/Time   NA 139 05/02/2018 1108   K 4.2 05/02/2018 1108   CL 99 05/02/2018 1108   CO2 24 05/02/2018 1108   GLUCOSE 108 (H) 05/02/2018 1108   GLUCOSE 106 (H) 10/21/2016 0805   BUN 11  05/02/2018 1108   CREATININE 0.81 05/02/2018 1108   CALCIUM 9.7 05/02/2018 1108   GFRNONAA 87 05/02/2018 1108   GFRAA 100 05/02/2018 1108   Lab Results  Component Value Date   HGBA1C 5.5 05/02/2018   HGBA1C 6.1 10/21/2016   HGBA1C 5.4 01/08/2016   Lab Results  Component Value Date   INSULIN 11.5 05/02/2018   CBC    Component Value Date/Time   WBC 6.9 05/02/2018 1108   WBC 6.4 10/21/2016 0805   RBC 4.89 05/02/2018 1108   RBC 4.72 10/21/2016 0805   HGB 14.4 05/02/2018 1108   HCT 43.7 05/02/2018 1108   PLT 227.0 10/21/2016 0805   MCV 89 05/02/2018 1108   MCH 29.4 05/02/2018 1108   MCH 29.3 06/28/2011 0520   MCHC 33.0 05/02/2018 1108   MCHC 32.3 10/21/2016 0805   RDW 12.9 05/02/2018 1108   LYMPHSABS 2.3 05/02/2018 1108   MONOABS 0.6 10/21/2016 0805   EOSABS 0.1 05/02/2018 1108   BASOSABS 0.0 05/02/2018 1108   Iron/TIBC/Ferritin/ %Sat    Component Value Date/Time   FERRITIN 5.3 (L) 10/21/2016 0805   Lipid Panel     Component Value Date/Time   CHOL 188 05/02/2018 1108   TRIG 108 05/02/2018 1108   HDL 56 05/02/2018 1108   CHOLHDL 3 10/21/2016 0805   VLDL 14.0 10/21/2016 0805   LDLCALC 110 (H) 05/02/2018 1108   Hepatic Function Panel     Component Value Date/Time   PROT 6.9 05/02/2018 1108   ALBUMIN 4.5 05/02/2018 1108   AST 18 05/02/2018 1108   ALT 21 05/02/2018 1108   ALKPHOS 88 05/02/2018 1108   BILITOT 0.5 05/02/2018 1108   BILIDIR 0.1 10/21/2016 0805      Component Value Date/Time   TSH 1.380 05/02/2018 1108   TSH 1.38 10/21/2016 0805   TSH 1.51 01/08/2016   TSH 1.58 12/30/2014 0804   Results  for LAURELLA, TULL (MRN [id number]) as of 06/28/2018 08:41  Ref. Range 05/02/2018 11:08  Vitamin D, 25-Hydroxy Latest Ref Range: 30.0 - 100.0 ng/mL 29.5 (L)    OBESITY BEHAVIORAL INTERVENTION VISIT  Today's visit was # 4   Starting weight: 260 lbs Starting date: 05/02/18 Today's weight : Weight: 244 lb (110.7 kg)  Today's date: 06/27/2018 Total lbs lost to date: 16  ASK: We discussed the diagnosis of obesity with Sharon Myers today and Sharon Myers agreed to give [place] permission to discuss obesity behavioral modification therapy today.  ASSESS: Sharon Myers has the diagnosis of obesity and her BMI today is 36.0. Sharon Myers is in the action stage of change.   ADVISE: Sharon Myers was educated on the multiple health risks of obesity as well as the benefit of weight loss to improve her health. She was advised of the need for long term treatment and the importance of lifestyle modifications to improve her current health and to decrease her risk of future health problems.  AGREE: Multiple dietary modification options and treatment options were discussed and Sharon Myers agreed to follow the recommendations documented in the above note.  ARRANGE: Sharon Myers was educated on the importance of frequent visits to treat obesity as outlined per CMS and USPSTF guidelines and agreed to schedule her next follow up appointment today.  Nolon Rod, am acting as transcriptionist for Starlyn Skeans, MD  I have reviewed the above documentation for accuracy and completeness, and I agree with the above. -Dennard Nip, MD

## 2018-07-05 DIAGNOSIS — H35411 Lattice degeneration of retina, right eye: Secondary | ICD-10-CM | POA: Diagnosis not present

## 2018-07-17 ENCOUNTER — Encounter (INDEPENDENT_AMBULATORY_CARE_PROVIDER_SITE_OTHER): Payer: Self-pay | Admitting: Family Medicine

## 2018-07-17 ENCOUNTER — Ambulatory Visit (INDEPENDENT_AMBULATORY_CARE_PROVIDER_SITE_OTHER): Payer: 59 | Admitting: Family Medicine

## 2018-07-17 VITALS — BP 128/84 | HR 71 | Temp 97.9°F | Ht 69.0 in | Wt 244.0 lb

## 2018-07-17 DIAGNOSIS — E8881 Metabolic syndrome: Secondary | ICD-10-CM | POA: Diagnosis not present

## 2018-07-17 DIAGNOSIS — Z9189 Other specified personal risk factors, not elsewhere classified: Secondary | ICD-10-CM | POA: Diagnosis not present

## 2018-07-17 DIAGNOSIS — Z6836 Body mass index (BMI) 36.0-36.9, adult: Secondary | ICD-10-CM

## 2018-07-17 DIAGNOSIS — E559 Vitamin D deficiency, unspecified: Secondary | ICD-10-CM | POA: Diagnosis not present

## 2018-07-17 MED ORDER — VITAMIN D (ERGOCALCIFEROL) 1.25 MG (50000 UNIT) PO CAPS: 50000.0000 [IU] | ORAL_CAPSULE | ORAL | 0 refills | Status: DC

## 2018-07-18 ENCOUNTER — Encounter (INDEPENDENT_AMBULATORY_CARE_PROVIDER_SITE_OTHER): Payer: Self-pay | Admitting: Family Medicine

## 2018-07-18 DIAGNOSIS — Z6834 Body mass index (BMI) 34.0-34.9, adult: Secondary | ICD-10-CM

## 2018-07-18 DIAGNOSIS — E669 Obesity, unspecified: Secondary | ICD-10-CM | POA: Insufficient documentation

## 2018-07-18 DIAGNOSIS — E8881 Metabolic syndrome: Secondary | ICD-10-CM | POA: Insufficient documentation

## 2018-07-18 DIAGNOSIS — E559 Vitamin D deficiency, unspecified: Secondary | ICD-10-CM | POA: Insufficient documentation

## 2018-07-18 DIAGNOSIS — E88819 Insulin resistance, unspecified: Secondary | ICD-10-CM | POA: Insufficient documentation

## 2018-07-18 DIAGNOSIS — Z6833 Body mass index (BMI) 33.0-33.9, adult: Secondary | ICD-10-CM | POA: Insufficient documentation

## 2018-07-18 NOTE — Progress Notes (Addendum)
Office: [phone]  /  Fax: [phone]   HPI:   Chief Complaint: OBESITY Sharon Myers is here to discuss her progress with her obesity treatment plan. She is on the Category 3 plan and lunch journal of 350 to 500 calories and 35 grams of protein and is following her eating plan approximately 40 % of the time. She states she is exercising 0 minutes 0 times per week. Sharon Myers is not sticking to the plan as well as when she first started. She has cut out liquid calories such as alcohol and soda and she feels like that this was responsible for a great deal of her initial weight loss. . She likes the structure of the Category 3 plan and likes the food.  Her weight is 244 lb (110.7 kg) today and has not lost weight since her last visit. She has lost 16 lbs since starting treatment with [place].  Vitamin D deficiency Sharon Myers has a diagnosis of vitamin D deficiency. She is currently taking vit D and is not at goal. Her last vitamin D level was 29.5 on 05/02/18. She denies nausea, vomiting, or muscle weakness.  Insulin Resistance Sharon Myers has a diagnosis of insulin resistance based on her elevated fasting insulin level >5. Although Sharon Myers's blood glucose readings are still under good control, insulin resistance puts her at greater risk of metabolic syndrome and diabetes. She is not taking metformin currently and continues to work on diet and exercise to decrease risk of diabetes. She denies polyphagia.  At risk for diabetes Sharon Myers is at higher than average risk for developing diabetes due to her insulin resistance and obesity. She currently denies polyuria or polydipsia.  ASSESSMENT AND PLAN:  Vitamin D deficiency - Plan: Vitamin D, Ergocalciferol, (DRISDOL) 1.25 MG (50000 UT) CAPS capsule, DISCONTINUED: Vitamin D, Ergocalciferol, (DRISDOL) 1.25 MG (50000 UT) CAPS capsule  Insulin resistance  At risk for diabetes mellitus  Class 2 severe obesity with serious comorbidity and body mass index  (BMI) of 36.0 to 36.9 in adult, unspecified obesity type ([place])  PLAN:  Vitamin D Deficiency Sharon Myers was informed that low vitamin D levels contributes to fatigue and are associated with obesity, breast, and colon cancer. She agrees to continue to take prescription Vit D @50 ,000 IU every week #4 with no refills and will follow up for routine testing of vitamin D, at least 2-3 times per year. She was informed of the risk of over-replacement of vitamin D and agrees to not increase her dose unless she discusses this with [place] first. Sharon Myers agrees to follow up in 2 to 3 weeks.  Insulin Resistance Sharon Myers will continue to work on weight loss, exercise, and decreasing simple carbohydrates in her diet to help decrease the risk of diabetes.  She was informed that eating too many simple carbohydrates or too many calories at one sitting increases the likelihood of GI side effects. Sharon Myers agreed to continue her meal plan and to follow up with [place] as directed to monitor her progress.  Diabetes risk counseling Sharon Myers was given extended (15 minutes) diabetes prevention counseling today. She is 48 y.o. female and has risk factors for diabetes including insulin resistance and obesity. We discussed intensive lifestyle modifications today with an emphasis on weight loss as well as increasing exercise and decreasing simple carbohydrates in her diet.  Obesity Sharon Myers is currently in the action stage of change. As such, her goal is to continue with weight loss efforts. She has agreed to follow the Category 3 plan  + breakfast options  or keeping a food journal of 1500 to 1650 calories and 100 grams of protein daily. She may journal and was given the "Journaling" handout. Sharon Myers has not been prescribed exercise at this time. We discussed the following Behavioral Modification Strategies today: increasing lean protein intake, planning for success, and keeping a strict food journal.  Sharon Myers has agreed to follow  up with our clinic in 2 to 3 weeks. She was informed of the importance of frequent follow up visits to maximize her success with intensive lifestyle modifications for her multiple health conditions.  ALLERGIES: No Known Allergies  MEDICATIONS: Current Outpatient Medications on File Prior to Visit  Medication Sig Dispense Refill  . buPROPion (WELLBUTRIN XL) 150 MG 24 hr tablet Take 150 mg by mouth daily.    . Prenatal Vit-Fe Fumarate-FA (MULTIVITAMIN-PRENATAL) 27-0.8 MG TABS tablet Take 1 tablet by mouth daily at 12 noon.    . sertraline (ZOLOFT) 25 MG tablet Take 25 mg by mouth at bedtime.     No current facility-administered medications on file prior to visit.     PAST MEDICAL HISTORY: Past Medical History:  Diagnosis Date  . Allergy   . AMA (advanced maternal age) multigravida 53+   . Anxiety   . Back pain   . Depression   . Dyspnea   . Family history of blood clots 05/22/2013  . Fatigue   . Goiter, unspecified   . History of chicken pox   . Hx of varicella   . Joint pain    knees and ankles ache  . OSA on CPAP   . Other ankle sprain and strain    achilles rupture  . Poor posture   . Poor sleep   . Ruptured lumbar disc   . Snoring   . 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 ACHILLES TENDON REPAIR     Left softball injury Dr. [name]  . BACK SURGERY     hirsch  . SKIN GRAFT      SOCIAL HISTORY: Social History   Tobacco Use  . Smoking status: Never Smoker  . Smokeless tobacco: Never Used  Substance Use Topics  . Alcohol use: Yes    Comment: occasionally  . Drug use: No    FAMILY HISTORY: Family History  Problem Relation Age of Onset  . Cancer Mother        rare blood cancer  . Stroke Mother   . Heart failure Mother   . Hypertension Father   . Nephrolithiasis Father   . Diabetes Father   . COPD Father   . Cancer Father        kidney  . Hyperlipidemia Father     . Sudden death Father   . Sleep apnea Father   . Alcoholism Father   . Obesity Father   . Thyroid disease Maternal Aunt   . Arthritis Maternal Grandmother        rheumatoid  . Heart disease Maternal Grandfather   . Cancer Maternal Grandfather        stomach  . Cancer Paternal Grandfather        stomach ca   ROS: Review of Systems  Constitutional: Negative for weight loss.  Gastrointestinal: Negative for nausea and vomiting.  Musculoskeletal:       Negative for muscle weakness.  Endo/Heme/Allergies:       Negative for polyphagia.   PHYSICAL EXAM: Blood pressure 128/84, pulse  71, temperature 97.9 F (36.6 C), temperature source Oral, height 5\' 9"  (1.753 m), weight 244 lb (110.7 kg), last menstrual period 06/21/2018, SpO2 96 %. Body mass index is 36.03 kg/m. Physical Exam Vitals signs reviewed.  Constitutional:      Appearance: Normal appearance. She is obese.  Cardiovascular:     Rate and Rhythm: Normal rate.  Pulmonary:     Effort: Pulmonary effort is normal.  Musculoskeletal: Normal range of motion.  Skin:    General: Skin is warm and dry.  Neurological:     Mental Status: She is alert and oriented to person, place, and time.  Psychiatric:        Mood and Affect: Mood normal.        Behavior: Behavior normal.    RECENT LABS AND TESTS: BMET    Component Value Date/Time   NA 139 05/02/2018 1108   K 4.2 05/02/2018 1108   CL 99 05/02/2018 1108   CO2 24 05/02/2018 1108   GLUCOSE 108 (H) 05/02/2018 1108   GLUCOSE 106 (H) 10/21/2016 0805   BUN 11 05/02/2018 1108   CREATININE 0.81 05/02/2018 1108   CALCIUM 9.7 05/02/2018 1108   GFRNONAA 87 05/02/2018 1108   GFRAA 100 05/02/2018 1108   Lab Results  Component Value Date   HGBA1C 5.5 05/02/2018   HGBA1C 6.1 10/21/2016   HGBA1C 5.4 01/08/2016   Lab Results  Component Value Date   INSULIN 11.5 05/02/2018   CBC    Component Value Date/Time   WBC 6.9 05/02/2018 1108   WBC 6.4 10/21/2016 0805   RBC 4.89  05/02/2018 1108   RBC 4.72 10/21/2016 0805   HGB 14.4 05/02/2018 1108   HCT 43.7 05/02/2018 1108   PLT 227.0 10/21/2016 0805   MCV 89 05/02/2018 1108   MCH 29.4 05/02/2018 1108   MCH 29.3 06/28/2011 0520   MCHC 33.0 05/02/2018 1108   MCHC 32.3 10/21/2016 0805   RDW 12.9 05/02/2018 1108   LYMPHSABS 2.3 05/02/2018 1108   MONOABS 0.6 10/21/2016 0805   EOSABS 0.1 05/02/2018 1108   BASOSABS 0.0 05/02/2018 1108   Iron/TIBC/Ferritin/ %Sat    Component Value Date/Time   FERRITIN 5.3 (L) 10/21/2016 0805   Lipid Panel     Component Value Date/Time   CHOL 188 05/02/2018 1108   TRIG 108 05/02/2018 1108   HDL 56 05/02/2018 1108   CHOLHDL 3 10/21/2016 0805   VLDL 14.0 10/21/2016 0805   LDLCALC 110 (H) 05/02/2018 1108   Hepatic Function Panel     Component Value Date/Time   PROT 6.9 05/02/2018 1108   ALBUMIN 4.5 05/02/2018 1108   AST 18 05/02/2018 1108   ALT 21 05/02/2018 1108   ALKPHOS 88 05/02/2018 1108   BILITOT 0.5 05/02/2018 1108   BILIDIR 0.1 10/21/2016 0805      Component Value Date/Time   TSH 1.380 05/02/2018 1108   TSH 1.38 10/21/2016 0805   TSH 1.51 01/08/2016   TSH 1.58 12/30/2014 0804   Results for TRINISHA, PAGET (MRN [id number]) as of 07/18/2018 10:58  Ref. Range 05/02/2018 11:08  Vitamin D, 25-Hydroxy Latest Ref Range: 30.0 - 100.0 ng/mL 29.5 (L)    OBESITY BEHAVIORAL INTERVENTION VISIT  Today's visit was # 5   Starting weight: 260 lbs Starting date: 05/02/18 Today's weight : Weight: 244 lb (110.7 kg)  Today's date: 07/17/2018 Total lbs lost to date: 16   ASK: We discussed the diagnosis of obesity with Sharon Myers today and Sharon Myers agreed to give [place]  permission to discuss obesity behavioral modification therapy today.  ASSESS: Sharon Myers has the diagnosis of obesity and her BMI today is 36.0. Sharon Myers is in the action stage of change.   ADVISE: Sharon Myers was educated on the multiple health risks of obesity as well as the benefit of weight loss to  improve her health. She was advised of the need for long term treatment and the importance of lifestyle modifications to improve her current health and to decrease her risk of future health problems.  AGREE: Multiple dietary modification options and treatment options were discussed and Sharon Myers agreed to follow the recommendations documented in the above note.  ARRANGE: Sharon Myers was educated on the importance of frequent visits to treat obesity as outlined per CMS and USPSTF guidelines and agreed to schedule her next follow up appointment today.  Lenward Chancellor, CMA, am acting as Location manager for [employer], FNP-C.  I have reviewed the above documentation for accuracy and completeness, and I agree with the above.  - Dmitriy Gair, FNP-C.

## 2018-07-25 DIAGNOSIS — G4733 Obstructive sleep apnea (adult) (pediatric): Secondary | ICD-10-CM | POA: Diagnosis not present

## 2018-07-31 ENCOUNTER — Encounter (INDEPENDENT_AMBULATORY_CARE_PROVIDER_SITE_OTHER): Payer: Self-pay | Admitting: Family Medicine

## 2018-07-31 ENCOUNTER — Ambulatory Visit (INDEPENDENT_AMBULATORY_CARE_PROVIDER_SITE_OTHER): Payer: 59 | Admitting: Family Medicine

## 2018-07-31 VITALS — BP 125/83 | HR 65 | Temp 97.8°F | Ht 69.0 in | Wt 240.0 lb

## 2018-07-31 DIAGNOSIS — E8881 Metabolic syndrome: Secondary | ICD-10-CM | POA: Diagnosis not present

## 2018-07-31 DIAGNOSIS — Z6835 Body mass index (BMI) 35.0-35.9, adult: Secondary | ICD-10-CM | POA: Diagnosis not present

## 2018-07-31 DIAGNOSIS — E66812 Obesity, class 2: Secondary | ICD-10-CM

## 2018-07-31 DIAGNOSIS — E559 Vitamin D deficiency, unspecified: Secondary | ICD-10-CM | POA: Diagnosis not present

## 2018-07-31 DIAGNOSIS — Z9189 Other specified personal risk factors, not elsewhere classified: Secondary | ICD-10-CM

## 2018-07-31 DIAGNOSIS — E88819 Insulin resistance, unspecified: Secondary | ICD-10-CM

## 2018-07-31 MED ORDER — VITAMIN D (ERGOCALCIFEROL) 1.25 MG (50000 UNIT) PO CAPS: 50000.0000 [IU] | ORAL_CAPSULE | ORAL | 0 refills | Status: DC

## 2018-07-31 NOTE — Progress Notes (Signed)
Office: [phone]  /  Fax: [phone]   HPI:   Chief Complaint: OBESITY Sharon Myers is here to discuss her progress with her obesity treatment plan. She is on the Category 3 plan with breakfast options and is following her eating plan approximately 70 % of the time. She states she is walking 2 miles 1 time per week. Sharon Myers is eating all the food on her plan.  Her weight is 240 lb (108.9 kg) today and has had a weight loss of 4 pounds over a period of 2 weeks since her last visit. She has lost 20 lbs since starting treatment with [place].  Vitamin D deficiency Alnisa has a diagnosis of vitamin D deficiency. She is currently taking vit D and her last level was 29.5 on 05/02/18, which is not at goal. She denies nausea, vomiting, or muscle weakness.  At risk for osteopenia and osteoporosis Luca is at higher risk of osteopenia and osteoporosis due to vitamin D deficiency.   Insulin Resistance Kiela has a diagnosis of insulin resistance based on her elevated fasting insulin level >5. Although Tanysha's blood glucose readings are still under good control, insulin resistance puts her at greater risk of metabolic syndrome and diabetes. She is not taking metformin currently and she denies polyphagia. Siomara continues to work on diet and exercise to decrease risk of diabetes. Lab Results  Component Value Date   HGBA1C 5.5 05/02/2018    ASSESSMENT AND PLAN:  Vitamin D deficiency - Plan: Vitamin D, Ergocalciferol, (DRISDOL) 1.25 MG (50000 UT) CAPS capsule  Insulin resistance  At risk for osteoporosis  Class 2 severe obesity with serious comorbidity and body mass index (BMI) of 35.0 to 35.9 in adult, unspecified obesity type ([place])  PLAN:  Vitamin D Deficiency Marayah was informed that low vitamin D levels contributes to fatigue and are associated with obesity, breast, and colon cancer. Layah agrees to continue to take prescription Vit D @50 ,000 IU every week #4 with no  refills and will follow up for routine testing of vitamin D, at least 2-3 times per year. She was informed of the risk of over-replacement of vitamin D and agrees to not increase her dose unless she discusses this with [place] first. Chong agrees to follow up in 2 weeks as directed.  At risk for osteopenia and osteoporosis Jadah was given extended (15 minutes) osteoporosis prevention counseling today. Grady is at risk for osteopenia and osteoporosis due to her vitamin D deficiency. She was encouraged to take her vitamin D and follow her higher calcium diet and increase strengthening exercise to help strengthen her bones and decrease her risk of osteopenia and osteoporosis.  Insulin Resistance Alexandrya will continue to work on weight loss, exercise, and decreasing simple carbohydrates in her diet to help decrease the risk of diabetes.  Alexias agreed to continue her meal plan and will follow up with [place] as directed to monitor her progress.  Obesity Kierra is currently in the action stage of change. As such, her goal is to continue with weight loss efforts. She has agreed to keep a food journal with 1600 calories and 90 to 100 grams of protein or follow the Category 3 plan with breakfast options. Hadley has been instructed to continue walking 2 miles 1 time/week. We discussed the following Behavioral Modification Strategies today: planning for success.  Katie has agreed to follow up with our clinic in 2 weeks. She was informed of the importance of frequent follow up visits to maximize her success with intensive lifestyle  modifications for her multiple health conditions.  ALLERGIES: No Known Allergies  MEDICATIONS: Current Outpatient Medications on File Prior to Visit  Medication Sig Dispense Refill  . buPROPion (WELLBUTRIN XL) 150 MG 24 hr tablet Take 150 mg by mouth daily.    . Prenatal Vit-Fe Fumarate-FA (MULTIVITAMIN-PRENATAL) 27-0.8 MG TABS tablet Take 1 tablet by mouth daily at  12 noon.    . sertraline (ZOLOFT) 25 MG tablet Take 25 mg by mouth at bedtime.     No current facility-administered medications on file prior to visit.     PAST MEDICAL HISTORY: Past Medical History:  Diagnosis Date  . Allergy   . AMA (advanced maternal age) multigravida 37+   . Anxiety   . Back pain   . Depression   . Dyspnea   . Family history of blood clots 05/22/2013  . Fatigue   . Goiter, unspecified   . History of chicken pox   . Hx of varicella   . Joint pain    knees and ankles ache  . OSA on CPAP   . Other ankle sprain and strain    achilles rupture  . Poor posture   . Poor sleep   . Ruptured lumbar disc   . Snoring   . 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 ACHILLES TENDON REPAIR     Left softball injury Dr. [name]  . BACK SURGERY     hirsch  . SKIN GRAFT      SOCIAL HISTORY: Social History   Tobacco Use  . Smoking status: Never Smoker  . Smokeless tobacco: Never Used  Substance Use Topics  . Alcohol use: Yes    Comment: occasionally  . Drug use: No    FAMILY HISTORY: Family History  Problem Relation Age of Onset  . Cancer Mother        rare blood cancer  . Stroke Mother   . Heart failure Mother   . Hypertension Father   . Nephrolithiasis Father   . Diabetes Father   . COPD Father   . Cancer Father        kidney  . Hyperlipidemia Father   . Sudden death Father   . Sleep apnea Father   . Alcoholism Father   . Obesity Father   . Thyroid disease Maternal Aunt   . Arthritis Maternal Grandmother        rheumatoid  . Heart disease Maternal Grandfather   . Cancer Maternal Grandfather        stomach  . Cancer Paternal Grandfather        stomach ca    ROS: Review of Systems  Constitutional: Positive for weight loss.  Gastrointestinal: Negative for nausea and vomiting.  Musculoskeletal:       Negative for muscle weakness.  Endo/Heme/Allergies:         Negative for polyphagia.    PHYSICAL EXAM: Blood pressure 125/83, pulse 65, temperature 97.8 F (36.6 C), temperature source Oral, height 5\' 9"  (1.753 m), weight 240 lb (108.9 kg), SpO2 99 %. Body mass index is 35.44 kg/m. Physical Exam Vitals signs reviewed.  Constitutional:      Appearance: Normal appearance. She is obese.  Cardiovascular:     Rate and Rhythm: Normal rate.  Pulmonary:     Effort: Pulmonary effort is normal.  Musculoskeletal: Normal range of motion.  Skin:    General: Skin is warm and dry.  Neurological:     Mental Status: She is alert and oriented to person, place, and time.  Psychiatric:        Mood and Affect: Mood normal.        Behavior: Behavior normal.     RECENT LABS AND TESTS: BMET    Component Value Date/Time   NA 139 05/02/2018 1108   K 4.2 05/02/2018 1108   CL 99 05/02/2018 1108   CO2 24 05/02/2018 1108   GLUCOSE 108 (H) 05/02/2018 1108   GLUCOSE 106 (H) 10/21/2016 0805   BUN 11 05/02/2018 1108   CREATININE 0.81 05/02/2018 1108   CALCIUM 9.7 05/02/2018 1108   GFRNONAA 87 05/02/2018 1108   GFRAA 100 05/02/2018 1108   Lab Results  Component Value Date   HGBA1C 5.5 05/02/2018   HGBA1C 6.1 10/21/2016   HGBA1C 5.4 01/08/2016   Lab Results  Component Value Date   INSULIN 11.5 05/02/2018   CBC    Component Value Date/Time   WBC 6.9 05/02/2018 1108   WBC 6.4 10/21/2016 0805   RBC 4.89 05/02/2018 1108   RBC 4.72 10/21/2016 0805   HGB 14.4 05/02/2018 1108   HCT 43.7 05/02/2018 1108   PLT 227.0 10/21/2016 0805   MCV 89 05/02/2018 1108   MCH 29.4 05/02/2018 1108   MCH 29.3 06/28/2011 0520   MCHC 33.0 05/02/2018 1108   MCHC 32.3 10/21/2016 0805   RDW 12.9 05/02/2018 1108   LYMPHSABS 2.3 05/02/2018 1108   MONOABS 0.6 10/21/2016 0805   EOSABS 0.1 05/02/2018 1108   BASOSABS 0.0 05/02/2018 1108   Iron/TIBC/Ferritin/ %Sat    Component Value Date/Time   FERRITIN 5.3 (L) 10/21/2016 0805   Lipid Panel     Component Value  Date/Time   CHOL 188 05/02/2018 1108   TRIG 108 05/02/2018 1108   HDL 56 05/02/2018 1108   CHOLHDL 3 10/21/2016 0805   VLDL 14.0 10/21/2016 0805   LDLCALC 110 (H) 05/02/2018 1108   Hepatic Function Panel     Component Value Date/Time   PROT 6.9 05/02/2018 1108   ALBUMIN 4.5 05/02/2018 1108   AST 18 05/02/2018 1108   ALT 21 05/02/2018 1108   ALKPHOS 88 05/02/2018 1108   BILITOT 0.5 05/02/2018 1108   BILIDIR 0.1 10/21/2016 0805      Component Value Date/Time   TSH 1.380 05/02/2018 1108   TSH 1.38 10/21/2016 0805   TSH 1.51 01/08/2016   TSH 1.58 12/30/2014 0804   Results for PADME, ARRIAGA (MRN [id number]) as of 07/31/2018 16:31  Ref. Range 05/02/2018 11:08  Vitamin D, 25-Hydroxy Latest Ref Range: 30.0 - 100.0 ng/mL 29.5 (L)    OBESITY BEHAVIORAL INTERVENTION VISIT  Today's visit was # 6   Starting weight: 260 lbs Starting date: 05/02/18 Today's weight : Weight: 240 lb (108.9 kg)  Today's date: 07/31/2018 Total lbs lost to date: 20  ASK: We discussed the diagnosis of obesity with [name] today and Alliana agreed to give [place] permission to discuss obesity behavioral modification therapy today.  ASSESS: Linzee has the diagnosis of obesity and her BMI today is 35.4. Neville is in the action stage of change.   ADVISE: Bertina was educated on the multiple health risks of obesity as well as the benefit of weight loss to improve her health. She was advised of the need for long term treatment and the importance of lifestyle modifications to improve her current health and to decrease her risk of future health problems.  AGREE: Multiple dietary modification  options and treatment options were discussed and Adelynne agreed to follow the recommendations documented in the above note.  ARRANGE: Daniella was educated on the importance of frequent visits to treat obesity as outlined per CMS and USPSTF guidelines and agreed to schedule her next follow up appointment  today.  I, Marcille Blanco, CMA, am acting as Location manager for [employer], FNP-C.  I have reviewed the above documentation for accuracy and completeness, and I agree with the above.  - Isreal Moline, FNP-C.

## 2018-08-01 ENCOUNTER — Ambulatory Visit (INDEPENDENT_AMBULATORY_CARE_PROVIDER_SITE_OTHER): Payer: 59 | Admitting: Family Medicine

## 2018-08-01 ENCOUNTER — Encounter (INDEPENDENT_AMBULATORY_CARE_PROVIDER_SITE_OTHER): Payer: Self-pay | Admitting: Family Medicine

## 2018-08-16 ENCOUNTER — Ambulatory Visit (INDEPENDENT_AMBULATORY_CARE_PROVIDER_SITE_OTHER): Payer: 59 | Admitting: Family Medicine

## 2018-08-16 VITALS — BP 115/70 | HR 75 | Temp 97.6°F | Ht 69.0 in | Wt 236.0 lb

## 2018-08-16 DIAGNOSIS — E7849 Other hyperlipidemia: Secondary | ICD-10-CM

## 2018-08-16 DIAGNOSIS — E669 Obesity, unspecified: Secondary | ICD-10-CM

## 2018-08-16 DIAGNOSIS — E8881 Metabolic syndrome: Secondary | ICD-10-CM | POA: Diagnosis not present

## 2018-08-16 DIAGNOSIS — Z9189 Other specified personal risk factors, not elsewhere classified: Secondary | ICD-10-CM | POA: Diagnosis not present

## 2018-08-16 DIAGNOSIS — E559 Vitamin D deficiency, unspecified: Secondary | ICD-10-CM | POA: Diagnosis not present

## 2018-08-16 DIAGNOSIS — Z6834 Body mass index (BMI) 34.0-34.9, adult: Secondary | ICD-10-CM

## 2018-08-16 MED ORDER — VITAMIN D (ERGOCALCIFEROL) 1.25 MG (50000 UNIT) PO CAPS: 50000.0000 [IU] | ORAL_CAPSULE | ORAL | 0 refills | Status: DC

## 2018-08-16 NOTE — Progress Notes (Signed)
Office: [phone]  /  Fax: [phone]   HPI:   Chief Complaint: OBESITY Sharon Myers is here to discuss her progress with her obesity treatment plan. She is on the Category 3 plan or keeping a food journal and is following her eating plan approximately 75% of the time. She states she is walking 45 minutes 7-8 times per week. Sharon Myers continues to do well with journaling. She states she has had some indulgences but mostly stays on the plan. Her weight is 236 lb (107 kg) today and has had a weight loss of 4 pounds over a period of 2 weeks since her last visit. She has lost 24 lbs since starting treatment with [place].  Vitamin D deficiency Sharon Myers has a diagnosis of Vitamin D deficiency and is currently not at goal. Her last Vitamin D level was reported to be low at 29.5 on 05/02/2018. She is currently taking prescription Vit D and denies nausea, vomiting or muscle weakness.  Insulin Resistance Sharon Myers has a diagnosis of insulin resistance based on her elevated fasting insulin level >5. Although Sharon Myers's blood glucose readings are still under good control, insulin resistance puts her at greater risk of metabolic syndrome and diabetes. She is not taking metformin currently and continues to work on diet and exercise to decrease risk of diabetes. She denies polyphagia. Lab Results  Component Value Date   HGBA1C 5.6 08/16/2018    Hyperlipidemia Sharon Myers has hyperlipidemia and is not on a stain. Her last LDL level was elevated at 110 on 05/02/2018. She has been trying to improve her cholesterol levels with intensive lifestyle modification including a low saturated fat diet, exercise and weight loss. She denies any chest pain or shortness of breath. The 10-year ASCVD risk score Sharon Myers DC Sharon Myers., et al., 2013) is: 0.6%   Values used to calculate the score:     Age: 51 years     Sex: Female     Is Non-Hispanic African American: No     Diabetic: No     Tobacco smoker: No     Systolic Blood  Pressure: 115 mmHg     Is BP treated: No     HDL Cholesterol: 49 mg/dL     Total Cholesterol: 142 mg/dL   At risk for cardiovascular disease Sharon Myers is at a higher than average risk for cardiovascular disease due to obesity. She currently denies any chest pain.  ASSESSMENT AND PLAN:  Vitamin D deficiency - Plan: VITAMIN D 25 Hydroxy (Vit-D Deficiency, Fractures), Vitamin D, Ergocalciferol, (DRISDOL) 1.25 MG (50000 UT) CAPS capsule  Insulin resistance - Plan: Comprehensive metabolic panel, Hemoglobin A1c, Insulin, random  Other hyperlipidemia - Plan: Lipid Panel With LDL/HDL Ratio  At risk for heart disease  Class 1 obesity with serious comorbidity and body mass index (BMI) of 34.0 to 34.9 in adult, unspecified obesity type  PLAN:  Vitamin D Deficiency Sharon Myers was informed that low Vitamin D levels contributes to fatigue and are associated with obesity, breast, and colon cancer. She agrees to continue to take prescription Vit D @ 50,000 IU every week #4 with 0 refills and will have routine testing of Vitamin D today. She was informed of the risk of over-replacement of Vitamin D and agrees to not increase her dose unless she discusses this with [place] first. Porter agrees to follow-up with our clinic in 3 weeks.  Insulin Resistance Sharon Myers will continue to work on weight loss, exercise, and decreasing simple carbohydrates in her diet to help decrease the risk of diabetes.  Sharon Myers is not on metformin and prescription was not written today. Sharon Myers will have fasting glucose, A1c, and fasting insulin drawn today. She agrees to follow-up with [place] as directed to monitor her progress.  Hyperlipidemia Sharon Myers was informed of the American Heart Association Guidelines emphasizing intensive lifestyle modifications as the first line treatment for hyperlipidemia. We discussed many lifestyle modifications today in depth, and Sharon Myers will continue to work on decreasing saturated fats such as fatty  red meat, butter and many fried foods. She will also increase vegetables and lean protein in her diet and continue to work on exercise and weight loss efforts. Sharon Myers will have [place] drawn today.  Cardiovascular risk counseling Sharon Myers was given extended (15 minutes) coronary artery disease prevention counseling today. She is 48 y.o. female and has risk factors for heart disease including obesity. We discussed intensive lifestyle modifications today with an emphasis on specific weight loss instructions and strategies. Pt was also informed of the importance of increasing exercise and decreasing saturated fats to help prevent heart disease.  Obesity Sharon Myers is currently in the action stage of change. As such, her goal is to continue with weight loss efforts. She has agreed to follow the Category 3 plan or journaling 1600 calories and 90-100 grams of protein. Sharon Myers has been instructed to continue her exercise regimen as noted above. We discussed the following Behavioral Modification Strategies today: increasing lean protein intake, decreasing simple carbohydrates, planning for success, and keep a strict food journal.   Sharon Myers has agreed to follow-up with our clinic in 3 weeks. She was informed of the importance of frequent follow up visits to maximize her success with intensive lifestyle modifications for her multiple health conditions.  ALLERGIES: No Known Allergies  MEDICATIONS: Current Outpatient Medications on File Prior to Visit  Medication Sig Dispense Refill  . buPROPion (WELLBUTRIN XL) 150 MG 24 hr tablet Take 150 mg by mouth daily.    . Prenatal Vit-Fe Fumarate-FA (MULTIVITAMIN-PRENATAL) 27-0.8 MG TABS tablet Take 1 tablet by mouth daily at 12 noon.    . sertraline (ZOLOFT) 25 MG tablet Take 25 mg by mouth at bedtime.     No current facility-administered medications on file prior to visit.     PAST MEDICAL HISTORY: Past Medical History:  Diagnosis Date  . Allergy   . AMA  (advanced maternal age) multigravida 63+   . Anxiety   . Back pain   . Depression   . Dyspnea   . Family history of blood clots 05/22/2013  . Fatigue   . Goiter, unspecified   . History of chicken pox   . Hx of varicella   . Joint pain    knees and ankles ache  . OSA on CPAP   . Other ankle sprain and strain    achilles rupture  . Poor posture   . Poor sleep   . Ruptured lumbar disc   . Snoring   . 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 ACHILLES TENDON REPAIR     Left softball injury Dr. [name]  . BACK SURGERY     hirsch  . SKIN GRAFT      SOCIAL HISTORY: Social History   Tobacco Use  . Smoking status: Never Smoker  . Smokeless tobacco: Never Used  Substance Use Topics  . Alcohol use: Yes    Comment: occasionally  . Drug use: No    FAMILY HISTORY: Family History  Problem Relation Age of Onset  . Cancer Mother        rare blood cancer  . Stroke Mother   . Heart failure Mother   . Hypertension Father   . Nephrolithiasis Father   . Diabetes Father   . COPD Father   . Cancer Father        kidney  . Hyperlipidemia Father   . Sudden death Father   . Sleep apnea Father   . Alcoholism Father   . Obesity Father   . Thyroid disease Maternal Aunt   . Arthritis Maternal Grandmother        rheumatoid  . Heart disease Maternal Grandfather   . Cancer Maternal Grandfather        stomach  . Cancer Paternal Grandfather        stomach ca   ROS: Review of Systems  Constitutional: Positive for weight loss.  Gastrointestinal: Negative for nausea and vomiting.  Musculoskeletal:       Negative for muscle weakness.  Endo/Heme/Allergies:       Negative for polyphagia. Negative for hypoglycemia.   PHYSICAL EXAM: Blood pressure 115/70, pulse 75, temperature 97.6 F (36.4 C), temperature source Oral, height 5\' 9"  (1.753 m), weight 236 lb (107 kg), last menstrual period  08/08/2018, SpO2 97 %. Body mass index is 34.85 kg/m. Physical Exam Vitals signs reviewed.  Constitutional:      Appearance: Normal appearance. She is obese.  Cardiovascular:     Rate and Rhythm: Normal rate.     Pulses: Normal pulses.  Pulmonary:     Effort: Pulmonary effort is normal.     Breath sounds: Normal breath sounds.  Musculoskeletal: Normal range of motion.  Skin:    General: Skin is warm and dry.  Neurological:     Mental Status: She is alert and oriented to person, place, and time.  Psychiatric:        Behavior: Behavior normal.   RECENT LABS AND TESTS: BMET    Component Value Date/Time   NA 139 05/02/2018 1108   K 4.2 05/02/2018 1108   CL 99 05/02/2018 1108   CO2 24 05/02/2018 1108   GLUCOSE 108 (H) 05/02/2018 1108   GLUCOSE 106 (H) 10/21/2016 0805   BUN 11 05/02/2018 1108   CREATININE 0.81 05/02/2018 1108   CALCIUM 9.7 05/02/2018 1108   GFRNONAA 87 05/02/2018 1108   GFRAA 100 05/02/2018 1108   Lab Results  Component Value Date   HGBA1C 5.5 05/02/2018   HGBA1C 6.1 10/21/2016   HGBA1C 5.4 01/08/2016   Lab Results  Component Value Date   INSULIN 11.5 05/02/2018   CBC    Component Value Date/Time   WBC 6.9 05/02/2018 1108   WBC 6.4 10/21/2016 0805   RBC 4.89 05/02/2018 1108   RBC 4.72 10/21/2016 0805   HGB 14.4 05/02/2018 1108   HCT 43.7 05/02/2018 1108   PLT 227.0 10/21/2016 0805   MCV 89 05/02/2018 1108   MCH 29.4 05/02/2018 1108   MCH 29.3 06/28/2011 0520   MCHC 33.0 05/02/2018 1108   MCHC 32.3 10/21/2016 0805   RDW 12.9 05/02/2018 1108   LYMPHSABS 2.3 05/02/2018 1108   MONOABS 0.6 10/21/2016 0805   EOSABS 0.1 05/02/2018 1108   BASOSABS 0.0 05/02/2018 1108   Iron/TIBC/Ferritin/ %Sat    Component Value Date/Time   FERRITIN 5.3 (L) 10/21/2016 0805   Lipid Panel     Component Value Date/Time   CHOL 188 05/02/2018 1108   TRIG 108 05/02/2018 1108  HDL 56 05/02/2018 1108   CHOLHDL 3 10/21/2016 0805   VLDL 14.0 10/21/2016 0805    LDLCALC 110 (H) 05/02/2018 1108   Hepatic Function Panel     Component Value Date/Time   PROT 6.9 05/02/2018 1108   ALBUMIN 4.5 05/02/2018 1108   AST 18 05/02/2018 1108   ALT 21 05/02/2018 1108   ALKPHOS 88 05/02/2018 1108   BILITOT 0.5 05/02/2018 1108   BILIDIR 0.1 10/21/2016 0805      Component Value Date/Time   TSH 1.380 05/02/2018 1108   TSH 1.38 10/21/2016 0805   TSH 1.51 01/08/2016   TSH 1.58 12/30/2014 0804    Ref. Range 05/02/2018 11:08  Vitamin D, 25-Hydroxy Latest Ref Range: 30.0 - 100.0 ng/mL 29.5 (L)   OBESITY BEHAVIORAL INTERVENTION VISIT  Today's visit was #7  Starting weight: 260 lbs Starting date: 05/02/2018 Today's weight: 236 lbs Today's date: 08/16/2018 Total lbs lost to date: 24    08/16/2018  Height 5\' 9"  (1.753 m)  Weight 236 lb (107 kg)  BMI (Calculated) 34.84  BLOOD PRESSURE - SYSTOLIC 833  BLOOD PRESSURE - DIASTOLIC 70   Body Fat % 82.5 %  Total Body Water (lbs) 92.2 lbs   ASK: We discussed the diagnosis of obesity with [name] today and Amand agreed to give [place] permission to discuss obesity behavioral modification therapy today.  ASSESS: Makinsley has the diagnosis of obesity and her BMI today is 34.84. Bianca is in the action stage of change.   ADVISE: Teofila was educated on the multiple health risks of obesity as well as the benefit of weight loss to improve her health. She was advised of the need for long term treatment and the importance of lifestyle modifications to improve her current health and to decrease her risk of future health problems.  AGREE: Multiple dietary modification options and treatment options were discussed and  Rafaella agreed to follow the recommendations documented in the above note.  ARRANGE: Mariette was educated on the importance of frequent visits to treat obesity as outlined per CMS and USPSTF guidelines and agreed to schedule her next follow up appointment today.  IMichaelene Song, am acting as  Location manager for [employer], FNP-C.  I have reviewed the above documentation for accuracy and completeness, and I agree with the above.  - Lachele Lievanos, FNP-C.

## 2018-08-17 ENCOUNTER — Encounter (INDEPENDENT_AMBULATORY_CARE_PROVIDER_SITE_OTHER): Payer: Self-pay | Admitting: Family Medicine

## 2018-08-17 LAB — COMPREHENSIVE METABOLIC PANEL
ALT: 14 IU/L (ref 0–32)
AST: 13 IU/L (ref 0–40)
Albumin/Globulin Ratio: 1.9 (ref 1.2–2.2)
Albumin: 4.4 g/dL (ref 3.8–4.8)
Alkaline Phosphatase: 75 IU/L (ref 39–117)
BUN/Creatinine Ratio: 15 (ref 9–23)
BUN: 13 mg/dL (ref 6–24)
Bilirubin Total: 0.4 mg/dL (ref 0.0–1.2)
CO2: 22 mmol/L (ref 20–29)
Calcium: 9.2 mg/dL (ref 8.7–10.2)
Chloride: 102 mmol/L (ref 96–106)
Creatinine, Ser: 0.85 mg/dL (ref 0.57–1.00)
GFR calc Af Amer: 94 mL/min/{1.73_m2} (ref >59)
GFR calc non Af Amer: 81 mL/min/{1.73_m2} (ref >59)
Globulin, Total: 2.3 g/dL (ref 1.5–4.5)
Glucose: 97 mg/dL (ref 65–99)
Potassium: 4.1 mmol/L (ref 3.5–5.2)
Sodium: 139 mmol/L (ref 134–144)
Total Protein: 6.7 g/dL (ref 6.0–8.5)

## 2018-08-17 LAB — HEMOGLOBIN A1C
Est. average glucose Bld gHb Est-mCnc: 114 mg/dL
Hgb A1c MFr Bld: 5.6 % (ref 4.8–5.6)

## 2018-08-17 LAB — LIPID PANEL WITH LDL/HDL RATIO
Cholesterol, Total: 142 mg/dL (ref 100–199)
HDL: 49 mg/dL (ref >39)
LDL Calculated: 83 mg/dL (ref 0–99)
LDl/HDL Ratio: 1.7 ratio (ref 0.0–3.2)
Triglycerides: 52 mg/dL (ref 0–149)
VLDL Cholesterol Cal: 10 mg/dL (ref 5–40)

## 2018-08-17 LAB — INSULIN, RANDOM: INSULIN: 6.5 u[IU]/mL (ref 2.6–24.9)

## 2018-08-17 LAB — VITAMIN D 25 HYDROXY (VIT D DEFICIENCY, FRACTURES): Vit D, 25-Hydroxy: 59.3 ng/mL (ref 30.0–100.0)

## 2018-08-23 DIAGNOSIS — G4733 Obstructive sleep apnea (adult) (pediatric): Secondary | ICD-10-CM | POA: Diagnosis not present

## 2018-09-04 ENCOUNTER — Encounter (INDEPENDENT_AMBULATORY_CARE_PROVIDER_SITE_OTHER): Payer: Self-pay | Admitting: Family Medicine

## 2018-09-05 ENCOUNTER — Encounter (INDEPENDENT_AMBULATORY_CARE_PROVIDER_SITE_OTHER): Payer: Self-pay

## 2018-09-06 ENCOUNTER — Encounter (INDEPENDENT_AMBULATORY_CARE_PROVIDER_SITE_OTHER): Payer: Self-pay

## 2018-09-06 ENCOUNTER — Ambulatory Visit (INDEPENDENT_AMBULATORY_CARE_PROVIDER_SITE_OTHER): Payer: 59 | Admitting: Family Medicine

## 2018-09-07 ENCOUNTER — Encounter (INDEPENDENT_AMBULATORY_CARE_PROVIDER_SITE_OTHER): Payer: Self-pay

## 2018-09-11 ENCOUNTER — Encounter (INDEPENDENT_AMBULATORY_CARE_PROVIDER_SITE_OTHER): Payer: Self-pay | Admitting: Family Medicine

## 2018-09-11 NOTE — Telephone Encounter (Signed)
Please address

## 2018-09-13 NOTE — Telephone Encounter (Signed)
Re-Address Please.

## 2018-09-14 ENCOUNTER — Other Ambulatory Visit: Payer: Self-pay

## 2018-09-14 ENCOUNTER — Encounter (INDEPENDENT_AMBULATORY_CARE_PROVIDER_SITE_OTHER): Payer: Self-pay | Admitting: Family Medicine

## 2018-09-14 ENCOUNTER — Ambulatory Visit (INDEPENDENT_AMBULATORY_CARE_PROVIDER_SITE_OTHER): Payer: 59 | Admitting: Family Medicine

## 2018-09-14 DIAGNOSIS — Z6834 Body mass index (BMI) 34.0-34.9, adult: Secondary | ICD-10-CM | POA: Diagnosis not present

## 2018-09-14 DIAGNOSIS — E8881 Metabolic syndrome: Secondary | ICD-10-CM | POA: Diagnosis not present

## 2018-09-14 DIAGNOSIS — E669 Obesity, unspecified: Secondary | ICD-10-CM

## 2018-09-14 DIAGNOSIS — E559 Vitamin D deficiency, unspecified: Secondary | ICD-10-CM | POA: Diagnosis not present

## 2018-09-18 ENCOUNTER — Encounter (INDEPENDENT_AMBULATORY_CARE_PROVIDER_SITE_OTHER): Payer: Self-pay | Admitting: Family Medicine

## 2018-09-18 NOTE — Progress Notes (Signed)
Office: [phone]  /  Fax: [phone] TeleHealth Visit:  Sharon Myers has verbally consented to this TeleHealth visit today. The patient is located at home, the provider is located at the [facility] office. The participants in this visit include the listed provider and patient. The visit was conducted today via FaceTime.  HPI:   Chief Complaint: OBESITY Sharon Myers is here to discuss her progress with her obesity treatment plan. She is on the Category 3 plan or journaling 1600 calories + 90 grams of protein and is following her eating plan approximately 50% of the time. She states she is walking 60 minutes 5 times per week. Sharon Myers states she weighed at home today and was 236.4 lbs and states she has lost weight. She does report increased stress eating but has overall done fairly well since the [place] pandemic started. We were unable to weigh the patient today for this TeleHealth visit. She feels as if she has lost weight since her last visit. She has lost 24 lbs since starting treatment with [place].  Vitamin D deficiency Sharon Myers has a diagnosis of Vitamin D deficiency, which is currently at goal. Her last Vitamin D level was reported to be 59.3 on 08/16/2018. She is currently taking prescription Vit D and denies nausea, vomiting or muscle weakness.  Insulin Resistance Sharon Myers has a diagnosis of insulin resistance based on her elevated fasting insulin level >5. Although Sharon Myers's blood glucose readings are still under good control, insulin resistance puts her at greater risk of metabolic syndrome and diabetes. She is not taking metformin currently and continues to work on diet and exercise to decrease risk of diabetes. She denies polyphagia. Lab Results  Component Value Date   HGBA1C 5.6 08/16/2018    ASSESSMENT AND PLAN:  Vitamin D deficiency  Insulin resistance  Class 1 obesity with serious comorbidity and body mass index (BMI) of 34.0 to 34.9 in adult, unspecified  obesity type  PLAN:  Vitamin D Deficiency Sharon Myers was informed that low Vitamin D levels contributes to fatigue and are associated with obesity, breast, and colon cancer. She agrees to discontinue taking prescription Vit D and will start OTC Vitamin D3 2,000 IU daily. She will follow-up for routine testing of Vitamin D, at least 2-3 times per year. She was informed of the risk of over-replacement of Vitamin D and agrees to not increase her dose unless she discusses this with [place] first. Sharon Myers agrees to follow-up with our clinic in 2 weeks.  Insulin Resistance Sharon Myers will continue to work on weight loss, exercise, and decreasing simple carbohydrates in her diet to help decrease the risk of diabetes. ates or too many calories at one sitting increases the likelihood of GI side effects. Sharon Myers is not on metformin and prescription was not written today. Sharon Myers agreed to follow-up with [place] as directed to monitor her progress.  Obesity Sharon Myers is currently in the action stage of change. As such, her goal is to continue with weight loss efforts. She has agreed to follow the Category 3 plan or journaling 1400-1600 calories + 90 grams of protein. Sharon Myers has been instructed to continue walking 60 minutes 5 times per week. She will also add 2 days of resistance training. We discussed the following Behavioral Modification Strategies today: planning for success.  Sharon Myers has agreed to follow-up with our clinic in 2 weeks. She was informed of the importance of frequent follow-up visits to maximize her success with intensive lifestyle modifications for her multiple health conditions.  ALLERGIES: No  Known Allergies  MEDICATIONS: Current Outpatient Medications on File Prior to Visit  Medication Sig Dispense Refill  . buPROPion (WELLBUTRIN XL) 150 MG 24 hr tablet Take 150 mg by mouth daily.    . Prenatal Vit-Fe Fumarate-FA (MULTIVITAMIN-PRENATAL) 27-0.8 MG TABS tablet Take 1 tablet by mouth daily  at 12 noon.    . sertraline (ZOLOFT) 25 MG tablet Take 25 mg by mouth at bedtime.    . Vitamin D, Ergocalciferol, (DRISDOL) 1.25 MG (50000 UT) CAPS capsule Take 1 capsule (50,000 Units total) by mouth every 7 (seven) days. 4 capsule 0   No current facility-administered medications on file prior to visit.     PAST MEDICAL HISTORY: Past Medical History:  Diagnosis Date  . Allergy   . AMA (advanced maternal age) multigravida 60+   . Anxiety   . Back pain   . Depression   . Dyspnea   . Family history of blood clots 05/22/2013  . Fatigue   . Goiter, unspecified   . History of chicken pox   . Hx of varicella   . Joint pain    knees and ankles ache  . OSA on CPAP   . Other ankle sprain and strain    achilles rupture  . Poor posture   . Poor sleep   . Ruptured lumbar disc   . Snoring   . 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 ACHILLES TENDON REPAIR     Left softball injury Dr. [name]  . BACK SURGERY     hirsch  . SKIN GRAFT      SOCIAL HISTORY: Social History   Tobacco Use  . Smoking status: Never Smoker  . Smokeless tobacco: Never Used  Substance Use Topics  . Alcohol use: Yes    Comment: occasionally  . Drug use: No    FAMILY HISTORY: Family History  Problem Relation Age of Onset  . Cancer Mother        rare blood cancer  . Stroke Mother   . Heart failure Mother   . Hypertension Father   . Nephrolithiasis Father   . Diabetes Father   . COPD Father   . Cancer Father        kidney  . Hyperlipidemia Father   . Sudden death Father   . Sleep apnea Father   . Alcoholism Father   . Obesity Father   . Thyroid disease Maternal Aunt   . Arthritis Maternal Grandmother        rheumatoid  . Heart disease Maternal Grandfather   . Cancer Maternal Grandfather        stomach  . Cancer Paternal Grandfather        stomach ca   ROS: Review of Systems  Gastrointestinal:  Negative for nausea and vomiting.  Musculoskeletal:       Negative for muscle weakness.  Endo/Heme/Allergies:       Negative for polyphagia.   PHYSICAL EXAM: Pt in no acute distress  RECENT LABS AND TESTS: BMET    Component Value Date/Time   NA 139 08/16/2018 1257   K 4.1 08/16/2018 1257   CL 102 08/16/2018 1257   CO2 22 08/16/2018 1257   GLUCOSE 97 08/16/2018 1257   GLUCOSE 106 (H) 10/21/2016 0805   BUN 13 08/16/2018 1257   CREATININE 0.85 08/16/2018 1257   CALCIUM 9.2 08/16/2018 1257   GFRNONAA 81 08/16/2018 1257   GFRAA  94 08/16/2018 1257   Lab Results  Component Value Date   HGBA1C 5.6 08/16/2018   HGBA1C 5.5 05/02/2018   HGBA1C 6.1 10/21/2016   HGBA1C 5.4 01/08/2016   Lab Results  Component Value Date   INSULIN 6.5 08/16/2018   INSULIN 11.5 05/02/2018   CBC    Component Value Date/Time   WBC 6.9 05/02/2018 1108   WBC 6.4 10/21/2016 0805   RBC 4.89 05/02/2018 1108   RBC 4.72 10/21/2016 0805   HGB 14.4 05/02/2018 1108   HCT 43.7 05/02/2018 1108   PLT 227.0 10/21/2016 0805   MCV 89 05/02/2018 1108   MCH 29.4 05/02/2018 1108   MCH 29.3 06/28/2011 0520   MCHC 33.0 05/02/2018 1108   MCHC 32.3 10/21/2016 0805   RDW 12.9 05/02/2018 1108   LYMPHSABS 2.3 05/02/2018 1108   MONOABS 0.6 10/21/2016 0805   EOSABS 0.1 05/02/2018 1108   BASOSABS 0.0 05/02/2018 1108   Iron/TIBC/Ferritin/ %Sat    Component Value Date/Time   FERRITIN 5.3 (L) 10/21/2016 0805   Lipid Panel     Component Value Date/Time   CHOL 142 08/16/2018 1257   TRIG 52 08/16/2018 1257   HDL 49 08/16/2018 1257   CHOLHDL 3 10/21/2016 0805   VLDL 14.0 10/21/2016 0805   LDLCALC 83 08/16/2018 1257   Hepatic Function Panel     Component Value Date/Time   PROT 6.7 08/16/2018 1257   ALBUMIN 4.4 08/16/2018 1257   AST 13 08/16/2018 1257   ALT 14 08/16/2018 1257   ALKPHOS 75 08/16/2018 1257   BILITOT 0.4 08/16/2018 1257   BILIDIR 0.1 10/21/2016 0805      Component Value Date/Time   TSH  1.380 05/02/2018 1108   TSH 1.38 10/21/2016 0805   TSH 1.51 01/08/2016   TSH 1.58 12/30/2014 0804   Results for DORIANA, MAZURKIEWICZ (MRN [id number]) as of 09/18/2018 07:11  Ref. Range 08/16/2018 12:57  Vitamin D, 25-Hydroxy Latest Ref Range: 30.0 - 100.0 ng/mL 59.3    I, Michaelene Song, am acting as Location manager for [employer], FNP-C.  I have reviewed the above documentation for accuracy and completeness, and I agree with the above.  - Tifanie Gardiner, FNP-C.

## 2018-09-23 ENCOUNTER — Encounter (INDEPENDENT_AMBULATORY_CARE_PROVIDER_SITE_OTHER): Payer: Self-pay | Admitting: Family Medicine

## 2018-09-23 DIAGNOSIS — G4733 Obstructive sleep apnea (adult) (pediatric): Secondary | ICD-10-CM | POA: Diagnosis not present

## 2018-09-25 NOTE — Telephone Encounter (Signed)
Please review

## 2018-09-28 ENCOUNTER — Ambulatory Visit (INDEPENDENT_AMBULATORY_CARE_PROVIDER_SITE_OTHER): Payer: 59 | Admitting: Family Medicine

## 2018-10-03 ENCOUNTER — Encounter (INDEPENDENT_AMBULATORY_CARE_PROVIDER_SITE_OTHER): Payer: Self-pay | Admitting: Bariatrics

## 2018-10-03 ENCOUNTER — Ambulatory Visit (INDEPENDENT_AMBULATORY_CARE_PROVIDER_SITE_OTHER): Payer: 59 | Admitting: Bariatrics

## 2018-10-03 ENCOUNTER — Other Ambulatory Visit: Payer: Self-pay

## 2018-10-03 DIAGNOSIS — E559 Vitamin D deficiency, unspecified: Secondary | ICD-10-CM | POA: Diagnosis not present

## 2018-10-03 DIAGNOSIS — Z6834 Body mass index (BMI) 34.0-34.9, adult: Secondary | ICD-10-CM

## 2018-10-03 DIAGNOSIS — E8881 Metabolic syndrome: Secondary | ICD-10-CM

## 2018-10-03 DIAGNOSIS — F3289 Other specified depressive episodes: Secondary | ICD-10-CM | POA: Diagnosis not present

## 2018-10-03 DIAGNOSIS — E669 Obesity, unspecified: Secondary | ICD-10-CM

## 2018-10-04 NOTE — Progress Notes (Signed)
Office: [phone]  /  Fax: [phone] TeleHealth Visit:  Sharon Myers has verbally consented to this TeleHealth visit today. The patient is located at home, the provider is located at the [facility] office. The participants in this visit include the listed provider and patient and any and all parties involved. The visit was conducted today via FaceTime.  HPI:   Chief Complaint: OBESITY Sharon Myers is here to discuss her progress with her obesity treatment plan. She is on the Category 3 plan and is following her eating plan approximately 60 to 70 % of the time. She states she is walking for 45 to 90 minutes 5 to 6 times per week. Sharon Myers thinks that she has stayed the same weight. Her calorie content is down. Sharon Myers is doing some stress eating. We were unable to weigh the patient today for this TeleHealth visit. She feels as if she has maintained weight since her last visit. She has lost 24 lbs since starting treatment with [place].  Vitamin D deficiency Sharon Myers has a diagnosis of vitamin D deficiency. Her last vitamin D level was at 58.3 She is currently taking vit D and denies nausea, vomiting or muscle weakness.  Insulin Resistance Sharon Myers has a diagnosis of insulin resistance based on her elevated fasting insulin level >5. Although Sharon Myers's blood glucose readings are still under good control, insulin resistance puts her at greater risk of metabolic syndrome and diabetes. Her last A1c was at 5.6 and last insulin level was at 6.5 She is not taking medications currently and continues to work on diet and exercise to decrease risk of diabetes.  Depression with emotional eating behaviors Sharon Myers is struggling with emotional eating and using food for comfort to the extent that it is negatively impacting her health. She often snacks when she is not hungry. Sharon Myers sometimes feels she is out of control and then feels guilty that she made poor food choices. Sharon Myers is  currently taking Wellbutrin. She has been working on behavior modification techniques to help reduce her emotional eating and has been somewhat successful. She shows no sign of suicidal or homicidal ideations.  Depression screen PHQ 2/9 05/02/2018  Decreased Interest 3  Down, Depressed, Hopeless 2  PHQ - 2 Score 5  Altered sleeping 3  Tired, decreased energy 3  Change in appetite 2  Feeling bad or failure about yourself  1  Trouble concentrating 1  Moving slowly or fidgety/restless 0  Suicidal thoughts 0  PHQ-9 Score 15  Difficult doing work/chores Somewhat difficult    ASSESSMENT AND PLAN:  Vitamin D deficiency  Insulin resistance  Other depression - with emotional eating  Class 1 obesity with serious comorbidity and body mass index (BMI) of 34.0 to 34.9 in adult, unspecified obesity type  PLAN:  Vitamin D Deficiency Sharon Myers was informed that low vitamin D levels contributes to fatigue and are associated with obesity, breast, and colon cancer. She agrees to discontinue prescription Vit D and begin OTC vitamin D @2 ,000 IU daily and will follow up for routine testing of vitamin D, at least 2-3 times per year. She was informed of the risk of over-replacement of vitamin D and agrees to not increase her dose unless she discusses this with [place] first. Sharon Myers agreed to follow up as directed.  Insulin Resistance Sharon Myers will continue to work on weight loss, exercise, increasing lean protein and decreasing simple carbohydrates in her diet to help decrease the risk of diabetes. She was informed that eating too many simple carbohydrates  or too many calories at one sitting increases the likelihood of GI side effects. Sharon Myers agreed to follow up with [place] as directed to monitor her progress.  Depression with Emotional Eating Behaviors We discussed behavior modification techniques today to help Sharon Myers deal with her emotional eating and depression. She will continue Wellbutrin SR 150 mg  daily and follow up as directed.  Obesity Sharon Myers is currently in the action stage of change. As such, her goal is to continue with weight loss efforts She has agreed to follow the Category 3 plan with tracker (Sharon Myers) Sharon Myers has been instructed to work up to a goal of 150 minutes of combined cardio and strengthening exercise per week for weight loss and overall health benefits. We discussed the following Behavioral Modification Strategies today: increase H2O intake, no skipping meals, keeping healthy foods in the home, increasing lean protein intake, decreasing simple carbohydrates, increasing vegetables, decrease eating out and work on meal planning and easy cooking plans Sharon Myers will weigh herself at home before each visit. She will work on making better choices.  Sharon Myers has agreed to follow up with our clinic in 2 weeks. She was informed of the importance of frequent follow up visits to maximize her success with intensive lifestyle modifications for her multiple health conditions.  ALLERGIES: No Known Allergies  MEDICATIONS: Current Outpatient Medications on File Prior to Visit  Medication Sig Dispense Refill   buPROPion (WELLBUTRIN XL) 150 MG 24 hr tablet Take 150 mg by mouth daily.     Prenatal Vit-Fe Fumarate-FA (MULTIVITAMIN-PRENATAL) 27-0.8 MG TABS tablet Take 1 tablet by mouth daily at 12 noon.     sertraline (ZOLOFT) 25 MG tablet Take 25 mg by mouth at bedtime.     No current facility-administered medications on file prior to visit.     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 [name]   BACK SURGERY     hirsch   SKIN GRAFT      SOCIAL HISTORY: Social History   Tobacco Use   Smoking status: Never Smoker   Smokeless tobacco: Never Used  Substance Use Topics   Alcohol use: Yes    Comment: occasionally   Drug use: No    FAMILY HISTORY: Family History  Problem Relation Age of Onset   Cancer Mother        rare blood cancer   Stroke Mother    Heart failure Mother    Hypertension Father    Nephrolithiasis Father    Diabetes Father    COPD Father    Cancer Father        kidney   Hyperlipidemia Father    Sudden  death Father    Sleep apnea Father    Alcoholism Father    Obesity Father    Thyroid disease Maternal Aunt    Arthritis Maternal Grandmother        rheumatoid   Heart disease Maternal Grandfather    Cancer Maternal Grandfather        stomach   Cancer Paternal Grandfather        stomach ca    ROS: Review of Systems  Constitutional: Negative for weight loss.  Gastrointestinal: Negative for nausea and vomiting.  Musculoskeletal:       Negative for muscle weakness  Psychiatric/Behavioral: Positive for depression. Negative for suicidal ideas.    PHYSICAL EXAM: Pt in no acute distress  RECENT LABS AND TESTS: BMET    Component Value Date/Time   NA 139 08/16/2018 1257   K 4.1 08/16/2018 1257   CL 102 08/16/2018 1257   CO2 22 08/16/2018 1257   GLUCOSE 97 08/16/2018 1257   GLUCOSE 106 (H) 10/21/2016 0805   BUN 13 08/16/2018 1257   CREATININE 0.85 08/16/2018 1257   CALCIUM 9.2 08/16/2018 1257   GFRNONAA 81 08/16/2018 1257   GFRAA 94 08/16/2018 1257   Lab Results  Component Value Date   HGBA1C 5.6 08/16/2018   HGBA1C 5.5 05/02/2018   HGBA1C 6.1 10/21/2016     HGBA1C 5.4 01/08/2016   Lab Results  Component Value Date   INSULIN 6.5 08/16/2018   INSULIN 11.5 05/02/2018   CBC    Component Value Date/Time   WBC 6.9 05/02/2018 1108   WBC 6.4 10/21/2016 0805   RBC 4.89 05/02/2018 1108   RBC 4.72 10/21/2016 0805   HGB 14.4 05/02/2018 1108   HCT 43.7 05/02/2018 1108   PLT 227.0 10/21/2016 0805   MCV 89 05/02/2018 1108   MCH 29.4 05/02/2018 1108   MCH 29.3 06/28/2011 0520   MCHC 33.0 05/02/2018 1108   MCHC 32.3 10/21/2016 0805   RDW 12.9 05/02/2018 1108   LYMPHSABS 2.3 05/02/2018 1108   MONOABS 0.6 10/21/2016 0805   EOSABS 0.1 05/02/2018 1108   BASOSABS 0.0 05/02/2018 1108   Iron/TIBC/Ferritin/ %Sat    Component Value Date/Time   FERRITIN 5.3 (L) 10/21/2016 0805   Lipid Panel     Component Value Date/Time   CHOL 142 08/16/2018 1257   TRIG 52 08/16/2018 1257   HDL 49 08/16/2018 1257   CHOLHDL 3 10/21/2016 0805   VLDL 14.0 10/21/2016 0805   LDLCALC 83 08/16/2018 1257   Hepatic Function Panel     Component Value Date/Time   PROT 6.7 08/16/2018 1257   ALBUMIN 4.4 08/16/2018 1257   AST 13 08/16/2018 1257   ALT 14 08/16/2018 1257   ALKPHOS 75 08/16/2018 1257   BILITOT 0.4 08/16/2018 1257   BILIDIR 0.1 10/21/2016 0805      Component Value Date/Time   TSH 1.380 05/02/2018 1108   TSH 1.38 10/21/2016 0805   TSH 1.51 01/08/2016   TSH 1.58 12/30/2014 0804     Ref. Range 08/16/2018 12:57  Vitamin D, 25-Hydroxy Latest Ref Range: 30.0 - 100.0 ng/mL 59.3    I, Doreene Nest, am acting as Location manager for [employer]. Owens Shark, DO  I have reviewed the above documentation for accuracy and completeness, and I agree with the above. -Jearld Lesch, DO

## 2018-10-23 ENCOUNTER — Encounter (INDEPENDENT_AMBULATORY_CARE_PROVIDER_SITE_OTHER): Payer: Self-pay | Admitting: Bariatrics

## 2018-10-23 ENCOUNTER — Ambulatory Visit (INDEPENDENT_AMBULATORY_CARE_PROVIDER_SITE_OTHER): Payer: 59 | Admitting: Bariatrics

## 2018-10-23 ENCOUNTER — Other Ambulatory Visit: Payer: Self-pay

## 2018-10-23 DIAGNOSIS — G4733 Obstructive sleep apnea (adult) (pediatric): Secondary | ICD-10-CM | POA: Diagnosis not present

## 2018-10-23 DIAGNOSIS — Z6834 Body mass index (BMI) 34.0-34.9, adult: Secondary | ICD-10-CM | POA: Diagnosis not present

## 2018-10-23 DIAGNOSIS — E669 Obesity, unspecified: Secondary | ICD-10-CM | POA: Diagnosis not present

## 2018-10-23 DIAGNOSIS — F3289 Other specified depressive episodes: Secondary | ICD-10-CM

## 2018-10-23 DIAGNOSIS — E8881 Metabolic syndrome: Secondary | ICD-10-CM | POA: Diagnosis not present

## 2018-10-24 NOTE — Progress Notes (Signed)
Office: [phone]  /  Fax: [phone] TeleHealth Visit:  Sharon Myers has verbally consented to this TeleHealth visit today. The patient is located at home, the provider is located at the [facility] office. The participants in this visit include the listed provider and patient and any and all parties involved. The visit was conducted today via FaceTime.  HPI:   Chief Complaint: OBESITY Sharon Myers is here to discuss her progress with her obesity treatment plan. She is on the Category 3 plan and is following her eating plan approximately 40 % of the time. She states she is walking for 45 minutes 5 times per week and 1 to 2 hours 2 times per week. Sharon Myers states that her weight has stayed the same (weight 229 lbs). She has had some stress eating. She does not always have food on hand. Donnie is drinking adequate water. We were unable to weigh the patient today for this TeleHealth visit. She feels as if she has maintained weight since her last visit. She has lost 31 lbs since starting treatment with [place].  Insulin Resistance Sharon Myers has a diagnosis of insulin resistance based on her elevated fasting insulin level >5. Although Sharon Myers's blood glucose readings are still under good control, insulin resistance puts her at greater risk of metabolic syndrome and diabetes. She is not taking medications currently and continues to work on diet and exercise to decrease risk of diabetes. Sharon Myers denies polyphagia.  Depression with emotional eating behaviors Sharon Myers is struggling with emotional eating and using food for comfort to the extent that it is negatively impacting her health. She often snacks when she is not hungry. Sharon Myers sometimes feels she is out of control and then feels guilty that she made poor food choices. She is taking Wellbutrin XL currently. She has been working on behavior modification techniques to help reduce her emotional eating and has been somewhat  successful. She shows no sign of suicidal or homicidal ideations.  Depression screen PHQ 2/9 05/02/2018  Decreased Interest 3  Down, Depressed, Hopeless 2  PHQ - 2 Score 5  Altered sleeping 3  Tired, decreased energy 3  Change in appetite 2  Feeling bad or failure about yourself  1  Trouble concentrating 1  Moving slowly or fidgety/restless 0  Suicidal thoughts 0  PHQ-9 Score 15  Difficult doing work/chores Somewhat difficult    ASSESSMENT AND PLAN:  Insulin resistance  Other depression - with emotional eating  Class 1 obesity with serious comorbidity and body mass index (BMI) of 34.0 to 34.9 in adult, unspecified obesity type  PLAN:  Insulin Resistance Sharon Myers will continue to work on weight loss, exercise, increasing lean protein and decreasing simple carbohydrates in her diet to help decrease the risk of diabetes. She was informed that eating too many simple carbohydrates or too many calories at one sitting increases the likelihood of GI side effects. Charli agreed to follow up with [place] as directed to monitor her progress.  Depression with Emotional Eating Behaviors We discussed behavior modification techniques today to help Sharon Myers deal with her emotional eating and depression. She will continue Wellbutrin XL 150 mg daily and follow up as directed.  Obesity Sharon Myers is currently in the action stage of change. As such, her goal is to continue with weight loss efforts She has agreed to follow the Category 3 plan Sharon Myers will continue to walk for weight loss and overall health benefits. We discussed the following Behavioral Modification Strategies today: increase H2O intake, no skipping meals,  keeping healthy foods in the home, better snacking choices, increasing lean protein intake (increase protein goals), decreasing simple carbohydrates, increasing vegetables, decrease eating out, work on meal planning and easy cooking plans and emotional eating strategies Sharon Myers will  weigh at home and record before each visit.  Sharon Myers has agreed to follow up with our clinic in 2 weeks. She was informed of the importance of frequent follow up visits to maximize her success with intensive lifestyle modifications for her multiple health conditions.  ALLERGIES: No Known Allergies  MEDICATIONS: Current Outpatient Medications on File Prior to Visit  Medication Sig Dispense Refill  . buPROPion (WELLBUTRIN XL) 150 MG 24 hr tablet Take 150 mg by mouth daily.    . Prenatal Vit-Fe Fumarate-FA (MULTIVITAMIN-PRENATAL) 27-0.8 MG TABS tablet Take 1 tablet by mouth daily at 12 noon.    . sertraline (ZOLOFT) 25 MG tablet Take 25 mg by mouth at bedtime.     No current facility-administered medications on file prior to visit.     PAST MEDICAL HISTORY: Past Medical History:  Diagnosis Date  . Allergy   . AMA (advanced maternal age) multigravida 67+   . Anxiety   . Back pain   . Depression   . Dyspnea   . Family history of blood clots 05/22/2013  . Fatigue   . Goiter, unspecified   . History of chicken pox   . Hx of varicella   . Joint pain    knees and ankles ache  . OSA on CPAP   . Other ankle sprain and strain    achilles rupture  . Poor posture   . Poor sleep   . Ruptured lumbar disc   . Snoring   . 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 ACHILLES TENDON REPAIR     Left softball injury Dr. [name]  . BACK SURGERY     hirsch  . SKIN GRAFT      SOCIAL HISTORY: Social History   Tobacco Use  . Smoking status: Never Smoker  . Smokeless tobacco: Never Used  Substance Use Topics  . Alcohol use: Yes    Comment: occasionally  . Drug use: No    FAMILY HISTORY: Family History  Problem Relation Age of Onset  . Cancer Mother        rare blood cancer  . Stroke Mother   . Heart failure Mother   . Hypertension Father   . Nephrolithiasis Father   . Diabetes Father   .  COPD Father   . Cancer Father        kidney  . Hyperlipidemia Father   . Sudden death Father   . Sleep apnea Father   . Alcoholism Father   . Obesity Father   . Thyroid disease Maternal Aunt   . Arthritis Maternal Grandmother        rheumatoid  . Heart disease Maternal Grandfather   . Cancer Maternal Grandfather        stomach  . Cancer Paternal Grandfather        stomach ca    ROS: Review of Systems  Constitutional: Negative for weight loss.  Endo/Heme/Allergies:       Negative for polyphagia  Psychiatric/Behavioral: Positive for depression. Negative for suicidal ideas.    PHYSICAL EXAM: Pt in no acute distress  RECENT LABS AND TESTS: BMET    Component Value Date/Time   NA 139 08/16/2018 1257  K 4.1 08/16/2018 1257   CL 102 08/16/2018 1257   CO2 22 08/16/2018 1257   GLUCOSE 97 08/16/2018 1257   GLUCOSE 106 (H) 10/21/2016 0805   BUN 13 08/16/2018 1257   CREATININE 0.85 08/16/2018 1257   CALCIUM 9.2 08/16/2018 1257   GFRNONAA 81 08/16/2018 1257   GFRAA 94 08/16/2018 1257   Lab Results  Component Value Date   HGBA1C 5.6 08/16/2018   HGBA1C 5.5 05/02/2018   HGBA1C 6.1 10/21/2016   HGBA1C 5.4 01/08/2016   Lab Results  Component Value Date   INSULIN 6.5 08/16/2018   INSULIN 11.5 05/02/2018   CBC    Component Value Date/Time   WBC 6.9 05/02/2018 1108   WBC 6.4 10/21/2016 0805   RBC 4.89 05/02/2018 1108   RBC 4.72 10/21/2016 0805   HGB 14.4 05/02/2018 1108   HCT 43.7 05/02/2018 1108   PLT 227.0 10/21/2016 0805   MCV 89 05/02/2018 1108   MCH 29.4 05/02/2018 1108   MCH 29.3 06/28/2011 0520   MCHC 33.0 05/02/2018 1108   MCHC 32.3 10/21/2016 0805   RDW 12.9 05/02/2018 1108   LYMPHSABS 2.3 05/02/2018 1108   MONOABS 0.6 10/21/2016 0805   EOSABS 0.1 05/02/2018 1108   BASOSABS 0.0 05/02/2018 1108   Iron/TIBC/Ferritin/ %Sat    Component Value Date/Time   FERRITIN 5.3 (L) 10/21/2016 0805   Lipid Panel     Component Value Date/Time   CHOL 142  08/16/2018 1257   TRIG 52 08/16/2018 1257   HDL 49 08/16/2018 1257   CHOLHDL 3 10/21/2016 0805   VLDL 14.0 10/21/2016 0805   LDLCALC 83 08/16/2018 1257   Hepatic Function Panel     Component Value Date/Time   PROT 6.7 08/16/2018 1257   ALBUMIN 4.4 08/16/2018 1257   AST 13 08/16/2018 1257   ALT 14 08/16/2018 1257   ALKPHOS 75 08/16/2018 1257   BILITOT 0.4 08/16/2018 1257   BILIDIR 0.1 10/21/2016 0805      Component Value Date/Time   TSH 1.380 05/02/2018 1108   TSH 1.38 10/21/2016 0805   TSH 1.51 01/08/2016   TSH 1.58 12/30/2014 0804     Ref. Range 08/16/2018 12:57  Vitamin D, 25-Hydroxy Latest Ref Range: 30.0 - 100.0 ng/mL 59.3    I, Doreene Nest, am acting as Location manager for [employer]. Owens Shark, DO  I have reviewed the above documentation for accuracy and completeness, and I agree with the above. -Jearld Lesch, DO

## 2018-11-07 ENCOUNTER — Other Ambulatory Visit: Payer: Self-pay

## 2018-11-07 ENCOUNTER — Ambulatory Visit (INDEPENDENT_AMBULATORY_CARE_PROVIDER_SITE_OTHER): Payer: 59 | Admitting: Bariatrics

## 2018-11-07 ENCOUNTER — Encounter (INDEPENDENT_AMBULATORY_CARE_PROVIDER_SITE_OTHER): Payer: Self-pay | Admitting: Bariatrics

## 2018-11-07 DIAGNOSIS — Z6834 Body mass index (BMI) 34.0-34.9, adult: Secondary | ICD-10-CM | POA: Diagnosis not present

## 2018-11-07 DIAGNOSIS — E8881 Metabolic syndrome: Secondary | ICD-10-CM | POA: Diagnosis not present

## 2018-11-07 DIAGNOSIS — E669 Obesity, unspecified: Secondary | ICD-10-CM

## 2018-11-07 DIAGNOSIS — F3289 Other specified depressive episodes: Secondary | ICD-10-CM

## 2018-11-08 NOTE — Progress Notes (Signed)
Office: [phone]  /  Fax: [phone] TeleHealth Visit:  Sharon Myers has verbally consented to this TeleHealth visit today. The patient is located at home, the provider is located at the [facility] office. The participants in this visit include the listed provider and patient and any and all parties involved. The visit was conducted today via FaceTime.  HPI:   Chief Complaint: OBESITY Sharon Myers is here to discuss her progress with her obesity treatment plan. She is on the Category 3 plan and is following her eating plan approximately 50 % of the time. She states she is walking 30 to 45 minutes 5 times per week and 2 to 3 hours 2 times per week. Sharon Myers states that her weight has remained the same (weight 227 lbs). She is doing well overall. We were unable to weigh the patient today for this TeleHealth visit. She feels as if she has maintained weight since her last visit. She has lost 33 lbs since starting treatment with [place].  Insulin Resistance Sharon Myers has a diagnosis of insulin resistance based on her elevated fasting insulin level >5. Although Sharon Myers's blood glucose readings are still under good control, insulin resistance puts her at greater risk of metabolic syndrome and diabetes. Her last A1c was at 5.6 and last insulin level was at 6.5 Sharon Myers is not taking medications currently and she continues to work on diet and exercise to decrease risk of diabetes.  Depression with emotional eating behaviors Sharon Myers is struggling with emotional eating and using food for comfort to the extent that it is negatively impacting her health. She often snacks when she is not hungry. Sharon Myers sometimes feels she is out of control and then feels guilty that she made poor food choices. Sharon Myers is taking Wellbutrin and Zoloft. She has been working on behavior modification techniques to help reduce her emotional eating and has been somewhat successful. She shows no sign of suicidal or  homicidal ideations.  Depression screen PHQ 2/9 05/02/2018  Decreased Interest 3  Down, Depressed, Hopeless 2  PHQ - 2 Score 5  Altered sleeping 3  Tired, decreased energy 3  Change in appetite 2  Feeling bad or failure about yourself  1  Trouble concentrating 1  Moving slowly or fidgety/restless 0  Suicidal thoughts 0  PHQ-9 Score 15  Difficult doing work/chores Somewhat difficult    ASSESSMENT AND PLAN:  Insulin resistance  Other depression - with emotional eating   Class 1 obesity with serious comorbidity and body mass index (BMI) of 34.0 to 34.9 in adult, unspecified obesity type  PLAN:  Insulin Resistance Sharon Myers will continue to work on weight loss, exercise, increasing lean protein and decreasing simple carbohydrates in her diet to help decrease the risk of diabetes. She was informed that eating too many simple carbohydrates or too many calories at one sitting increases the likelihood of GI side effects. Asheley agreed to follow up with [place] as directed to monitor her progress.  Depression with Emotional Eating Behaviors We discussed behavior modification techniques today to help Sharon Myers deal with her emotional eating and depression. She will continue her medications and follow up as directed. We discussed possibly increasing Wellbutrin (not at this time).  Obesity Sharon Myers is currently in the action stage of change. As such, her goal is to continue with weight loss efforts She has agreed to follow the Category 3 plan Sharon Myers will continue her exercise regimen for weight loss and overall health benefits. We discussed the following Behavioral Modification Strategies today:  increase H2O intake, no skipping meals, keeping healthy foods in the home, increasing lean protein intake, decreasing simple carbohydrates, increasing vegetables, decrease eating out and work on meal planning and easy cooking plans Sharon Myers will weigh herself at home before visits.  Sharon Myers has  agreed to follow up with our clinic in 2 weeks. She was informed of the importance of frequent follow up visits to maximize her success with intensive lifestyle modifications for her multiple health conditions.  ALLERGIES: No Known Allergies  MEDICATIONS: Current Outpatient Medications on File Prior to Visit  Medication Sig Dispense Refill  . buPROPion (WELLBUTRIN XL) 150 MG 24 hr tablet Take 150 mg by mouth daily.    . Prenatal Vit-Fe Fumarate-FA (MULTIVITAMIN-PRENATAL) 27-0.8 MG TABS tablet Take 1 tablet by mouth daily at 12 noon.    . sertraline (ZOLOFT) 25 MG tablet Take 25 mg by mouth at bedtime.     No current facility-administered medications on file prior to visit.     PAST MEDICAL HISTORY: Past Medical History:  Diagnosis Date  . Allergy   . AMA (advanced maternal age) multigravida 63+   . Anxiety   . Back pain   . Depression   . Dyspnea   . Family history of blood clots 05/22/2013  . Fatigue   . Goiter, unspecified   . History of chicken pox   . Hx of varicella   . Joint pain    knees and ankles ache  . OSA on CPAP   . Other ankle sprain and strain    achilles rupture  . Poor posture   . Poor sleep   . Ruptured lumbar disc   . Snoring   . 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 ACHILLES TENDON REPAIR     Left softball injury Dr. [name]  . BACK SURGERY     hirsch  . SKIN GRAFT      SOCIAL HISTORY: Social History   Tobacco Use  . Smoking status: Never Smoker  . Smokeless tobacco: Never Used  Substance Use Topics  . Alcohol use: Yes    Comment: occasionally  . Drug use: No    FAMILY HISTORY: Family History  Problem Relation Age of Onset  . Cancer Mother        rare blood cancer  . Stroke Mother   . Heart failure Mother   . Hypertension Father   . Nephrolithiasis Father   . Diabetes Father   . COPD Father   . Cancer Father        kidney  .  Hyperlipidemia Father   . Sudden death Father   . Sleep apnea Father   . Alcoholism Father   . Obesity Father   . Thyroid disease Maternal Aunt   . Arthritis Maternal Grandmother        rheumatoid  . Heart disease Maternal Grandfather   . Cancer Maternal Grandfather        stomach  . Cancer Paternal Grandfather        stomach ca    ROS: Review of Systems  Constitutional: Negative for weight loss.  Psychiatric/Behavioral: Positive for depression. Negative for suicidal ideas.    PHYSICAL EXAM: Pt in no acute distress  RECENT LABS AND TESTS: BMET    Component Value Date/Time   NA 139 08/16/2018 1257   K 4.1 08/16/2018 1257   CL 102 08/16/2018 1257   CO2 22 08/16/2018  1257   GLUCOSE 97 08/16/2018 1257   GLUCOSE 106 (H) 10/21/2016 0805   BUN 13 08/16/2018 1257   CREATININE 0.85 08/16/2018 1257   CALCIUM 9.2 08/16/2018 1257   GFRNONAA 81 08/16/2018 1257   GFRAA 94 08/16/2018 1257   Lab Results  Component Value Date   HGBA1C 5.6 08/16/2018   HGBA1C 5.5 05/02/2018   HGBA1C 6.1 10/21/2016   HGBA1C 5.4 01/08/2016   Lab Results  Component Value Date   INSULIN 6.5 08/16/2018   INSULIN 11.5 05/02/2018   CBC    Component Value Date/Time   WBC 6.9 05/02/2018 1108   WBC 6.4 10/21/2016 0805   RBC 4.89 05/02/2018 1108   RBC 4.72 10/21/2016 0805   HGB 14.4 05/02/2018 1108   HCT 43.7 05/02/2018 1108   PLT 227.0 10/21/2016 0805   MCV 89 05/02/2018 1108   MCH 29.4 05/02/2018 1108   MCH 29.3 06/28/2011 0520   MCHC 33.0 05/02/2018 1108   MCHC 32.3 10/21/2016 0805   RDW 12.9 05/02/2018 1108   LYMPHSABS 2.3 05/02/2018 1108   MONOABS 0.6 10/21/2016 0805   EOSABS 0.1 05/02/2018 1108   BASOSABS 0.0 05/02/2018 1108   Iron/TIBC/Ferritin/ %Sat    Component Value Date/Time   FERRITIN 5.3 (L) 10/21/2016 0805   Lipid Panel     Component Value Date/Time   CHOL 142 08/16/2018 1257   TRIG 52 08/16/2018 1257   HDL 49 08/16/2018 1257   CHOLHDL 3 10/21/2016 0805   VLDL  14.0 10/21/2016 0805   LDLCALC 83 08/16/2018 1257   Hepatic Function Panel     Component Value Date/Time   PROT 6.7 08/16/2018 1257   ALBUMIN 4.4 08/16/2018 1257   AST 13 08/16/2018 1257   ALT 14 08/16/2018 1257   ALKPHOS 75 08/16/2018 1257   BILITOT 0.4 08/16/2018 1257   BILIDIR 0.1 10/21/2016 0805      Component Value Date/Time   TSH 1.380 05/02/2018 1108   TSH 1.38 10/21/2016 0805   TSH 1.51 01/08/2016   TSH 1.58 12/30/2014 0804     Ref. Range 08/16/2018 12:57  Vitamin D, 25-Hydroxy Latest Ref Range: 30.0 - 100.0 ng/mL 59.3    I, Doreene Nest, am acting as Location manager for [employer]. Owens Shark, DO  I have reviewed the above documentation for accuracy and completeness, and I agree with the above. -Jearld Lesch, DO

## 2018-11-23 ENCOUNTER — Other Ambulatory Visit: Payer: Self-pay

## 2018-11-23 ENCOUNTER — Ambulatory Visit (INDEPENDENT_AMBULATORY_CARE_PROVIDER_SITE_OTHER): Payer: 59 | Admitting: Family Medicine

## 2018-11-23 ENCOUNTER — Encounter (INDEPENDENT_AMBULATORY_CARE_PROVIDER_SITE_OTHER): Payer: Self-pay | Admitting: Family Medicine

## 2018-11-23 VITALS — BP 128/78 | HR 67 | Temp 98.6°F | Ht 69.0 in | Wt 228.0 lb

## 2018-11-23 DIAGNOSIS — Z6833 Body mass index (BMI) 33.0-33.9, adult: Secondary | ICD-10-CM

## 2018-11-23 DIAGNOSIS — F3289 Other specified depressive episodes: Secondary | ICD-10-CM

## 2018-11-23 DIAGNOSIS — E669 Obesity, unspecified: Secondary | ICD-10-CM | POA: Diagnosis not present

## 2018-11-27 ENCOUNTER — Encounter (INDEPENDENT_AMBULATORY_CARE_PROVIDER_SITE_OTHER): Payer: Self-pay | Admitting: Family Medicine

## 2018-11-27 DIAGNOSIS — F32A Depression, unspecified: Secondary | ICD-10-CM | POA: Insufficient documentation

## 2018-11-27 DIAGNOSIS — F329 Major depressive disorder, single episode, unspecified: Secondary | ICD-10-CM | POA: Insufficient documentation

## 2018-11-27 NOTE — Progress Notes (Signed)
Office: [phone]  /  Fax: [phone]   HPI:   Chief Complaint: OBESITY Sharon Myers is here to discuss her progress with her obesity treatment plan. She is on the Category 3 plan and is following her eating plan approximately 33-40% of the time. She states she is walking 3-4 miles a day 7 times per week. [name] struggles with staying on the plan at lunch and dinner. She is doing a hybrid of journaling and Category 3. She states she does not always meet her protein goals. She is off the plan now due to her kids being home.  Her weight is 228 lb (103.4 kg) today and has had a weight loss of 8 pounds over a period of 14 weeks since her last in office visit. She has lost 32 lbs since starting treatment with [place].  Depression with emotional eating behaviors [name] is struggling with emotional eating and using food for comfort to the extent that it is negatively impacting her health. She often snacks when she is not hungry. [name] sometimes feels she is out of control and then feels guilty that she made poor food choices. She has been working on behavior modification techniques to help reduce her emotional eating and has been somewhat successful. [name] states her stress eating is increase because her kids are at home and she is also trying to work at home. She shows no sign of suicidal or homicidal ideations.  Depression screen PHQ 2/9 05/02/2018  Decreased Interest 3  Down, Depressed, Hopeless 2  PHQ - 2 Score 5  Altered sleeping 3  Tired, decreased energy 3  Change in appetite 2  Feeling bad or failure about yourself  1  Trouble concentrating 1  Moving slowly or fidgety/restless 0  Suicidal thoughts 0  PHQ-9 Score 15  Difficult doing work/chores Somewhat difficult   ASSESSMENT AND PLAN:  Other depression  Class 1 obesity with serious comorbidity and body mass index (BMI) of 33.0 to 33.9 in adult, unspecified obesity type  PLAN:  Depression with Emotional Eating Behaviors  We discussed behavior modification techniques today to help Sharon Myers deal with her emotional eating and depression. She will continue Zoloft and Wellbutrin as prescribed. She declines appointment to see Dr. Mallie Mussel at this time.  I spent > than 50% of the 15 minute visit on counseling as documented in the note.  Obesity [name] is currently in the action stage of change. As such, her goal is to continue with weight loss efforts. She has agreed to follow the Category 3 plan or journal 1600 calories + 90 grams of protein. [name] has been instructed to continue her current exercise regimen for weight loss and overall health benefits. We discussed the following Behavioral Modification Strategies today: decreasing simple carbohydrates, emotional eating strategies, planning for success, and keep a strict food journal.  [name] has agreed to follow-up with our clinic in 3 weeks. She was informed of the importance of frequent follow-up visits to maximize her success with intensive lifestyle modifications for her multiple health conditions.  ALLERGIES: No Known Allergies  MEDICATIONS: Current Outpatient Medications on File Prior to Visit  Medication Sig Dispense Refill  . buPROPion (WELLBUTRIN XL) 150 MG 24 hr tablet Take 150 mg by mouth daily.    . Prenatal Vit-Fe Fumarate-FA (MULTIVITAMIN-PRENATAL) 27-0.8 MG TABS tablet Take 1 tablet by mouth daily at 12 noon.    . sertraline (ZOLOFT) 25 MG tablet Take 25 mg by mouth at bedtime.     No current facility-administered medications on  file prior to visit.     PAST MEDICAL HISTORY: Past Medical History:  Diagnosis Date  . Allergy   . AMA (advanced maternal age) multigravida 12+   . Anxiety   . Back pain   . Depression   . Dyspnea   . Family history of blood clots 05/22/2013  . Fatigue   . Goiter, unspecified   . History of chicken pox   . Hx of varicella   . Joint pain    knees and ankles ache  . OSA on CPAP   . Other ankle sprain  and strain    achilles rupture  . Poor posture   . Poor sleep   . Ruptured lumbar disc   . Snoring   . 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 ACHILLES TENDON REPAIR     Left softball injury Dr. [name]  . BACK SURGERY     hirsch  . SKIN GRAFT      SOCIAL HISTORY: Social History   Tobacco Use  . Smoking status: Never Smoker  . Smokeless tobacco: Never Used  Substance Use Topics  . Alcohol use: Yes    Comment: occasionally  . Drug use: No    FAMILY HISTORY: Family History  Problem Relation Age of Onset  . Cancer Mother        rare blood cancer  . Stroke Mother   . Heart failure Mother   . Hypertension Father   . Nephrolithiasis Father   . Diabetes Father   . COPD Father   . Cancer Father        kidney  . Hyperlipidemia Father   . Sudden death Father   . Sleep apnea Father   . Alcoholism Father   . Obesity Father   . Thyroid disease Maternal Aunt   . Arthritis Maternal Grandmother        rheumatoid  . Heart disease Maternal Grandfather   . Cancer Maternal Grandfather        stomach  . Cancer Paternal Grandfather        stomach ca   ROS: Review of Systems  Psychiatric/Behavioral: Positive for depression (emotional eating). Negative for suicidal ideas.       Negative for homicidal ideas.   PHYSICAL EXAM: Blood pressure 128/78, pulse 67, temperature 98.6 F (37 C), temperature source Oral, height 5\' 9"  (1.753 m), weight 228 lb (103.4 kg), SpO2 96 %. Body mass index is 33.67 kg/m. Physical Exam Vitals signs reviewed.  Constitutional:      Appearance: Normal appearance. She is obese.  Cardiovascular:     Rate and Rhythm: Normal rate.     Pulses: Normal pulses.  Pulmonary:     Effort: Pulmonary effort is normal.     Breath sounds: Normal breath sounds.  Musculoskeletal: Normal range of motion.  Skin:    General: Skin is warm and dry.  Neurological:     Mental  Status: She is alert and oriented to person, place, and time.  Psychiatric:        Behavior: Behavior normal.   RECENT LABS AND TESTS: BMET    Component Value Date/Time   NA 139 08/16/2018 1257   K 4.1 08/16/2018 1257   CL 102 08/16/2018 1257   CO2 22 08/16/2018 1257   GLUCOSE 97 08/16/2018 1257   GLUCOSE 106 (H) 10/21/2016 0805   BUN 13 08/16/2018 1257   CREATININE 0.85 08/16/2018  1257   CALCIUM 9.2 08/16/2018 1257   GFRNONAA 81 08/16/2018 1257   GFRAA 94 08/16/2018 1257   Lab Results  Component Value Date   HGBA1C 5.6 08/16/2018   HGBA1C 5.5 05/02/2018   HGBA1C 6.1 10/21/2016   HGBA1C 5.4 01/08/2016   Lab Results  Component Value Date   INSULIN 6.5 08/16/2018   INSULIN 11.5 05/02/2018   CBC    Component Value Date/Time   WBC 6.9 05/02/2018 1108   WBC 6.4 10/21/2016 0805   RBC 4.89 05/02/2018 1108   RBC 4.72 10/21/2016 0805   HGB 14.4 05/02/2018 1108   HCT 43.7 05/02/2018 1108   PLT 227.0 10/21/2016 0805   MCV 89 05/02/2018 1108   MCH 29.4 05/02/2018 1108   MCH 29.3 06/28/2011 0520   MCHC 33.0 05/02/2018 1108   MCHC 32.3 10/21/2016 0805   RDW 12.9 05/02/2018 1108   LYMPHSABS 2.3 05/02/2018 1108   MONOABS 0.6 10/21/2016 0805   EOSABS 0.1 05/02/2018 1108   BASOSABS 0.0 05/02/2018 1108   Iron/TIBC/Ferritin/ %Sat    Component Value Date/Time   FERRITIN 5.3 (L) 10/21/2016 0805   Lipid Panel     Component Value Date/Time   CHOL 142 08/16/2018 1257   TRIG 52 08/16/2018 1257   HDL 49 08/16/2018 1257   CHOLHDL 3 10/21/2016 0805   VLDL 14.0 10/21/2016 0805   LDLCALC 83 08/16/2018 1257   Hepatic Function Panel     Component Value Date/Time   PROT 6.7 08/16/2018 1257   ALBUMIN 4.4 08/16/2018 1257   AST 13 08/16/2018 1257   ALT 14 08/16/2018 1257   ALKPHOS 75 08/16/2018 1257   BILITOT 0.4 08/16/2018 1257   BILIDIR 0.1 10/21/2016 0805      Component Value Date/Time   TSH 1.380 05/02/2018 1108   TSH 1.38 10/21/2016 0805   TSH 1.51 01/08/2016   TSH  1.58 12/30/2014 0804   Results for TALI, CLEAVES (MRN [id number]) as of 11/27/2018 14:47  Ref. Range 08/16/2018 12:57  Vitamin D, 25-Hydroxy Latest Ref Range: 30.0 - 100.0 ng/mL 59.3    OBESITY BEHAVIORAL INTERVENTION VISIT  Today's visit was #12    Starting weight: 260 lbs Starting date: 05/02/2018 Today's weight: 228 lbs Today's date: 11/23/2018 Total lbs lost to date: 86   ASK: We discussed the diagnosis of obesity with [name] today and Dorette agreed to give [place] permission to discuss obesity behavioral modification therapy today.  ASSESS: Chavonne has the diagnosis of obesity and her BMI today is 33.7. Riya is in the action stage of change.   ADVISE: Jaionna was educated on the multiple health risks of obesity as well as the benefit of weight loss to improve her health. She was advised of the need for long term treatment and the importance of lifestyle modifications to improve hercurrent health and to decrease her risk of future health problems.  AGREE: Multiple dietary modification options and treatment options were discussed and  Gerilynn agreed to follow the recommendations documented in the above note.  ARRANGE: Farheen was educated on the importance of frequent visits to treat obesity as outlined per CMS and USPSTF guidelines and agreed to schedule her next follow up appointment today.  IMichaelene Song, am acting as Location manager for [employer], FNP  I have reviewed the above documentation for accuracy and completeness, and I agree with the above.  - Thy Gullikson, FNP-C.

## 2018-12-04 IMAGING — DX DG KNEE AP/LAT W/ SUNRISE*L*
3 series · 3 of 3 positions shown · non-contrast
Comparison: No recent prior.

CLINICAL DATA: Bilateral knee pain.  No known injury.

EXAM:
LEFT KNEE 3 VIEWS

[knee standing ap]
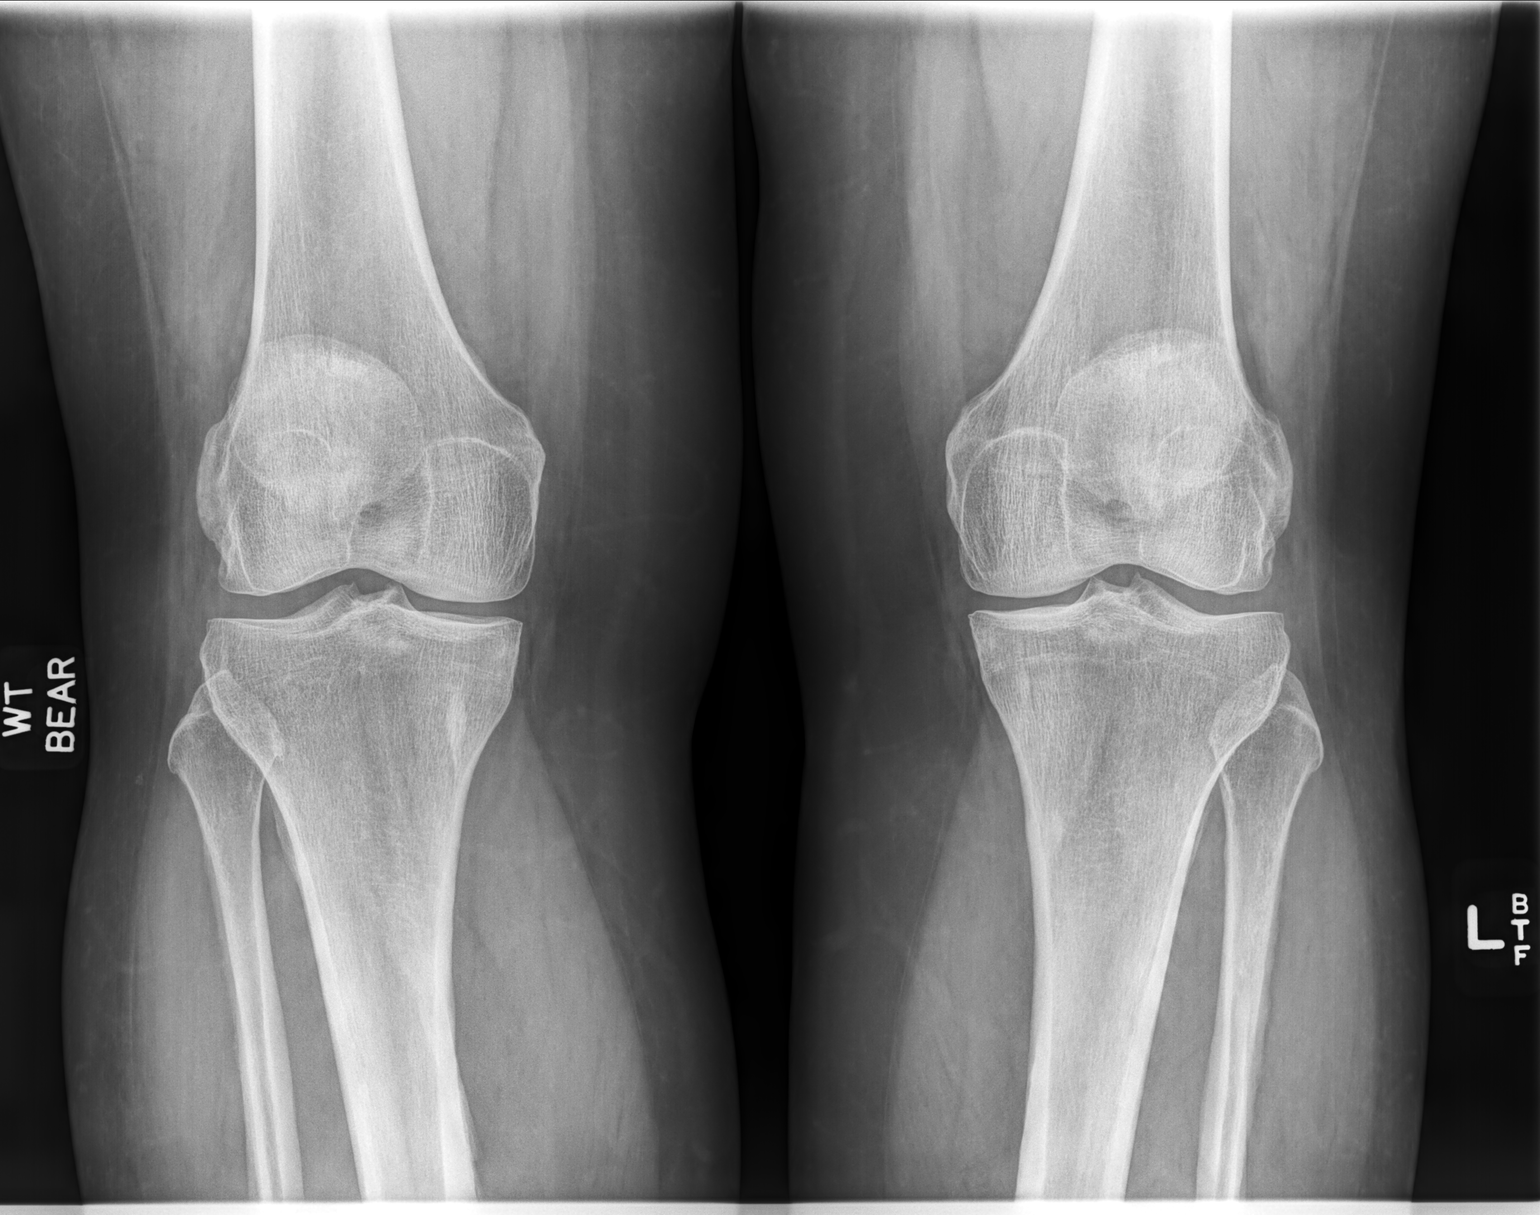

[knee standing lat]
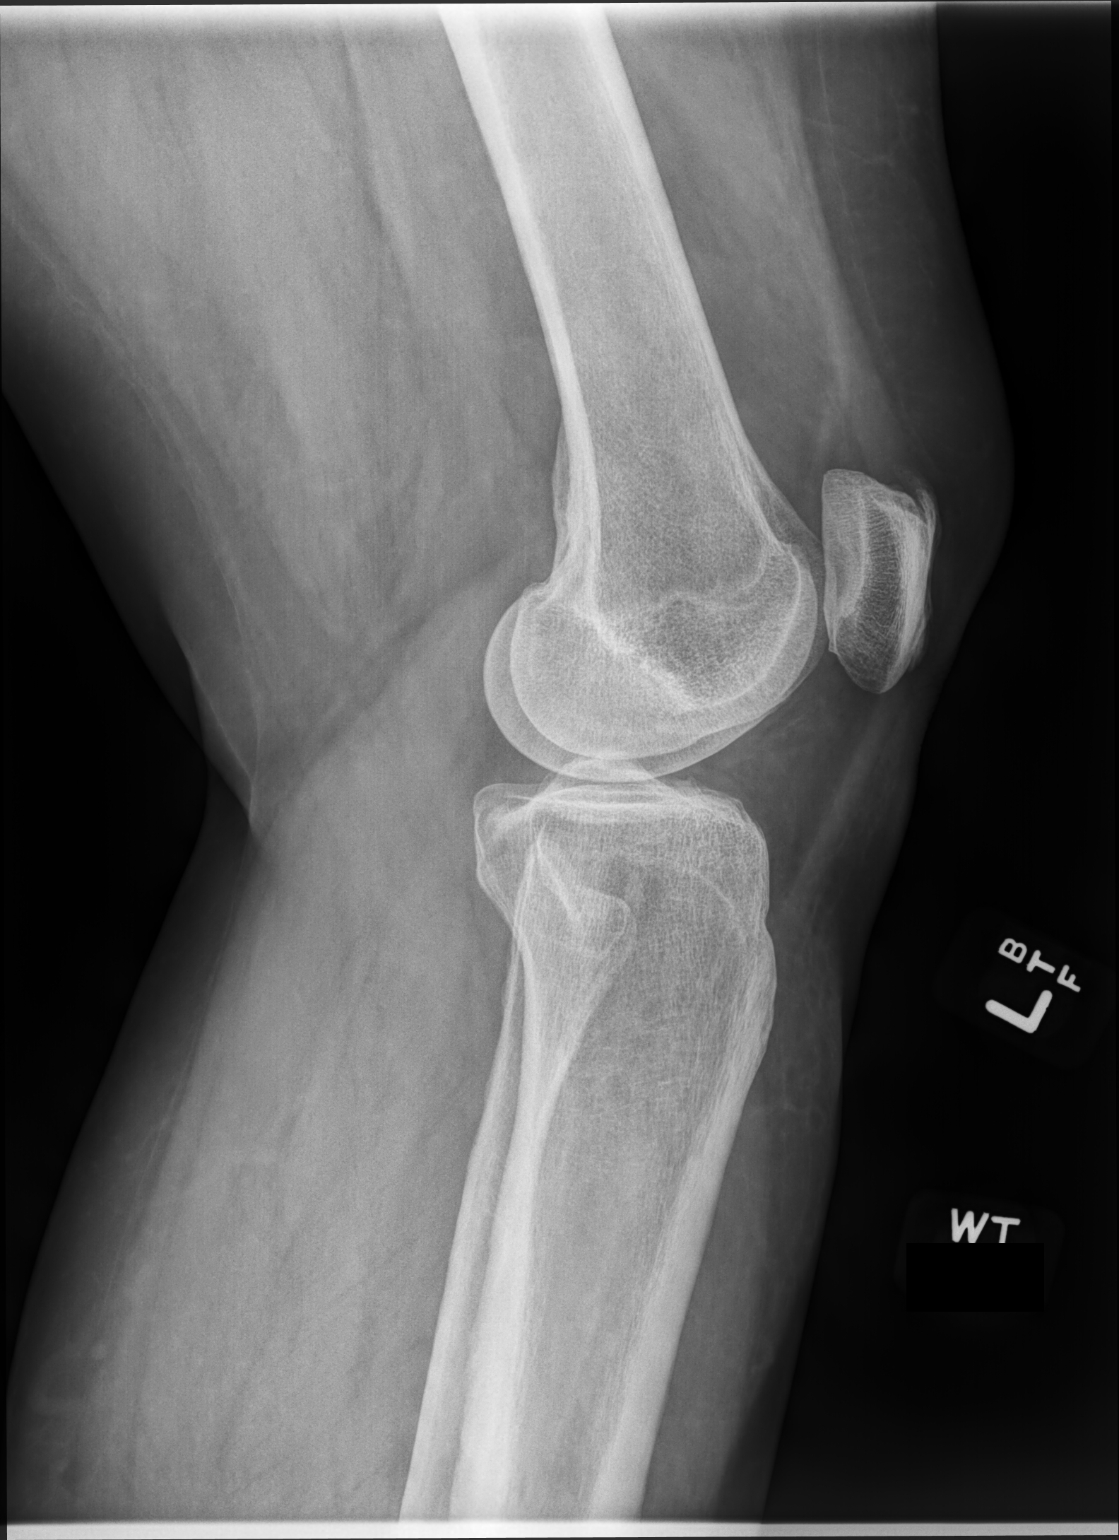

[sunrise]
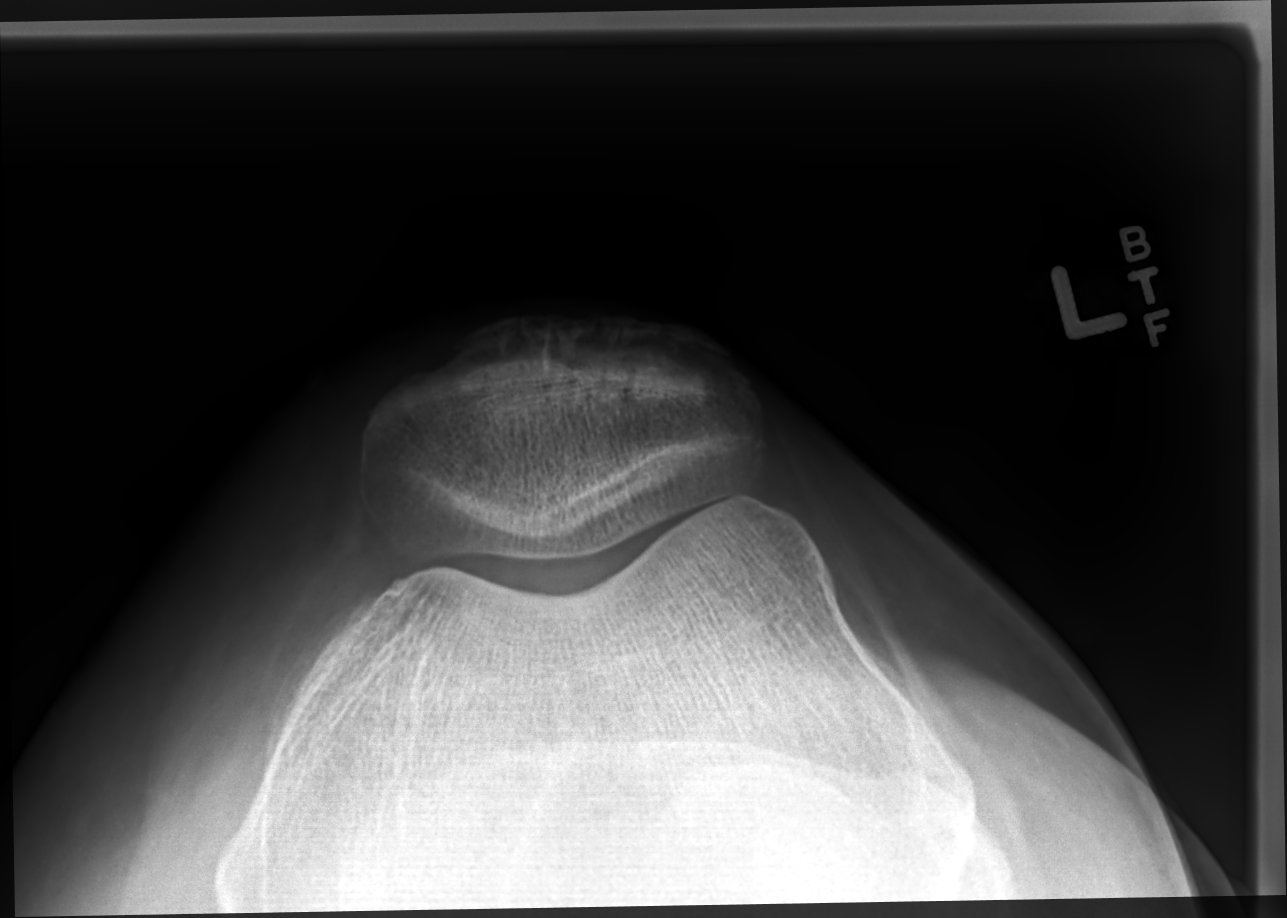

[3 of 3 positions shown; findings below may reference images not displayed]

FINDINGS: No acute bony or joint abnormality identified. No evidence of
fracture or dislocation.
IMPRESSION: No acute abnormality.

## 2018-12-12 ENCOUNTER — Encounter (INDEPENDENT_AMBULATORY_CARE_PROVIDER_SITE_OTHER): Payer: Self-pay

## 2018-12-12 ENCOUNTER — Other Ambulatory Visit: Payer: Self-pay

## 2018-12-12 ENCOUNTER — Encounter (INDEPENDENT_AMBULATORY_CARE_PROVIDER_SITE_OTHER): Payer: Self-pay | Admitting: Bariatrics

## 2018-12-12 ENCOUNTER — Telehealth (INDEPENDENT_AMBULATORY_CARE_PROVIDER_SITE_OTHER): Payer: 59 | Admitting: Bariatrics

## 2018-12-12 DIAGNOSIS — F3289 Other specified depressive episodes: Secondary | ICD-10-CM

## 2018-12-12 DIAGNOSIS — E669 Obesity, unspecified: Secondary | ICD-10-CM | POA: Diagnosis not present

## 2018-12-12 DIAGNOSIS — Z6834 Body mass index (BMI) 34.0-34.9, adult: Secondary | ICD-10-CM

## 2018-12-13 NOTE — Progress Notes (Signed)
Office: [phone]  /  Fax: [phone] TeleHealth Visit:  Sharon Myers has verbally consented to this TeleHealth visit today. The patient is located at home, the provider is located at the [facility] office. The participants in this visit include the listed provider and patient and any and all parties involved. The visit was conducted today via telephone. Mistie was unable to use realtime audiovisual technology today (unable to do FaceTime) and the telehealth visit was conducted via telephone.  HPI:   Chief Complaint: OBESITY Sharon Myers is here to discuss her progress with her obesity treatment plan. She is on the Category 3 plan or the keep a food journal with 1600 calories and 90 grams of protein daily plan and she is following her eating plan approximately 30 % of the time. She states she is walking 2 to 3 hours 2 times per week. Sharon Myers thinks that she has gained a pound or two. She has done well overall. We were unable to weigh the patient today for this TeleHealth visit. She feels as if she has gained weight since her last visit.   Depression with emotional eating behaviors Sharon Myers is struggling with stress eating and using food for comfort to the extent that it is negatively impacting her health. She often snacks when she is not hungry. Sharon Myers sometimes feels she is out of control and then feels guilty that she made poor food choices. Sharon Myers is taking Wellbutrin and Zoloft per other provider. She has been working on behavior modification techniques to help reduce her emotional eating and has been somewhat successful. She shows no sign of suicidal or homicidal ideations.  ASSESSMENT AND PLAN:  Other depression  Class 1 obesity with serious comorbidity and body mass index (BMI) of 34.0 to 34.9 in adult, unspecified obesity type  PLAN:  Depression with Emotional Eating Behaviors We discussed behavior modification techniques today to help Sharon Myers deal with  her emotional eating and depression. She will continue her medications and may consider changing Wellbutrin from XL to IR, 200 mg. Patient will think about it and she will follow up as directed. We will send goals for breakfast, lunch and dinner, via e-mail.  Obesity Sharon Myers is currently in the action stage of change. As such, her goal is to continue with weight loss efforts She has agreed to keep a food journal with 1500 calories and 90 grams of protein daily and follow the Category 3 plan for some meals Sharon Myers has been instructed to work up to a goal of 150 minutes of combined cardio and strengthening exercise per week for weight loss and overall health benefits. We discussed the following Behavioral Modification Strategies today: increase H2O intake, no skipping meals, keeping healthy foods in the home, increasing lean protein intake, decreasing simple carbohydrates, increasing vegetables, decrease eating out and work on meal planning and intentional eating Sharon Myers will continue to weigh herself at home.  Sharon Myers has agreed to follow up with our clinic in 2 weeks. She was informed of the importance of frequent follow up visits to maximize her success with intensive lifestyle modifications for her multiple health conditions.  ALLERGIES: No Known Allergies  MEDICATIONS: Current Outpatient Medications on File Prior to Visit  Medication Sig Dispense Refill  . buPROPion (WELLBUTRIN XL) 150 MG 24 hr tablet Take 150 mg by mouth daily.    . Prenatal Vit-Fe Fumarate-FA (MULTIVITAMIN-PRENATAL) 27-0.8 MG TABS tablet Take 1 tablet by mouth daily at 12 noon.    . sertraline (ZOLOFT) 25 MG tablet  Take 25 mg by mouth at bedtime.     No current facility-administered medications on file prior to visit.     PAST MEDICAL HISTORY: Past Medical History:  Diagnosis Date  . Allergy   . AMA (advanced maternal age) multigravida 75+   . Anxiety   . Back pain   . Depression   . Dyspnea   . Family  history of blood clots 05/22/2013  . Fatigue   . Goiter, unspecified   . History of chicken pox   . Hx of varicella   . Joint pain    knees and ankles ache  . OSA on CPAP   . Other ankle sprain and strain    achilles rupture  . Poor posture   . Poor sleep   . Ruptured lumbar disc   . Snoring   . 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 ACHILLES TENDON REPAIR     Left softball injury Dr. [name]  . BACK SURGERY     hirsch  . SKIN GRAFT      SOCIAL HISTORY: Social History   Tobacco Use  . Smoking status: Never Smoker  . Smokeless tobacco: Never Used  Substance Use Topics  . Alcohol use: Yes    Comment: occasionally  . Drug use: No    FAMILY HISTORY: Family History  Problem Relation Age of Onset  . Cancer Mother        rare blood cancer  . Stroke Mother   . Heart failure Mother   . Hypertension Father   . Nephrolithiasis Father   . Diabetes Father   . COPD Father   . Cancer Father        kidney  . Hyperlipidemia Father   . Sudden death Father   . Sleep apnea Father   . Alcoholism Father   . Obesity Father   . Thyroid disease Maternal Aunt   . Arthritis Maternal Grandmother        rheumatoid  . Heart disease Maternal Grandfather   . Cancer Maternal Grandfather        stomach  . Cancer Paternal Grandfather        stomach ca    ROS: Review of Systems  Constitutional: Negative for weight loss.  Psychiatric/Behavioral: Positive for depression. Negative for suicidal ideas.    PHYSICAL EXAM: Pt in no acute distress  RECENT LABS AND TESTS: BMET    Component Value Date/Time   NA 139 08/16/2018 1257   K 4.1 08/16/2018 1257   CL 102 08/16/2018 1257   CO2 22 08/16/2018 1257   GLUCOSE 97 08/16/2018 1257   GLUCOSE 106 (H) 10/21/2016 0805   BUN 13 08/16/2018 1257   CREATININE 0.85 08/16/2018 1257   CALCIUM 9.2 08/16/2018 1257   GFRNONAA 81 08/16/2018 1257   GFRAA  94 08/16/2018 1257   Lab Results  Component Value Date   HGBA1C 5.6 08/16/2018   HGBA1C 5.5 05/02/2018   HGBA1C 6.1 10/21/2016   HGBA1C 5.4 01/08/2016   Lab Results  Component Value Date   INSULIN 6.5 08/16/2018   INSULIN 11.5 05/02/2018   CBC    Component Value Date/Time   WBC 6.9 05/02/2018 1108   WBC 6.4 10/21/2016 0805   RBC 4.89 05/02/2018 1108   RBC 4.72 10/21/2016 0805   HGB 14.4 05/02/2018 1108   HCT 43.7 05/02/2018 1108   PLT 227.0 10/21/2016 0805   MCV  89 05/02/2018 1108   MCH 29.4 05/02/2018 1108   MCH 29.3 06/28/2011 0520   MCHC 33.0 05/02/2018 1108   MCHC 32.3 10/21/2016 0805   RDW 12.9 05/02/2018 1108   LYMPHSABS 2.3 05/02/2018 1108   MONOABS 0.6 10/21/2016 0805   EOSABS 0.1 05/02/2018 1108   BASOSABS 0.0 05/02/2018 1108   Iron/TIBC/Ferritin/ %Sat    Component Value Date/Time   FERRITIN 5.3 (L) 10/21/2016 0805   Lipid Panel     Component Value Date/Time   CHOL 142 08/16/2018 1257   TRIG 52 08/16/2018 1257   HDL 49 08/16/2018 1257   CHOLHDL 3 10/21/2016 0805   VLDL 14.0 10/21/2016 0805   LDLCALC 83 08/16/2018 1257   Hepatic Function Panel     Component Value Date/Time   PROT 6.7 08/16/2018 1257   ALBUMIN 4.4 08/16/2018 1257   AST 13 08/16/2018 1257   ALT 14 08/16/2018 1257   ALKPHOS 75 08/16/2018 1257   BILITOT 0.4 08/16/2018 1257   BILIDIR 0.1 10/21/2016 0805      Component Value Date/Time   TSH 1.380 05/02/2018 1108   TSH 1.38 10/21/2016 0805   TSH 1.51 01/08/2016   TSH 1.58 12/30/2014 0804     Ref. Range 08/16/2018 12:57  Vitamin D, 25-Hydroxy Latest Ref Range: 30.0 - 100.0 ng/mL 59.3    I, Doreene Nest, am acting as Location manager for [employer]. Owens Shark, DO  I have reviewed the above documentation for accuracy and completeness, and I agree with the above. -Jearld Lesch, DO

## 2018-12-26 ENCOUNTER — Encounter (INDEPENDENT_AMBULATORY_CARE_PROVIDER_SITE_OTHER): Payer: Self-pay

## 2018-12-27 ENCOUNTER — Ambulatory Visit (INDEPENDENT_AMBULATORY_CARE_PROVIDER_SITE_OTHER): Payer: 59 | Admitting: Bariatrics

## 2018-12-27 ENCOUNTER — Encounter (INDEPENDENT_AMBULATORY_CARE_PROVIDER_SITE_OTHER): Payer: Self-pay | Admitting: Bariatrics

## 2018-12-27 ENCOUNTER — Other Ambulatory Visit: Payer: Self-pay

## 2018-12-27 VITALS — BP 133/88 | HR 72 | Temp 98.2°F | Ht 69.0 in | Wt 230.0 lb

## 2018-12-27 DIAGNOSIS — Z6834 Body mass index (BMI) 34.0-34.9, adult: Secondary | ICD-10-CM

## 2018-12-27 DIAGNOSIS — Z9189 Other specified personal risk factors, not elsewhere classified: Secondary | ICD-10-CM

## 2018-12-27 DIAGNOSIS — E559 Vitamin D deficiency, unspecified: Secondary | ICD-10-CM | POA: Diagnosis not present

## 2018-12-27 DIAGNOSIS — E669 Obesity, unspecified: Secondary | ICD-10-CM | POA: Diagnosis not present

## 2018-12-27 DIAGNOSIS — F3289 Other specified depressive episodes: Secondary | ICD-10-CM

## 2018-12-27 MED ORDER — BUPROPION HCL ER (SR) 150 MG PO TB12: 150.0000 mg | ORAL_TABLET | Freq: Every day | ORAL | 0 refills | Status: DC

## 2018-12-27 NOTE — Progress Notes (Signed)
Office: [phone]  /  Fax: [phone]   HPI:   Chief Complaint: OBESITY Sharon Myers is here to discuss her progress with her obesity treatment plan. She is on the Category 3 plan and is following her eating plan approximately 0 % of the time. She states she is exercising 0 minutes 0 times per week. Sharon Myers is up 2 lbs. She has not done anything over the last few weeks. She has not walked due to the heat. She is drinking adequate water. She is not getting enough protein.  Her weight is 230 lb (104.3 kg) today and has gained 2 lbs since her last visit. She has lost 30 lbs since starting treatment with [place].  Vitamin D Deficiency Sharon Myers has a diagnosis of vitamin D deficiency. She is currently taking OTC multivitamins and denies nausea, vomiting or muscle weakness.  At risk for osteopenia and osteoporosis Sharon Myers is at higher risk of osteopenia and osteoporosis due to vitamin D deficiency.   Depression with Emotional Eating Behaviors Sharon Myers is is on Wellbutrin and she struggles with emotional eating and using food for comfort to the extent that it is negatively impacting her health. She often snacks when she is not hungry. Sharon Myers sometimes feels she is out of control and then feels guilty that she made poor food choices. She has been working on behavior modification techniques to help reduce her emotional eating and has been somewhat successful. She shows no sign of suicidal or homicidal ideations.  Depression screen PHQ 2/9 05/02/2018  Decreased Interest 3  Down, Depressed, Hopeless 2  PHQ - 2 Score 5  Altered sleeping 3  Tired, decreased energy 3  Change in appetite 2  Feeling bad or failure about yourself  1  Trouble concentrating 1  Moving slowly or fidgety/restless 0  Suicidal thoughts 0  PHQ-9 Score 15  Difficult doing work/chores Somewhat difficult    ASSESSMENT AND PLAN:  Other depression - Plan: buPROPion (WELLBUTRIN SR) 150 MG 12 hr tablet  Vitamin D  deficiency  At risk for osteoporosis  Class 1 obesity with serious comorbidity and body mass index (BMI) of 34.0 to 34.9 in adult, unspecified obesity type  PLAN:  Vitamin D Deficiency Valeen was informed that low vitamin D levels contributes to fatigue and are associated with obesity, breast, and colon cancer. Letishia agrees to continue taking OTC multivitamins and will follow up for routine testing of vitamin D, at least 2-3 times per year. She was informed of the risk of over-replacement of vitamin D and agrees to not increase her dose unless she discusses this with [place] first. Sury agrees to follow up with our clinic in 2 weeks.  At risk for osteopenia and osteoporosis [name] was given extended (15 minutes) osteoporosis prevention counseling today. [name] is at risk for osteopenia and osteoporsis due to her vitamin D deficiency. She was encouraged to take her vitamin D and follow her higher calcium diet and increase strengthening exercise to help strengthen her bones and decrease her risk of osteopenia and osteoporosis.  Depression with Emotional Eating Behaviors We discussed cognitive behavioral therapy techniques today to help Sharon Myers deal with her emotional eating and depression. Sharon Myers agrees to continue taking Wellbutrin XL 150 mg 1 tablet q daily #90 day supply with no refills. Sharon Myers agrees to follow up with our clinic in 2 weeks.  Obesity Sharon Myers is currently in the action stage of change. As such, her goal is to continue with weight loss efforts She has agreed to keep a food  journal with 1500 calories and 90 grams of protein daily or follow the Category 3 plan (alternate) Sharon Myers has been instructed to work up to a goal of 150 minutes of combined cardio and strengthening exercise per week or increase exercise in the Fall for weight loss and overall health benefits. We discussed the following Behavioral Modification Strategies today: increasing lean protein intake,  decreasing simple carbohydrates, increasing vegetables, increase H20 intake, decrease eating out, no skipping meals, work on meal planning and easy cooking plans, and keeping healthy foods in the home   Sharon Myers has agreed to follow up with our clinic in 2 weeks. She was informed of the importance of frequent follow up visits to maximize her success with intensive lifestyle modifications for her multiple health conditions.  ALLERGIES: No Known Allergies  MEDICATIONS: Current Outpatient Medications on File Prior to Visit  Medication Sig Dispense Refill  . Prenatal Vit-Fe Fumarate-FA (MULTIVITAMIN-PRENATAL) 27-0.8 MG TABS tablet Take 1 tablet by mouth daily at 12 noon.    . sertraline (ZOLOFT) 25 MG tablet Take 25 mg by mouth at bedtime.     No current facility-administered medications on file prior to visit.     PAST MEDICAL HISTORY: Past Medical History:  Diagnosis Date  . Allergy   . AMA (advanced maternal age) multigravida 35+   . Anxiety   . Back pain   . Depression   . Dyspnea   . Family history of blood clots 05/22/2013  . Fatigue   . Goiter, unspecified   . History of chicken pox   . Hx of varicella   . Joint pain    knees and ankles ache  . OSA on CPAP   . Other ankle sprain and strain    achilles rupture  . Poor posture   . Poor sleep   . Ruptured lumbar disc   . Snoring   . 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 ACHILLES TENDON REPAIR     Left softball injury Dr. [name]  . BACK SURGERY     hirsch  . SKIN GRAFT      SOCIAL HISTORY: Social History   Tobacco Use  . Smoking status: Never Smoker  . Smokeless tobacco: Never Used  Substance Use Topics  . Alcohol use: Yes    Comment: occasionally  . Drug use: No    FAMILY HISTORY: Family History  Problem Relation Age of Onset  . Cancer Mother        rare blood cancer  . Stroke Mother   . Heart failure Mother    . Hypertension Father   . Nephrolithiasis Father   . Diabetes Father   . COPD Father   . Cancer Father        kidney  . Hyperlipidemia Father   . Sudden death Father   . Sleep apnea Father   . Alcoholism Father   . Obesity Father   . Thyroid disease Maternal Aunt   . Arthritis Maternal Grandmother        rheumatoid  . Heart disease Maternal Grandfather   . Cancer Maternal Grandfather        stomach  . Cancer Paternal Grandfather        stomach ca    ROS: Review of Systems  Constitutional: Negative for weight loss.  Gastrointestinal: Negative for nausea and vomiting.  Musculoskeletal:       Negative muscle weakness  Psychiatric/Behavioral:  Positive for depression. Negative for suicidal ideas.    PHYSICAL EXAM: Blood pressure 133/88, pulse 72, temperature 98.2 F (36.8 C), temperature source Oral, height 5\' 9"  (1.753 m), weight 230 lb (104.3 kg), SpO2 98 %. Body mass index is 33.97 kg/m. Physical Exam Vitals signs reviewed.  Constitutional:      Appearance: Normal appearance. She is obese.  Cardiovascular:     Rate and Rhythm: Normal rate.     Pulses: Normal pulses.  Pulmonary:     Effort: Pulmonary effort is normal.     Breath sounds: Normal breath sounds.  Musculoskeletal: Normal range of motion.  Skin:    General: Skin is warm and dry.  Neurological:     Mental Status: She is alert and oriented to person, place, and time.  Psychiatric:        Mood and Affect: Mood normal.        Behavior: Behavior normal.     RECENT LABS AND TESTS: BMET    Component Value Date/Time   NA 139 08/16/2018 1257   K 4.1 08/16/2018 1257   CL 102 08/16/2018 1257   CO2 22 08/16/2018 1257   GLUCOSE 97 08/16/2018 1257   GLUCOSE 106 (H) 10/21/2016 0805   BUN 13 08/16/2018 1257   CREATININE 0.85 08/16/2018 1257   CALCIUM 9.2 08/16/2018 1257   GFRNONAA 81 08/16/2018 1257   GFRAA 94 08/16/2018 1257   Lab Results  Component Value Date   HGBA1C 5.6 08/16/2018   HGBA1C 5.5  05/02/2018   HGBA1C 6.1 10/21/2016   HGBA1C 5.4 01/08/2016   Lab Results  Component Value Date   INSULIN 6.5 08/16/2018   INSULIN 11.5 05/02/2018   CBC    Component Value Date/Time   WBC 6.9 05/02/2018 1108   WBC 6.4 10/21/2016 0805   RBC 4.89 05/02/2018 1108   RBC 4.72 10/21/2016 0805   HGB 14.4 05/02/2018 1108   HCT 43.7 05/02/2018 1108   PLT 227.0 10/21/2016 0805   MCV 89 05/02/2018 1108   MCH 29.4 05/02/2018 1108   MCH 29.3 06/28/2011 0520   MCHC 33.0 05/02/2018 1108   MCHC 32.3 10/21/2016 0805   RDW 12.9 05/02/2018 1108   LYMPHSABS 2.3 05/02/2018 1108   MONOABS 0.6 10/21/2016 0805   EOSABS 0.1 05/02/2018 1108   BASOSABS 0.0 05/02/2018 1108   Iron/TIBC/Ferritin/ %Sat    Component Value Date/Time   FERRITIN 5.3 (L) 10/21/2016 0805   Lipid Panel     Component Value Date/Time   CHOL 142 08/16/2018 1257   TRIG 52 08/16/2018 1257   HDL 49 08/16/2018 1257   CHOLHDL 3 10/21/2016 0805   VLDL 14.0 10/21/2016 0805   LDLCALC 83 08/16/2018 1257   Hepatic Function Panel     Component Value Date/Time   PROT 6.7 08/16/2018 1257   ALBUMIN 4.4 08/16/2018 1257   AST 13 08/16/2018 1257   ALT 14 08/16/2018 1257   ALKPHOS 75 08/16/2018 1257   BILITOT 0.4 08/16/2018 1257   BILIDIR 0.1 10/21/2016 0805      Component Value Date/Time   TSH 1.380 05/02/2018 1108   TSH 1.38 10/21/2016 0805   TSH 1.51 01/08/2016   TSH 1.58 12/30/2014 0804      OBESITY BEHAVIORAL INTERVENTION VISIT  Today's visit was # 14   Starting weight: 260 lbs Starting date: 05/02/18 Today's weight : 230 lbs  Today's date: 12/27/2018 Total lbs lost to date: 3    ASK: We discussed the diagnosis of obesity with [name]  today and Keyah agreed to give [place] permission to discuss obesity behavioral modification therapy today.  ASSESS: Cadience has the diagnosis of obesity and her BMI today is 33.95 Nyesha is in the action stage of change   ADVISE: Ruchy was educated on the  multiple health risks of obesity as well as the benefit of weight loss to improve her health. She was advised of the need for long term treatment and the importance of lifestyle modifications to improve her current health and to decrease her risk of future health problems.  AGREE: Multiple dietary modification options and treatment options were discussed and  Korrine agreed to follow the recommendations documented in the above note.  ARRANGE: Bradi was educated on the importance of frequent visits to treat obesity as outlined per CMS and USPSTF guidelines and agreed to schedule her next follow up appointment today.  Wilhemena Durie, am acting as transcriptionist for [employer], DO  I have reviewed the above documentation for accuracy and completeness, and I agree with the above. -Jearld Lesch, DO

## 2019-01-01 ENCOUNTER — Encounter (INDEPENDENT_AMBULATORY_CARE_PROVIDER_SITE_OTHER): Payer: Self-pay | Admitting: Bariatrics

## 2019-01-11 ENCOUNTER — Ambulatory Visit (INDEPENDENT_AMBULATORY_CARE_PROVIDER_SITE_OTHER): Payer: 59 | Admitting: Pulmonary Disease

## 2019-01-11 ENCOUNTER — Other Ambulatory Visit: Payer: Self-pay

## 2019-01-11 ENCOUNTER — Encounter: Payer: Self-pay | Admitting: Pulmonary Disease

## 2019-01-11 DIAGNOSIS — G4733 Obstructive sleep apnea (adult) (pediatric): Secondary | ICD-10-CM

## 2019-01-11 NOTE — Patient Instructions (Signed)
Obstructive sleep apnea  We will obtain a repeat home sleep study to ascertain if you still have significant sleep disordered breathing Commend you on your weight loss efforts  We will reach out to you following obtaining study results  Call with significant concerns

## 2019-01-11 NOTE — Progress Notes (Addendum)
Virtual Visit via Telephone Note  I connected with Sharon Myers on 01/11/19 at  4:30 PM EDT by telephone and verified that I am speaking with the correct person using two identifiers.  Location: Patient: Sharon Myers Provider: Adrian Prince discussed the limitations, risks, security and privacy concerns of performing an evaluation and management service by telephone and the availability of in person appointments. I also discussed with the patient that there may be a patient responsible charge related to this service. The patient expressed understanding and agreed to proceed.   History of Present Illness: History of obstructive sleep apnea on CPAP therapy Feeling better generally, 30 pound weight loss since starting diet and exercise She feels better globally She still does have some daytime sleepiness but feels CPAP is not necessarily helping  She had mild obstructive sleep apnea at onset and feels this may be better   Observations/Objective:  Feels cold generally No significant health issues since last visit Assessment and Plan: Obstructive sleep apnea-mild Considering discontinuation of CPAP therapy  We did talk about obtaining a repeat home sleep study to ascertain whether she still have significant sleep disordered breathing  Compliance data reviewed showing 80% compliance, median pressure of 8, residual AHI 1.8  Follow Up Instructions:  Obtain a home sleep study-follow-up with patient following obtaining the study  I discussed the assessment and treatment plan with the patient. The patient was provided an opportunity to ask questions and all were answered. The patient agreed with the plan and demonstrated an understanding of the instructions.   The patient was advised to call back or seek an in-person evaluation if the symptoms worsen or if the condition fails to improve as anticipated.  I provided 12 minutes of non-face-to-face time during this encounter.   Laurin Coder, MD

## 2019-01-15 ENCOUNTER — Encounter (INDEPENDENT_AMBULATORY_CARE_PROVIDER_SITE_OTHER): Payer: Self-pay | Admitting: Bariatrics

## 2019-01-15 ENCOUNTER — Ambulatory Visit (INDEPENDENT_AMBULATORY_CARE_PROVIDER_SITE_OTHER): Payer: 59 | Admitting: Bariatrics

## 2019-01-15 ENCOUNTER — Other Ambulatory Visit: Payer: Self-pay

## 2019-01-15 DIAGNOSIS — Z6834 Body mass index (BMI) 34.0-34.9, adult: Secondary | ICD-10-CM

## 2019-01-15 DIAGNOSIS — E669 Obesity, unspecified: Secondary | ICD-10-CM

## 2019-01-15 DIAGNOSIS — F3289 Other specified depressive episodes: Secondary | ICD-10-CM

## 2019-01-15 DIAGNOSIS — E559 Vitamin D deficiency, unspecified: Secondary | ICD-10-CM | POA: Diagnosis not present

## 2019-01-16 ENCOUNTER — Encounter (INDEPENDENT_AMBULATORY_CARE_PROVIDER_SITE_OTHER): Payer: Self-pay | Admitting: Bariatrics

## 2019-01-16 NOTE — Progress Notes (Signed)
Office: [phone]  /  Fax: [phone] TeleHealth Visit:  Sharon Myers has verbally consented to this TeleHealth visit today. The patient is located at home, the provider is located at the [facility] office. The participants in this visit include the listed provider and patient and any and all parties involved. The visit was conducted today via telephone. Sharon Myers was unable to use realtime audiovisual technology today and the telehealth visit was conducted via telephone.  HPI:   Chief Complaint: OBESITY Sharon is here to discuss her progress with her obesity treatment plan. She is on the keep a food journal with 1500 calories and 90 grams of protein daily or the Category 3 plan and she is following her eating plan approximately 0 % of the time. She states she is exercising 0 minutes 0 times per week. Sharon Myers is up six pounds, per patient (weight 236 lbs, date not reported). She has had some stress eating. Sharon Myers is drinking adequate water. She is not getting adequate protein. We were unable to weigh the patient today for this TeleHealth visit. She feels as if she has gained weight since her last visit. She has lost 30 lbs since starting treatment with [place].  Vitamin D deficiency Sharon Myers has a diagnosis of vitamin D deficiency. Her last vitamin D level was at 59.3 Sharon Myers is currently taking OTC Vit D and she denies nausea, vomiting or muscle weakness.  Depression with emotional eating behaviors Sharon Myers struggles with emotional eating and using food for comfort to the extent that it is negatively impacting her health. She often snacks when she is not hungry. Sharon Myers sometimes feels she is out of control and then feels guilty that she made poor food choices. Sharon Myers is taking Wellbutrin and Zoloft per her PCP. She has been working on behavior modification techniques to help reduce her emotional eating and has been somewhat successful. She shows no sign of suicidal or  homicidal ideations.  ASSESSMENT AND PLAN:  Vitamin D deficiency  Other depression  Class 1 obesity with serious comorbidity and body mass index (BMI) of 34.0 to 34.9 in adult, unspecified obesity type  PLAN:  Vitamin D Deficiency [name] was informed that low vitamin D levels contributes to fatigue and are associated with obesity, breast, and colon cancer. [name] will continue to take OTC Vitamin D and she will follow up for routine testing of vitamin D, at least 2-3 times per year. She was informed of the risk of over-replacement of vitamin D and agrees to not increase her dose unless she discusses this with [place] first.  Depression with Emotional Eating Behaviors We discussed behavior modification techniques today to help Sharon Myers deal with her emotional eating and depression. She will continue Wellbutrin and Zoloft and follow up as directed.  Obesity Sharon Myers is currently in the action stage of change. As such, her goal is to continue with weight loss efforts She has agreed to keep a food journal with 1500 calories and 90 grams of protein daily or follow the Category 3 plan [name] would consider exercise (YouTube) at home for weight loss and overall health benefits. We discussed the following Behavioral Modification Strategies today: increase H2O intake, no skipping meals, keeping healthy foods in the home, increasing lean protein intake, decreasing simple carbohydrates, increasing vegetables, decrease eating out and work on meal planning and intentional eating  Sharon Myers has agreed to follow up with our clinic in 2 weeks. She was informed of the importance of frequent follow up visits to maximize her  success with intensive lifestyle modifications for her multiple health conditions.  ALLERGIES: No Known Allergies  MEDICATIONS: Current Outpatient Medications on File Prior to Visit  Medication Sig Dispense Refill  . buPROPion (WELLBUTRIN SR) 150 MG 12 hr tablet Take 1 tablet (150  mg total) by mouth daily. 90 tablet 0  . Prenatal Vit-Fe Fumarate-FA (MULTIVITAMIN-PRENATAL) 27-0.8 MG TABS tablet Take 1 tablet by mouth daily at 12 noon.    . sertraline (ZOLOFT) 25 MG tablet Take 25 mg by mouth at bedtime.    [name] Kitchen VITAMIN D PO Take by mouth daily.     No current facility-administered medications on file prior to visit.     PAST MEDICAL HISTORY: Past Medical History:  Diagnosis Date  . Allergy   . AMA (advanced maternal age) multigravida 62+   . Anxiety   . Back pain   . Depression   . Dyspnea   . Family history of blood clots 05/22/2013  . Fatigue   . Goiter, unspecified   . History of chicken pox   . Hx of varicella   . Joint pain    knees and ankles ache  . OSA on CPAP   . Other ankle sprain and strain    achilles rupture  . Poor posture   . Poor sleep   . Ruptured lumbar disc   . Snoring   . 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 ACHILLES TENDON REPAIR     Left softball injury Dr. [name]  . BACK SURGERY     hirsch  . SKIN GRAFT      SOCIAL HISTORY: Social History   Tobacco Use  . Smoking status: Never Smoker  . Smokeless tobacco: Never Used  Substance Use Topics  . Alcohol use: Yes    Comment: occasionally  . Drug use: No    FAMILY HISTORY: Family History  Problem Relation Age of Onset  . Cancer Mother        rare blood cancer  . Stroke Mother   . Heart failure Mother   . Hypertension Father   . Nephrolithiasis Father   . Diabetes Father   . COPD Father   . Cancer Father        kidney  . Hyperlipidemia Father   . Sudden death Father   . Sleep apnea Father   . Alcoholism Father   . Obesity Father   . Thyroid disease Maternal Aunt   . Arthritis Maternal Grandmother        rheumatoid  . Heart disease Maternal Grandfather   . Cancer Maternal Grandfather        stomach  . Cancer Paternal Grandfather        stomach ca    ROS: Review  of Systems  Constitutional: Negative for weight loss.  Gastrointestinal: Negative for nausea and vomiting.  Musculoskeletal:       Negative for muscle weakness  Psychiatric/Behavioral: Positive for depression. Negative for suicidal ideas.    PHYSICAL EXAM: Pt in no acute distress  RECENT LABS AND TESTS: BMET    Component Value Date/Time   NA 139 08/16/2018 1257   K 4.1 08/16/2018 1257   CL 102 08/16/2018 1257   CO2 22 08/16/2018 1257   GLUCOSE 97 08/16/2018 1257   GLUCOSE 106 (H) 10/21/2016 0805   BUN 13 08/16/2018 1257   CREATININE 0.85 08/16/2018 1257   CALCIUM 9.2 08/16/2018 1257  GFRNONAA 81 08/16/2018 1257   GFRAA 94 08/16/2018 1257   Lab Results  Component Value Date   HGBA1C 5.6 08/16/2018   HGBA1C 5.5 05/02/2018   HGBA1C 6.1 10/21/2016   HGBA1C 5.4 01/08/2016   Lab Results  Component Value Date   INSULIN 6.5 08/16/2018   INSULIN 11.5 05/02/2018   CBC    Component Value Date/Time   WBC 6.9 05/02/2018 1108   WBC 6.4 10/21/2016 0805   RBC 4.89 05/02/2018 1108   RBC 4.72 10/21/2016 0805   HGB 14.4 05/02/2018 1108   HCT 43.7 05/02/2018 1108   PLT 227.0 10/21/2016 0805   MCV 89 05/02/2018 1108   MCH 29.4 05/02/2018 1108   MCH 29.3 06/28/2011 0520   MCHC 33.0 05/02/2018 1108   MCHC 32.3 10/21/2016 0805   RDW 12.9 05/02/2018 1108   LYMPHSABS 2.3 05/02/2018 1108   MONOABS 0.6 10/21/2016 0805   EOSABS 0.1 05/02/2018 1108   BASOSABS 0.0 05/02/2018 1108   Iron/TIBC/Ferritin/ %Sat    Component Value Date/Time   FERRITIN 5.3 (L) 10/21/2016 0805   Lipid Panel     Component Value Date/Time   CHOL 142 08/16/2018 1257   TRIG 52 08/16/2018 1257   HDL 49 08/16/2018 1257   CHOLHDL 3 10/21/2016 0805   VLDL 14.0 10/21/2016 0805   LDLCALC 83 08/16/2018 1257   Hepatic Function Panel     Component Value Date/Time   PROT 6.7 08/16/2018 1257   ALBUMIN 4.4 08/16/2018 1257   AST 13 08/16/2018 1257   ALT 14 08/16/2018 1257   ALKPHOS 75 08/16/2018 1257    BILITOT 0.4 08/16/2018 1257   BILIDIR 0.1 10/21/2016 0805      Component Value Date/Time   TSH 1.380 05/02/2018 1108   TSH 1.38 10/21/2016 0805   TSH 1.51 01/08/2016   TSH 1.58 12/30/2014 0804     Ref. Range 08/16/2018 12:57  Vitamin D, 25-Hydroxy Latest Ref Range: 30.0 - 100.0 ng/mL 59.3    I, Doreene Nest, am acting as Location manager for [employer]. Owens Shark, DO  I have reviewed the above documentation for accuracy and completeness, and I agree with the above. -Jearld Lesch, DO

## 2019-02-05 ENCOUNTER — Encounter (INDEPENDENT_AMBULATORY_CARE_PROVIDER_SITE_OTHER): Payer: Self-pay | Admitting: Bariatrics

## 2019-02-05 ENCOUNTER — Other Ambulatory Visit: Payer: Self-pay

## 2019-02-05 ENCOUNTER — Telehealth (INDEPENDENT_AMBULATORY_CARE_PROVIDER_SITE_OTHER): Payer: 59 | Admitting: Bariatrics

## 2019-02-05 DIAGNOSIS — E559 Vitamin D deficiency, unspecified: Secondary | ICD-10-CM | POA: Diagnosis not present

## 2019-02-05 DIAGNOSIS — E669 Obesity, unspecified: Secondary | ICD-10-CM | POA: Diagnosis not present

## 2019-02-05 DIAGNOSIS — Z6834 Body mass index (BMI) 34.0-34.9, adult: Secondary | ICD-10-CM | POA: Diagnosis not present

## 2019-02-05 DIAGNOSIS — F3289 Other specified depressive episodes: Secondary | ICD-10-CM | POA: Diagnosis not present

## 2019-02-06 NOTE — Progress Notes (Signed)
Office: [phone]  /  Fax: [phone] TeleHealth Visit:  Sharon Myers has verbally consented to this TeleHealth visit today. The patient is located at home, the provider is located at the [facility] office. The participants in this visit include the listed provider and patient and any and all parties involved. The visit was conducted today via FaceTime.  HPI:   Chief Complaint: OBESITY Sharon Myers is here to discuss her progress with her obesity treatment plan. She is on the keep a food journal with 1800 calories and 90 grams of protein daily and follow the Category 3 plan and is following her eating plan approximately 0 % of the time. She states she is exercising 0 minutes 0 times per week. Teshauna states that her weight remains the same. The wheels are off the bus. We were unable to weigh the patient today for this TeleHealth visit. She feels as if she has maintained weight since her last visit. She has lost 30 lbs since starting treatment with [place].  Vitamin D deficiency Sharon Myers has a diagnosis of vitamin D deficiency. She is currently taking vit D and denies nausea, vomiting or muscle weakness.  Depression with emotional eating behaviors Sharon Myers is struggling with emotional eating and using food for comfort to the extent that it is negatively impacting her health. She often snacks when she is not hungry. Sharon Myers sometimes feels she is out of control and then feels guilty that she made poor food choices. Sharon Myers is taking Wellbutrin and she also takes Zoloft per her PCP. She has been working on behavior modification techniques to help reduce her emotional eating and has been somewhat successful. She shows no sign of suicidal or homicidal ideations.  ASSESSMENT AND PLAN:  Vitamin D deficiency  Other depression - with emotional eating  Class 1 obesity with serious comorbidity and body mass index (BMI) of 34.0 to 34.9 in adult, unspecified obesity type  PLAN:   Vitamin D Deficiency Sharon Myers was informed that low vitamin D levels contributes to fatigue and are associated with obesity, breast, and colon cancer. Sharon Myers will continue to take OTC Vit D and she will follow up for routine testing of vitamin D, at least 2-3 times per year. She was informed of the risk of over-replacement of vitamin D and agrees to not increase her dose unless she discusses this with [place] first.  Depression with Emotional Eating Behaviors We discussed behavior modification techniques today to help Sharon Myers deal with her emotional eating and depression. She will continue to take Wellbutrin SR 150 mg daily and follow up as directed.  Obesity Sharon Myers is currently in the action stage of change. As such, her goal is to continue with weight loss efforts She has agreed to keep a food journal with 1500 calories and 90 grams of protein daily and follow the Category 3 plan Sharon Myers will continue walking and starting a walking challenge at work for weight loss and overall health benefits. We discussed the following Behavioral Modification Strategies today: increase H2O intake, no skipping meals, keeping healthy foods in the home, increasing lean protein intake, decreasing simple carbohydrates, increasing vegetables, decrease eating out and work on meal planning and intentional eating  Sharon Myers has agreed to follow up with our clinic in 3 to 4 weeks. She was informed of the importance of frequent follow up visits to maximize her success with intensive lifestyle modifications for her multiple health conditions.  ALLERGIES: No Known Allergies  MEDICATIONS: Current Outpatient Medications on File Prior to Visit  Medication Sig Dispense Refill  . buPROPion (WELLBUTRIN SR) 150 MG 12 hr tablet Take 1 tablet (150 mg total) by mouth daily. 90 tablet 0  . Prenatal Vit-Fe Fumarate-FA (MULTIVITAMIN-PRENATAL) 27-0.8 MG TABS tablet Take 1 tablet by mouth daily at 12 noon.    . sertraline (ZOLOFT) 25  MG tablet Take 25 mg by mouth at bedtime.    [name] Kitchen VITAMIN D PO Take by mouth daily.     No current facility-administered medications on file prior to visit.     PAST MEDICAL HISTORY: Past Medical History:  Diagnosis Date  . Allergy   . AMA (advanced maternal age) multigravida 8+   . Anxiety   . Back pain   . Depression   . Dyspnea   . Family history of blood clots 05/22/2013  . Fatigue   . Goiter, unspecified   . History of chicken pox   . Hx of varicella   . Joint pain    knees and ankles ache  . OSA on CPAP   . Other ankle sprain and strain    achilles rupture  . Poor posture   . Poor sleep   . Ruptured lumbar disc   . Snoring   . 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 ACHILLES TENDON REPAIR     Left softball injury Dr. [name]  . BACK SURGERY     hirsch  . SKIN GRAFT      SOCIAL HISTORY: Social History   Tobacco Use  . Smoking status: Never Smoker  . Smokeless tobacco: Never Used  Substance Use Topics  . Alcohol use: Yes    Comment: occasionally  . Drug use: No    FAMILY HISTORY: Family History  Problem Relation Age of Onset  . Cancer Mother        rare blood cancer  . Stroke Mother   . Heart failure Mother   . Hypertension Father   . Nephrolithiasis Father   . Diabetes Father   . COPD Father   . Cancer Father        kidney  . Hyperlipidemia Father   . Sudden death Father   . Sleep apnea Father   . Alcoholism Father   . Obesity Father   . Thyroid disease Maternal Aunt   . Arthritis Maternal Grandmother        rheumatoid  . Heart disease Maternal Grandfather   . Cancer Maternal Grandfather        stomach  . Cancer Paternal Grandfather        stomach ca    ROS: Review of Systems  Constitutional: Negative for weight loss.  Gastrointestinal: Negative for nausea and vomiting.  Musculoskeletal:       Negative for muscle weakness   Psychiatric/Behavioral: Positive for depression. Negative for suicidal ideas.    PHYSICAL EXAM: Pt in no acute distress  RECENT LABS AND TESTS: BMET    Component Value Date/Time   NA 139 08/16/2018 1257   K 4.1 08/16/2018 1257   CL 102 08/16/2018 1257   CO2 22 08/16/2018 1257   GLUCOSE 97 08/16/2018 1257   GLUCOSE 106 (H) 10/21/2016 0805   BUN 13 08/16/2018 1257   CREATININE 0.85 08/16/2018 1257   CALCIUM 9.2 08/16/2018 1257   GFRNONAA 81 08/16/2018 1257   GFRAA 94 08/16/2018 1257   Lab Results  Component Value Date   HGBA1C 5.6 08/16/2018   HGBA1C  5.5 05/02/2018   HGBA1C 6.1 10/21/2016   HGBA1C 5.4 01/08/2016   Lab Results  Component Value Date   INSULIN 6.5 08/16/2018   INSULIN 11.5 05/02/2018   CBC    Component Value Date/Time   WBC 6.9 05/02/2018 1108   WBC 6.4 10/21/2016 0805   RBC 4.89 05/02/2018 1108   RBC 4.72 10/21/2016 0805   HGB 14.4 05/02/2018 1108   HCT 43.7 05/02/2018 1108   PLT 227.0 10/21/2016 0805   MCV 89 05/02/2018 1108   MCH 29.4 05/02/2018 1108   MCH 29.3 06/28/2011 0520   MCHC 33.0 05/02/2018 1108   MCHC 32.3 10/21/2016 0805   RDW 12.9 05/02/2018 1108   LYMPHSABS 2.3 05/02/2018 1108   MONOABS 0.6 10/21/2016 0805   EOSABS 0.1 05/02/2018 1108   BASOSABS 0.0 05/02/2018 1108   Iron/TIBC/Ferritin/ %Sat    Component Value Date/Time   FERRITIN 5.3 (L) 10/21/2016 0805   Lipid Panel     Component Value Date/Time   CHOL 142 08/16/2018 1257   TRIG 52 08/16/2018 1257   HDL 49 08/16/2018 1257   CHOLHDL 3 10/21/2016 0805   VLDL 14.0 10/21/2016 0805   LDLCALC 83 08/16/2018 1257   Hepatic Function Panel     Component Value Date/Time   PROT 6.7 08/16/2018 1257   ALBUMIN 4.4 08/16/2018 1257   AST 13 08/16/2018 1257   ALT 14 08/16/2018 1257   ALKPHOS 75 08/16/2018 1257   BILITOT 0.4 08/16/2018 1257   BILIDIR 0.1 10/21/2016 0805      Component Value Date/Time   TSH 1.380 05/02/2018 1108   TSH 1.38 10/21/2016 0805   TSH 1.51  01/08/2016   TSH 1.58 12/30/2014 0804     Ref. Range 08/16/2018 12:57  Vitamin D, 25-Hydroxy Latest Ref Range: 30.0 - 100.0 ng/mL 59.3    I, Doreene Nest, am acting as Location manager for [employer]. Owens Shark, DO  I have reviewed the above documentation for accuracy and completeness, and I agree with the above. -Jearld Lesch, DO

## 2019-02-08 ENCOUNTER — Ambulatory Visit: Payer: 59

## 2019-02-08 ENCOUNTER — Other Ambulatory Visit: Payer: Self-pay

## 2019-02-08 DIAGNOSIS — G4733 Obstructive sleep apnea (adult) (pediatric): Secondary | ICD-10-CM

## 2019-02-13 ENCOUNTER — Telehealth: Payer: Self-pay

## 2019-02-13 DIAGNOSIS — G4733 Obstructive sleep apnea (adult) (pediatric): Secondary | ICD-10-CM

## 2019-02-13 NOTE — Telephone Encounter (Signed)
LMTCB x1 to make patient aware that sleep study shows mild sleep apnea and she is to continue her CPAP therapy use. Continuing weight loss measures are encouraged.

## 2019-02-14 NOTE — Telephone Encounter (Signed)
LMTCB x2  

## 2019-02-14 NOTE — Telephone Encounter (Signed)
Patient is returning phone call.  Patient phone number is [phone].

## 2019-02-14 NOTE — Telephone Encounter (Signed)
Mild sleep apnea with daytime symptoms should still be treated  If she feels she is not having any symptoms attributable to sleep apnea at all, then she may discontinue CPAP and just monitor symptoms

## 2019-02-14 NOTE — Telephone Encounter (Signed)
Spoke with pt. She is aware of Dr. Judson Roch response. States that she wants to speak to her husband and get back with [place]. Nothing further was needed at this time.

## 2019-02-14 NOTE — Telephone Encounter (Signed)
Spoke with pt. She is aware of her sleep study results. Pt is questioning why she needs to continue using her CPAP if her sleep apnea is only mild.  Dr. Ander Slade - please advise. Thanks!

## 2019-02-19 ENCOUNTER — Other Ambulatory Visit (INDEPENDENT_AMBULATORY_CARE_PROVIDER_SITE_OTHER): Payer: Self-pay | Admitting: Bariatrics

## 2019-02-19 DIAGNOSIS — F3289 Other specified depressive episodes: Secondary | ICD-10-CM

## 2019-03-06 ENCOUNTER — Other Ambulatory Visit: Payer: Self-pay

## 2019-03-06 ENCOUNTER — Encounter (INDEPENDENT_AMBULATORY_CARE_PROVIDER_SITE_OTHER): Payer: Self-pay | Admitting: Bariatrics

## 2019-03-06 ENCOUNTER — Telehealth (INDEPENDENT_AMBULATORY_CARE_PROVIDER_SITE_OTHER): Payer: 59 | Admitting: Bariatrics

## 2019-03-06 DIAGNOSIS — E669 Obesity, unspecified: Secondary | ICD-10-CM | POA: Diagnosis not present

## 2019-03-06 DIAGNOSIS — Z6834 Body mass index (BMI) 34.0-34.9, adult: Secondary | ICD-10-CM | POA: Diagnosis not present

## 2019-03-06 DIAGNOSIS — F3289 Other specified depressive episodes: Secondary | ICD-10-CM | POA: Diagnosis not present

## 2019-03-07 NOTE — Progress Notes (Signed)
Office: [phone]  /  Fax: [phone] TeleHealth Visit:  Alycia Leuenberger has verbally consented to this TeleHealth visit today. The patient is located at home, the provider is located at the [facility] office. The participants in this visit include the listed provider and patient. The visit was conducted today via FaceTime.  HPI:   Chief Complaint: OBESITY Sarahanne is here to discuss her progress with her obesity treatment plan. She states she is on her own plan and is following it 0% of the time. She states she is walking 10 miles 1 time per week.  Breella states that her weight remains the same. She states that "she is doing her own thing."  We were unable to weigh the patient today for this TeleHealth visit. She feels as if she has maintained her weight since her last visit. She has lost 30 lbs since starting treatment with [place].  Depression with emotional eating behaviors Damiana is struggling with emotional eating and using food for comfort to the extent that it is negatively impacting her health. She often snacks when she is not hungry. Tabathia sometimes feels she is out of control and then feels guilty that she made poor food choices. She has been working on behavior modification techniques to help reduce her emotional eating and has been somewhat successful. She shows no sign of suicidal or homicidal ideations.  Depression screen PHQ 2/9 05/02/2018  Decreased Interest 3  Down, Depressed, Hopeless 2  PHQ - 2 Score 5  Altered sleeping 3  Tired, decreased energy 3  Change in appetite 2  Feeling bad or failure about yourself  1  Trouble concentrating 1  Moving slowly or fidgety/restless 0  Suicidal thoughts 0  PHQ-9 Score 15  Difficult doing work/chores Somewhat difficult   ASSESSMENT AND PLAN:  Other depression  Class 1 obesity with serious comorbidity and body mass index (BMI) of 34.0 to 34.9 in adult, unspecified obesity type  PLAN:  Depression  with Emotional Eating Behaviors We discussed behavior modification techniques today to help Domini deal with her emotional eating and depression. Aella was advised to consider CBT strategies.  Obesity Detrice is currently in the action stage of change. As such, her goal is to continue with weight loss efforts. She has agreed to portion control better and make smarter food choices, such as increase vegetables and decrease simple carbohydrates.  Shamequa will work on meal planning and will consider more adherence to the plan. Kree has been instructed to continue walking 10 miles per week, walking 10,000 steps for weight loss and overall health benefits. We discussed the following Behavioral Modification Strategies today: increasing lean protein intake, decreasing simple carbohydrates, increasing vegetables, increase H20 intake, decrease eating out, no skipping meals, work on meal planning and easy cooking plans, keeping healthy foods in the home, and planning for success.  Dejia has agreed to follow-up with our clinic in 2 weeks. She was informed of the importance of frequent follow-up visits to maximize her success with intensive lifestyle modifications for her multiple health conditions.  ALLERGIES: No Known Allergies  MEDICATIONS: Current Outpatient Medications on File Prior to Visit  Medication Sig Dispense Refill  . buPROPion (WELLBUTRIN SR) 150 MG 12 hr tablet Take 1 tablet (150 mg total) by mouth daily. 90 tablet 0  . Prenatal Vit-Fe Fumarate-FA (MULTIVITAMIN-PRENATAL) 27-0.8 MG TABS tablet Take 1 tablet by mouth daily at 12 noon.    . sertraline (ZOLOFT) 25 MG tablet Take 25 mg by mouth at bedtime.    [name] Kitchen  VITAMIN D PO Take by mouth daily.     No current facility-administered medications on file prior to visit.     PAST MEDICAL HISTORY: Past Medical History:  Diagnosis Date  . Allergy   . AMA (advanced maternal age) multigravida 37+   . Anxiety   . Back pain   .  Depression   . Dyspnea   . Family history of blood clots 05/22/2013  . Fatigue   . Goiter, unspecified   . History of chicken pox   . Hx of varicella   . Joint pain    knees and ankles ache  . OSA on CPAP   . Other ankle sprain and strain    achilles rupture  . Poor posture   . Poor sleep   . Ruptured lumbar disc   . Snoring   . 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 ACHILLES TENDON REPAIR     Left softball injury Dr. [name]  . BACK SURGERY     hirsch  . SKIN GRAFT      SOCIAL HISTORY: Social History   Tobacco Use  . Smoking status: Never Smoker  . Smokeless tobacco: Never Used  Substance Use Topics  . Alcohol use: Yes    Comment: occasionally  . Drug use: No    FAMILY HISTORY: Family History  Problem Relation Age of Onset  . Cancer Mother        rare blood cancer  . Stroke Mother   . Heart failure Mother   . Hypertension Father   . Nephrolithiasis Father   . Diabetes Father   . COPD Father   . Cancer Father        kidney  . Hyperlipidemia Father   . Sudden death Father   . Sleep apnea Father   . Alcoholism Father   . Obesity Father   . Thyroid disease Maternal Aunt   . Arthritis Maternal Grandmother        rheumatoid  . Heart disease Maternal Grandfather   . Cancer Maternal Grandfather        stomach  . Cancer Paternal Grandfather        stomach ca   ROS: Review of Systems  Psychiatric/Behavioral: Positive for depression. Negative for suicidal ideas.       Negative for homicidal ideas.   PHYSICAL EXAM: Pt in no acute distress  RECENT LABS AND TESTS: BMET    Component Value Date/Time   NA 139 08/16/2018 1257   K 4.1 08/16/2018 1257   CL 102 08/16/2018 1257   CO2 22 08/16/2018 1257   GLUCOSE 97 08/16/2018 1257   GLUCOSE 106 (H) 10/21/2016 0805   BUN 13 08/16/2018 1257   CREATININE 0.85 08/16/2018 1257   CALCIUM 9.2 08/16/2018 1257   GFRNONAA 81  08/16/2018 1257   GFRAA 94 08/16/2018 1257   Lab Results  Component Value Date   HGBA1C 5.6 08/16/2018   HGBA1C 5.5 05/02/2018   HGBA1C 6.1 10/21/2016   HGBA1C 5.4 01/08/2016   Lab Results  Component Value Date   INSULIN 6.5 08/16/2018   INSULIN 11.5 05/02/2018   CBC    Component Value Date/Time   WBC 6.9 05/02/2018 1108   WBC 6.4 10/21/2016 0805   RBC 4.89 05/02/2018 1108   RBC 4.72 10/21/2016 0805   HGB 14.4 05/02/2018 1108   HCT 43.7 05/02/2018 1108   PLT 227.0 10/21/2016 0805  MCV 89 05/02/2018 1108   MCH 29.4 05/02/2018 1108   MCH 29.3 06/28/2011 0520   MCHC 33.0 05/02/2018 1108   MCHC 32.3 10/21/2016 0805   RDW 12.9 05/02/2018 1108   LYMPHSABS 2.3 05/02/2018 1108   MONOABS 0.6 10/21/2016 0805   EOSABS 0.1 05/02/2018 1108   BASOSABS 0.0 05/02/2018 1108   Iron/TIBC/Ferritin/ %Sat    Component Value Date/Time   FERRITIN 5.3 (L) 10/21/2016 0805   Lipid Panel     Component Value Date/Time   CHOL 142 08/16/2018 1257   TRIG 52 08/16/2018 1257   HDL 49 08/16/2018 1257   CHOLHDL 3 10/21/2016 0805   VLDL 14.0 10/21/2016 0805   LDLCALC 83 08/16/2018 1257   Hepatic Function Panel     Component Value Date/Time   PROT 6.7 08/16/2018 1257   ALBUMIN 4.4 08/16/2018 1257   AST 13 08/16/2018 1257   ALT 14 08/16/2018 1257   ALKPHOS 75 08/16/2018 1257   BILITOT 0.4 08/16/2018 1257   BILIDIR 0.1 10/21/2016 0805      Component Value Date/Time   TSH 1.380 05/02/2018 1108   TSH 1.38 10/21/2016 0805   TSH 1.51 01/08/2016   TSH 1.58 12/30/2014 0804   Results for NAA, CLAPSADDLE (MRN [id number]) as of 03/07/2019 10:19  Ref. Range 08/16/2018 12:57  Vitamin D, 25-Hydroxy Latest Ref Range: 30.0 - 100.0 ng/mL 59.3   I, Michaelene Song, am acting as Location manager for [employer], DO  I have reviewed the above documentation for accuracy and completeness, and I agree with the above. -Jearld Lesch, DO

## 2019-03-20 ENCOUNTER — Telehealth (INDEPENDENT_AMBULATORY_CARE_PROVIDER_SITE_OTHER): Payer: 59 | Admitting: Bariatrics

## 2019-03-20 ENCOUNTER — Encounter (INDEPENDENT_AMBULATORY_CARE_PROVIDER_SITE_OTHER): Payer: Self-pay

## 2019-04-05 LAB — HM MAMMOGRAPHY

## 2019-04-12 ENCOUNTER — Encounter: Payer: Self-pay | Admitting: Internal Medicine

## 2019-05-07 ENCOUNTER — Telehealth: Payer: Self-pay | Admitting: Pulmonary Disease

## 2019-05-07 DIAGNOSIS — G4733 Obstructive sleep apnea (adult) (pediatric): Secondary | ICD-10-CM

## 2019-05-07 NOTE — Telephone Encounter (Signed)
lmtcb for pt.  

## 2019-05-08 NOTE — Addendum Note (Signed)
Addended by: Nena Polio on: 05/08/2019 11:12 AM   Modules accepted: Orders

## 2019-05-08 NOTE — Telephone Encounter (Signed)
Spoke with pt, she states she wanted to discontinue her CPAP because she feels like it is helping her with her problems. She is still tired during the day and not sleeping well and doesn't think the CPAP is helping. Dr. Ander Slade can we send an order to discontinue CPAP? Please advise.   DME [facility]

## 2019-05-08 NOTE — Telephone Encounter (Signed)
Send an order to discontinue CPAP  Reason for discontinuation-patient request

## 2019-05-08 NOTE — Telephone Encounter (Signed)
Pt returning call.  [phone]

## 2019-05-08 NOTE — Telephone Encounter (Signed)
Order placed. Nothing further needed at this time. 

## 2020-10-20 ENCOUNTER — Other Ambulatory Visit: Payer: Self-pay

## 2020-10-20 NOTE — Progress Notes (Signed)
Chief Complaint  Patient presents with  . Irregular Heart Beat    Patient complains of irregular heartbeat, x3 weeks, Patient states she has not been exercising or exerting herself in any way.     HPI: Sharon Myers 50 y.o. come in for follow-up symptoms she has been in weight management for a couple years last visit with me was July 2019 for pain in knees. Having new symptoms and concerns so comes in to get checked out Lost 40 #  Last   Weight program  And then virtual and fell off the plan and gained the weight back . A month ago began having episodes where her heart  Going fast and strong kneeling it thumping in her chest and last for minutes  And subside  2 x per day and then randomly  Nor related to triggers.  No syncope  . Or dizzy spells    No sob  Except   for being out of shape.  ? If arms  Are a problem deviating symptoms down arms but not related to the elevated heart rate Sleep  Midnight  To 530 - 6  Taking naps in day.  Has osa  And subsided   And stopped with weight loss.  She now gets sleepy during the day. Concern about  Pre diabetc. Then tightness in calf and bruise in leg  And concerned .  She comes in for SDA visit today ROS: See pertinent positives and negatives per HPI. Mom passed  At 51 rare blood cancer .  Not sure   Sister had a cva  And  Father had blood clot  And died  And paternal uncle blood clot.  Wants goiter checked  On wellbutrin and  sertraline HH of 4 .  Past Medical History:  Diagnosis Date  . Allergy   . AMA (advanced maternal age) multigravida 61+   . Anxiety   . Back pain   . Depression   . Dyspnea   . Family history of blood clots 05/22/2013  . Fatigue   . Goiter, unspecified   . History of chicken pox   . Hx of varicella   . Joint pain    knees and ankles ache  . OSA on CPAP   . Other ankle sprain and strain    achilles rupture  . Poor posture   . Poor sleep   . Ruptured lumbar disc   . Snoring   . 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 Cancer Mother        rare blood cancer  . Stroke Mother   . Heart failure Mother   . Hypertension Father   . Nephrolithiasis Father   . Diabetes Father   . COPD Father   . Cancer Father        kidney  . Hyperlipidemia Father   . Sudden death Father   . Sleep apnea Father   . Alcoholism Father   . Obesity Father   . Thyroid disease Maternal Aunt   . Arthritis Maternal Grandmother        rheumatoid  . Heart disease Maternal Grandfather   . Cancer Maternal Grandfather        stomach  . Cancer Paternal Grandfather        stomach ca    Social History   Socioeconomic History  . Marital status: Married    Spouse name: Sharon Myers  .  Number of children: 2  . Years of education: Not on file  . Highest education level: Not on file  Occupational History  . Occupation: Work from home  Tobacco Use  . Smoking status: Never Smoker  . Smokeless tobacco: Never Used  Substance and Sexual Activity  . Alcohol use: Yes    Comment: occasionally  . Drug use: No  . Sexual activity: Yes    Comment: tubal if CS  Other Topics Concern  . Not on file  Social History Narrative   Usually receives 6 hours of sleep at night   4 people living in the home. No pets   Married 2 young children   Bachelors degree [employer]. 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 buPROPion (WELLBUTRIN SR) 150 MG 12 hr tablet Take 1 tablet (150 mg total) by mouth daily. 90 tablet 0  . Cetirizine HCl (ZYRTEC ALLERGY PO) Take by mouth.    .  Loratadine (CLARITIN PO) Take by mouth.    . Prenatal Vit-Fe Fumarate-FA (MULTIVITAMIN-PRENATAL) 27-0.8 MG TABS tablet Take 1 tablet by mouth daily at 12 noon.    . sertraline (ZOLOFT) 25 MG tablet Take 25 mg by mouth at bedtime.    [name] Kitchen VITAMIN D PO Take by mouth daily.     No facility-administered medications prior to visit.     EXAM:  BP 138/86 (BP Location: Left Arm, Patient Position: Sitting, Cuff Size: Large)   Pulse 74   Temp 98.2 F (36.8 C) (Oral)   Ht 5\' 9"  (1.753 m)   Wt 269 lb 3.2 oz (122.1 kg)   SpO2 96%   BMI 39.75 kg/m   Body mass index is 39.75 kg/m. Wt Readings from Last 3 Encounters:  10/21/20 269 lb 3.2 oz (122.1 kg)  12/27/18 230 lb (104.3 kg)  11/23/18 228 lb (103.4 kg)    GENERAL: vitals reviewed and listed above, alert, oriented, appears well hydrated and in no acute distress HEENT: atraumatic, conjunctiva  clear, no obvious abnormalities on inspection of external nose and ears OP : masked  NECK: no obvious masses on inspection  plapble thyroid no discrete  Nodule or tenderness LUNGS: clear to auscultation bilaterally, no wheezes, rales or rhonchi, good air movement CV: HRRR, no clubbing cyanosis or  peripheral edema nl cap refill  Abdomen:  Sof,t normal bowel sounds without hepatosplenomegaly, no guarding rebound or masses no CVA tenderness  MS: moves all extremities without noticeable focal  Abnormality right calf with linear bruise and mild tenderness with VV  Larger than left and tight  ( but has had achilles surgery on left  So may have mild atrophy) PSYCH: pleasant and cooperative, no obvious depression or anxiety Lab Results  Component Value Date   WBC 6.9 05/02/2018   HGB 14.4 05/02/2018   HCT 43.7 05/02/2018   PLT 227.0 10/21/2016   GLUCOSE 97 08/16/2018   CHOL 142 08/16/2018   TRIG 52 08/16/2018   HDL 49 08/16/2018   [place] 83 08/16/2018  ALT 14 08/16/2018   AST 13 08/16/2018   NA 139 08/16/2018   K 4.1 08/16/2018   CL 102  08/16/2018   CREATININE 0.85 08/16/2018   BUN 13 08/16/2018   CO2 22 08/16/2018   TSH 1.380 05/02/2018   HGBA1C 5.6 08/16/2018   BP Readings from Last 3 Encounters:  10/21/20 138/86  12/27/18 133/88  11/23/18 128/78  EKG NSR no acute findings  Last ekg 2019  Nl x lower voltage  ASSESSMENT AND PLAN:  Discussed the following assessment and plan:  Heart palpitations - Plan: Basic metabolic panel, CBC with Differential/Platelet, Hemoglobin A1c, Hepatic function panel, Lipid panel, TSH, T4, free, T3, free, EKG 12-Lead, Ambulatory referral to Cardiology  Localized swelling of right lower leg - Plan: Basic metabolic panel, CBC with Differential/Platelet, Hemoglobin A1c, Hepatic function panel, Lipid panel, TSH, T4, free, T3, free, VAS [place] LOWER EXTREMITY VENOUS (DVT)  OSA (obstructive sleep apnea) - Plan: Basic metabolic panel, CBC with Differential/Platelet, Hemoglobin A1c, Hepatic function panel, Lipid panel, TSH, T4, free, T3, free, Ambulatory referral to Cardiology  Class 1 obesity with serious comorbidity and body mass index (BMI) of 34.0 to 34.9 in adult, unspecified obesity type - Plan: Basic metabolic panel, CBC with Differential/Platelet, Hemoglobin A1c, Hepatic function panel, Lipid panel, TSH, T4, free, T3, free, Ambulatory referral to Cardiology  Goiter - Plan: Basic metabolic panel, CBC with Differential/Platelet, Hemoglobin A1c, Hepatic function panel, Lipid panel, TSH, T4, free, T3, free  Cardiovascular risk factor - Plan: Ambulatory referral to Cardiology Suspect benign process but  Consider afib  Svt  fam hx of vascular  Issue and she is concerned about heart health  And  blocked arteries .  Plan metabolic screen ,  Get back on cpap and get with pulmonary for  Sleepiness . Fasting labs  Refer to Cards for assessment   monitoring  Leg may be superficial process  She is concerned about  dvt  ( fam hx)  Check [place]   55 minutes review  eval and plan and counsel -Patient advised  to return or notify health care team  if  new concerns arise.  Patient Instructions  ekg is normal as well as exam except for goiter . Get fasting lab appt.to include lipids and thyroid  will do cardiology vascular assessment.  Treat the OSA and get back with pulmonary.   Do 2-3 things to get back on track in meantime .  Will be contacted about referral and echo .       Standley Brooking. Shlomo Seres M.D.

## 2020-10-21 ENCOUNTER — Ambulatory Visit: Payer: Managed Care, Other (non HMO) | Admitting: Internal Medicine

## 2020-10-21 ENCOUNTER — Encounter: Payer: Self-pay | Admitting: Internal Medicine

## 2020-10-21 VITALS — BP 138/86 | HR 74 | Temp 98.2°F | Ht 69.0 in | Wt 269.2 lb

## 2020-10-21 DIAGNOSIS — R2241 Localized swelling, mass and lump, right lower limb: Secondary | ICD-10-CM

## 2020-10-21 DIAGNOSIS — R002 Palpitations: Secondary | ICD-10-CM | POA: Diagnosis not present

## 2020-10-21 DIAGNOSIS — E669 Obesity, unspecified: Secondary | ICD-10-CM

## 2020-10-21 DIAGNOSIS — G4733 Obstructive sleep apnea (adult) (pediatric): Secondary | ICD-10-CM

## 2020-10-21 DIAGNOSIS — E049 Nontoxic goiter, unspecified: Secondary | ICD-10-CM

## 2020-10-21 DIAGNOSIS — Z9189 Other specified personal risk factors, not elsewhere classified: Secondary | ICD-10-CM

## 2020-10-21 DIAGNOSIS — Z6834 Body mass index (BMI) 34.0-34.9, adult: Secondary | ICD-10-CM

## 2020-10-21 NOTE — Patient Instructions (Signed)
ekg is normal as well as exam except for goiter . Get fasting lab appt.to include lipids and thyroid  will do cardiology vascular assessment.  Treat the OSA and get back with pulmonary.   Do 2-3 things to get back on track in meantime .  Will be contacted about referral and echo .

## 2020-10-22 ENCOUNTER — Ambulatory Visit (HOSPITAL_COMMUNITY)
Admission: RE | Admit: 2020-10-22 | Discharge: 2020-10-22 | Disposition: A | Discharge status: Home / Self Care | Payer: Managed Care, Other (non HMO) | Source: Ambulatory Visit | Attending: Internal Medicine | Admitting: Internal Medicine

## 2020-10-22 ENCOUNTER — Other Ambulatory Visit: Payer: Self-pay

## 2020-10-22 DIAGNOSIS — R2241 Localized swelling, mass and lump, right lower limb: Secondary | ICD-10-CM | POA: Diagnosis not present

## 2020-10-22 NOTE — Progress Notes (Signed)
Good news  no dvt  or significant abnormal findings

## 2020-10-23 ENCOUNTER — Other Ambulatory Visit: Payer: Managed Care, Other (non HMO)

## 2020-10-24 ENCOUNTER — Other Ambulatory Visit (INDEPENDENT_AMBULATORY_CARE_PROVIDER_SITE_OTHER): Payer: Managed Care, Other (non HMO)

## 2020-10-24 ENCOUNTER — Other Ambulatory Visit: Payer: Self-pay

## 2020-10-24 ENCOUNTER — Encounter: Payer: Self-pay | Admitting: Internal Medicine

## 2020-10-24 DIAGNOSIS — E049 Nontoxic goiter, unspecified: Secondary | ICD-10-CM

## 2020-10-24 DIAGNOSIS — R002 Palpitations: Secondary | ICD-10-CM | POA: Diagnosis not present

## 2020-10-24 DIAGNOSIS — G4733 Obstructive sleep apnea (adult) (pediatric): Secondary | ICD-10-CM

## 2020-10-24 DIAGNOSIS — R2241 Localized swelling, mass and lump, right lower limb: Secondary | ICD-10-CM

## 2020-10-24 DIAGNOSIS — E669 Obesity, unspecified: Secondary | ICD-10-CM | POA: Diagnosis not present

## 2020-10-24 DIAGNOSIS — Z6834 Body mass index (BMI) 34.0-34.9, adult: Secondary | ICD-10-CM

## 2020-10-24 LAB — BASIC METABOLIC PANEL
BUN: 10 mg/dL (ref 6–23)
CO2: 29 mEq/L (ref 19–32)
Calcium: 9.1 mg/dL (ref 8.4–10.5)
Chloride: 105 mEq/L (ref 96–112)
Creatinine, Ser: 0.84 mg/dL (ref 0.40–1.20)
GFR: 81.11 mL/min (ref >60.00)
Glucose, Bld: 103 mg/dL — ABNORMAL HIGH (ref 70–99)
Potassium: 4.2 mEq/L (ref 3.5–5.1)
Sodium: 142 mEq/L (ref 135–145)

## 2020-10-24 LAB — CBC WITH DIFFERENTIAL/PLATELET
Basophils Absolute: 0 10*3/uL (ref 0.0–0.1)
Basophils Relative: 0.5 % (ref 0.0–3.0)
Eosinophils Absolute: 0.2 10*3/uL (ref 0.0–0.7)
Eosinophils Relative: 2.3 % (ref 0.0–5.0)
HCT: 41.2 % (ref 36.0–46.0)
Hemoglobin: 14 g/dL (ref 12.0–15.0)
Lymphocytes Relative: 32.6 % (ref 12.0–46.0)
Lymphs Abs: 2.5 10*3/uL (ref 0.7–4.0)
MCHC: 34.1 g/dL (ref 30.0–36.0)
MCV: 86.7 fl (ref 78.0–100.0)
Monocytes Absolute: 0.6 10*3/uL (ref 0.1–1.0)
Monocytes Relative: 8.4 % (ref 3.0–12.0)
Neutro Abs: 4.3 10*3/uL (ref 1.4–7.7)
Neutrophils Relative %: 56.2 % (ref 43.0–77.0)
Platelets: 202 10*3/uL (ref 150.0–400.0)
RBC: 4.75 Mil/uL (ref 3.87–5.11)
RDW: 13.2 % (ref 11.5–15.5)
WBC: 7.6 10*3/uL (ref 4.0–10.5)

## 2020-10-24 LAB — LIPID PANEL
Cholesterol: 173 mg/dL (ref 0–200)
HDL: 54.9 mg/dL (ref >39.00)
LDL Cholesterol: 101 mg/dL — ABNORMAL HIGH (ref 0–99)
NonHDL: 118.56
Total CHOL/HDL Ratio: 3
Triglycerides: 86 mg/dL (ref 0.0–149.0)
VLDL: 17.2 mg/dL (ref 0.0–40.0)

## 2020-10-24 LAB — HEPATIC FUNCTION PANEL
ALT: 19 U/L (ref 0–35)
AST: 17 U/L (ref 0–37)
Albumin: 4.1 g/dL (ref 3.5–5.2)
Alkaline Phosphatase: 78 U/L (ref 39–117)
Bilirubin, Direct: 0.1 mg/dL (ref 0.0–0.3)
Total Bilirubin: 0.6 mg/dL (ref 0.2–1.2)
Total Protein: 6.6 g/dL (ref 6.0–8.3)

## 2020-10-24 LAB — HEMOGLOBIN A1C: Hgb A1c MFr Bld: 5.9 % (ref 4.6–6.5)

## 2020-10-24 LAB — T4, FREE: Free T4: 0.87 ng/dL (ref 0.60–1.60)

## 2020-10-24 LAB — TSH: TSH: 1.86 u[IU]/mL (ref 0.35–4.50)

## 2020-10-24 LAB — T3, FREE: T3, Free: 3.3 pg/mL (ref 2.3–4.2)

## 2020-11-10 NOTE — Progress Notes (Signed)
Thyroid  blood count kidney liver stable.  No diabetes.

## 2021-01-08 ENCOUNTER — Ambulatory Visit (INDEPENDENT_AMBULATORY_CARE_PROVIDER_SITE_OTHER): Payer: Managed Care, Other (non HMO)

## 2021-01-08 ENCOUNTER — Encounter: Payer: Self-pay | Admitting: Interventional Cardiology

## 2021-01-08 ENCOUNTER — Other Ambulatory Visit: Payer: Self-pay

## 2021-01-08 ENCOUNTER — Ambulatory Visit: Payer: Managed Care, Other (non HMO) | Admitting: Interventional Cardiology

## 2021-01-08 VITALS — BP 128/80 | HR 67 | Ht 69.0 in | Wt 271.2 lb

## 2021-01-08 DIAGNOSIS — E669 Obesity, unspecified: Secondary | ICD-10-CM | POA: Diagnosis not present

## 2021-01-08 DIAGNOSIS — R002 Palpitations: Secondary | ICD-10-CM

## 2021-01-08 DIAGNOSIS — Z8249 Family history of ischemic heart disease and other diseases of the circulatory system: Secondary | ICD-10-CM | POA: Diagnosis not present

## 2021-01-08 NOTE — Progress Notes (Unsigned)
Patient enrolled for [employer] to mail a 14 day ZIO XT monitor to address on file. 

## 2021-01-08 NOTE — Patient Instructions (Signed)
Medication Instructions:  Your physician recommends that you continue on your current medications as directed. Please refer to the Current Medication list given to you today.  *If you need a refill on your cardiac medications before your next appointment, please call your pharmacy*   Lab Work: none If you have labs (blood work) drawn today and your tests are completely normal, you will receive your results only by: [place] (if you have MyChart) OR A paper copy in the mail If you have any lab test that is abnormal or we need to change your treatment, we will call you to review the results.   Testing/Procedures: Dr Irish Lack recommends you have a Calcium Score CT Scan  Your physician has recommended that you wear a holter monitor. Holter monitors are medical devices that record the heart's electrical activity. Doctors most often use these monitors to diagnose arrhythmias. Arrhythmias are problems with the speed or rhythm of the heartbeat. The monitor is a small, portable device. You can wear one while you do your normal daily activities. This is usually used to diagnose what is causing palpitations/syncope (passing out). 2 week zio   Follow-Up: At [facility], you and your health needs are our priority.  As part of our continuing mission to provide you with exceptional heart care, we have created designated Provider Care Teams.  These Care Teams include your primary Cardiologist (physician) and Advanced Practice Providers (APPs -  Physician Assistants and Nurse Practitioners) who all work together to provide you with the care you need, when you need it.  We recommend signing up for the patient portal called "MyChart".  Sign up information is provided on this After Visit Summary.  MyChart is used to connect with patients for Virtual Visits (Telemedicine).  Patients are able to view lab/test results, encounter notes, upcoming appointments, etc.  Non-urgent messages can be sent to your  provider as well.   To learn more about what you can do with MyChart, go to [email or web address].    Your next appointment:   Based on test results  The format for your next appointment:   In Person  Provider:   You may see Larae Grooms, MD or one of the following Advanced Practice Providers on your designated Care Team:   Melina Copa, PA-C Ermalinda Barrios, PA-C   Other Instructions Bryn Gulling- Long Term Monitor Instructions  Your physician has requested you wear a ZIO patch monitor for 14 days.  This is a single patch monitor. [employer] supplies one patch monitor per enrollment. Additional stickers are not available. Please do not apply patch if you will be having a Nuclear Stress Test,  Echocardiogram, Cardiac CT, MRI, or Chest Xray during the period you would be wearing the  monitor. The patch cannot be worn during these tests. You cannot remove and re-apply the  ZIO XT patch monitor.  Your ZIO patch monitor will be mailed 3 day USPS to your address on file. It may take 3-5 days  to receive your monitor after you have been enrolled.  Once you have received your monitor, please review the enclosed instructions. Your monitor  has already been registered assigning a specific monitor serial # to you.  Billing and Patient Assistance Program Information  We have supplied [employer] with any of your insurance information on file for billing purposes. [employer] offers a sliding scale Patient Assistance Program for patients that do not have  insurance, or whose insurance does not completely cover the cost of the ZIO monitor.  You must apply for the Patient Assistance Program to qualify for this discounted rate.  To apply, please call [employer] at [phone], select option 4, select option 2, ask to apply for  Patient Assistance Program. Theodore Demark will ask your household income, and how many people  are in your household. They will quote your out-of-pocket cost based on that information.   [employer] will also be able to set up a 20-month interest-free payment plan if needed.  Applying the monitor   Shave hair from upper left chest.  Hold abrader disc by orange tab. Rub abrader in 40 strokes over the upper left chest as  indicated in your monitor instructions.  Clean area with 4 enclosed alcohol pads. Let dry.  Apply patch as indicated in monitor instructions. Patch will be placed under collarbone on left  side of chest with arrow pointing upward.  Rub patch adhesive wings for 2 minutes. Remove white label marked "1". Remove the white  label marked "2". Rub patch adhesive wings for 2 additional minutes.  While looking in a mirror, press and release button in center of patch. A small green light will  flash 3-4 times. This will be your only indicator that the monitor has been turned on.  Do not shower for the first 24 hours. You may shower after the first 24 hours.  Press the button if you feel a symptom. You will hear a small click. Record Date, Time and  Symptom in the Patient Logbook.  When you are ready to remove the patch, follow instructions on the last 2 pages of Patient  Logbook. Stick patch monitor onto the last page of Patient Logbook.  Place Patient Logbook in the blue and white box. Use locking tab on box and tape box closed  securely. The blue and white box has prepaid postage on it. Please place it in the mailbox as  soon as possible. Your physician should have your test results approximately 7 days after the  monitor has been [facility]  Call [place] [phone] you have questions regarding  your ZIO XT patch monitor. Call them immediately if you see an orange light blinking on your  monitor.  If your monitor falls off in less than 4 days, contact our Monitor department at [phone]  If your monitor becomes loose or falls off after 4 days call [employer] at [phone]  suggestions on securing your  monitor

## 2021-01-08 NOTE — Progress Notes (Signed)
Cardiology Office Note   Date:  01/08/2021   ID:  Sharon Myers, DOB [birth year], MRN [id number]  PCP:  Burnis Medin, MD    No chief complaint on file.  palpitations  Wt Readings from Last 3 Encounters:  01/08/21 271 lb 3.2 oz (123 kg)  10/21/20 269 lb 3.2 oz (122.1 kg)  12/27/18 230 lb (104.3 kg)       History of Present Illness: Sharon Myers is a 50 y.o. female who is being seen today for the evaluation of palpitations at the request of Panosh, Standley Brooking, MD.  Records from 10/2020 show: "A month ago began having episodes where her heart  Going fast and strong kneeling it thumping in her chest and last for minutes  And subside  2 x per day and then randomly  Nor related to triggers.  No syncope  . Or dizzy spells    No sob  Except   for being out of shape. ? If arms  Are a problem deviating symptoms down arms but not related to the elevated heart rate Sleep  Midnight  To 530 - 6  Taking naps in day. Has osa  And subsided   And stopped with weight loss.  She now gets sleepy during the day."  Sx have decreased since then.  She does drink soda and cut back on these.  Palpitations did not change.  Now down to 1 episode per week.  Lasted 3-5 minutes.    Denies : Chest pain. Leg edema. Nitroglycerin use. Orthopnea. Paroxysmal nocturnal dyspnea. Shortness of breath. Syncope.    No early CAD in family.  Had [place] in 2021.    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 [name]   BACK SURGERY     hirsch   SKIN GRAFT       Current Outpatient Medications  Medication Sig Dispense Refill   buPROPion (WELLBUTRIN SR) 150 MG 12 hr tablet Take 1 tablet (150 mg total) by mouth daily. 90 tablet 0   Cetirizine HCl (ZYRTEC ALLERGY PO) Take by mouth.     Loratadine (CLARITIN PO) Take by mouth.     Prenatal Vit-Fe Fumarate-FA (MULTIVITAMIN-PRENATAL) 27-0.8 MG TABS tablet Take 1 tablet by mouth daily at 12 noon.     sertraline (ZOLOFT) 25 MG tablet Take 25 mg by mouth at bedtime.     VITAMIN D PO Take by mouth daily.     No current facility-administered medications for this visit.    Allergies:   Patient has no known allergies.    Social History:  The patient  reports that she has never smoked. She has never used smokeless tobacco. She reports  current alcohol use. She reports that she does not use drugs.   Family History:  The patient's family history includes Alcoholism in her father; Arthritis in her maternal grandmother; COPD in her father; Cancer in her father, maternal grandfather, mother, and paternal grandfather; Diabetes in her father; Heart disease in her maternal grandfather; Heart failure in her mother; Hyperlipidemia in her father; Hypertension in her father; Nephrolithiasis in her father; Obesity in her father; Sleep apnea in her father; Stroke in her mother; Sudden death in her father; Thyroid disease in her maternal aunt.    ROS:  Please see the history of present illness.   Otherwise, review of systems are positive for intermittent compliance with CPAP.   All other systems are reviewed and negative.    PHYSICAL EXAM: VS:  BP 128/80   Pulse 67   Ht '5\' 9"'$  (1.753 m)   Wt 271 lb 3.2 oz (123 kg)   SpO2 98%   BMI 40.05 kg/m  , BMI Body mass index is 40.05 kg/m. GEN: Well nourished, well developed, in no acute distress HEENT: normal Neck: no JVD, carotid bruits, or masses Cardiac: RRR; no  murmurs, rubs, or gallops,no edema  Respiratory:  clear to auscultation bilaterally, normal work of breathing GI: soft, nontender, nondistended, + BS MS: no deformity or atrophy; 2+ DP pulses Skin: warm and dry, no rash Neuro:  Strength and sensation are intact Psych: euthymic mood, full affect   EKG:   The ekg ordered today demonstrates NSR, no ST changes   Recent Labs: 10/24/2020: ALT 19; BUN 10; Creatinine, Ser 0.84; Hemoglobin 14.0; Platelets 202.0; Potassium 4.2; Sodium 142; TSH 1.86   Lipid Panel    Component Value Date/Time   CHOL 173 10/24/2020 0821   CHOL 142 08/16/2018 1257   TRIG 86.0 10/24/2020 0821   HDL 54.90 10/24/2020 0821   HDL 49 08/16/2018 1257   CHOLHDL 3 10/24/2020 0821   VLDL 17.2 10/24/2020 0821   LDLCALC 101 (H) 10/24/2020 0821   LDLCALC 83 08/16/2018 1257     Other studies Reviewed: Additional studies/ records that were reviewed today with results demonstrating: labs and ECG reviewed.   ASSESSMENT AND PLAN:  Palpitations: Episodes are decreasing in frequency but have not resolved completely.  Will plan for 2-week Zio patch to monitor for arrhythmia.  No syncope.  Normal cardiac exam.  Episodes do not sound like they are coming from a life-threatening arrhythmia. She is concerned about CAD.  Plan for calcium scoring CT. We talked about healthy lifestyle changes such as a more plant-based diet and increasing her exercise to the target below. LDL increased to 101   Current medicines are reviewed at length with the patient today.  The patient concerns regarding her medicines were addressed.  The following changes have been made:  No change  Labs/ tests ordered today include:  No orders of the defined types were placed in this encounter.   Recommend 150 minutes/week of aerobic exercise Low fat, low carb, high fiber diet recommended  Disposition:   FU based on results   Signed, Larae Grooms, MD  01/08/2021 10:22 AM    [facility] [place], [place], [place]  [address] Phone: [phone]; Fax: [phone]

## 2021-01-22 DIAGNOSIS — R002 Palpitations: Secondary | ICD-10-CM | POA: Diagnosis not present

## 2021-01-23 ENCOUNTER — Telehealth: Payer: Self-pay | Admitting: Interventional Cardiology

## 2021-01-23 NOTE — Telephone Encounter (Signed)
Left detailed message (DPR). Okay to take off if it is a must, we can still process the wear time that she has worn it. Ideally the full prescribed time is best. Please call with any questions.

## 2021-01-23 NOTE — Telephone Encounter (Signed)
  1. Is this related to a heart monitor you are wearing?  (If the patient says no, please ask     if they are caling about ICD/pacemaker.) yes   2. What is your issue??If the patient is calling for results of the heart monitor this     message should be sent to nurse.)pt is calling to find out if she can take the monitor off sooner due to her going on vacation   Please route to covering RN/CMA/RMA for results. Route to monitor technicians or your monitor tech representative for your site for any technical concerns

## 2021-02-09 ENCOUNTER — Other Ambulatory Visit: Payer: Self-pay

## 2021-02-09 ENCOUNTER — Ambulatory Visit (INDEPENDENT_AMBULATORY_CARE_PROVIDER_SITE_OTHER)
Admission: RE | Admit: 2021-02-09 | Discharge: 2021-02-09 | Disposition: A | Discharge status: Home / Self Care | Payer: Self-pay | Source: Ambulatory Visit | Attending: Interventional Cardiology | Admitting: Interventional Cardiology

## 2021-02-09 DIAGNOSIS — R002 Palpitations: Secondary | ICD-10-CM

## 2021-03-03 NOTE — Telephone Encounter (Signed)
Glad nothing important was involved with your symptoms.  Should update your your routine screenings. Colon cancer screening  may be a tetanus shot if you are interested I suggest make regular physical preventive care appointment to go over those routine issues when convenient.

## 2021-03-05 ENCOUNTER — Encounter: Payer: Self-pay | Admitting: Internal Medicine

## 2021-03-05 ENCOUNTER — Telehealth (INDEPENDENT_AMBULATORY_CARE_PROVIDER_SITE_OTHER): Payer: Managed Care, Other (non HMO) | Admitting: Internal Medicine

## 2021-03-05 VITALS — Ht 69.0 in | Wt 271.2 lb

## 2021-03-05 DIAGNOSIS — F439 Reaction to severe stress, unspecified: Secondary | ICD-10-CM | POA: Diagnosis not present

## 2021-03-05 DIAGNOSIS — R41840 Attention and concentration deficit: Secondary | ICD-10-CM

## 2021-03-05 DIAGNOSIS — G4733 Obstructive sleep apnea (adult) (pediatric): Secondary | ICD-10-CM

## 2021-03-05 DIAGNOSIS — G479 Sleep disorder, unspecified: Secondary | ICD-10-CM | POA: Diagnosis not present

## 2021-03-05 NOTE — Progress Notes (Signed)
Virtual Visit via Video Note  I connected withNAME@ on 03/05/21 at  9:30 AM EDT by a video enabled telemedicine application and verified that I am speaking with the correct person using two identifiers. Location patient: home Location provider:home office Persons participating in the virtual visit: patient, provider  [place] national recommendations  regarding COVID 19 pandemic   video visit is advised over in office visit for this patient.  Patient aware  of the limitations of evaluation and management by telemedicine and  availability of in person appointments. and agreed to proceed.   HPI: Sharon Myers presents for video visit because of concern most recently of difficulty concentrating that is affecting her work. She does work from her but screen intense. Remotely she was evaluated for some attentional symptoms maybe in 2015 with questionnaire was noted to have sleep apnea which since her weight loss has not been a problem.  Has irregular periods but over the last 18 months has had drenching night sweats that sometimes wakes her up. No new medicines has lost weight under weight management but gained some back after COVID shutdown. He recently had an evaluation for palpitations and cardiovascular assessment that has shown no concerns about cardiovascular disease. States she does have some anxiety worried about her daughters conditions. Daughter 6 grade possible anxiety and attentional problems and a high academic tract medical evaluations to through the pediatrician. As a mom she has concerns and does add to stress and concentration.and anxiety.  Denies depression ROS: See pertinent positives and negatives per HPI.  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 Kirby Funk SURGERY     hirsch   SKIN GRAFT      Family History  Problem Relation Age of Onset   Cancer Mother        rare blood cancer   Stroke Mother    Heart failure Mother    Hypertension Father    Nephrolithiasis Father    Diabetes Father    COPD Father    Cancer Father        kidney   Hyperlipidemia Father    Sudden death Father    Sleep apnea Father    Alcoholism Father    Obesity Father    Thyroid disease Maternal Aunt    Arthritis Maternal Grandmother        rheumatoid   Heart disease Maternal Grandfather    Cancer Maternal Grandfather        stomach   Cancer Paternal Grandfather        stomach ca    Social History   Tobacco Use   Smoking status: Never   Smokeless tobacco: Never  Substance Use  Topics   Alcohol use: Yes    Comment: occasionally   Drug use: No      Current Outpatient Medications:    buPROPion (WELLBUTRIN SR) 150 MG 12 hr tablet, Take 1 tablet (150 mg total) by mouth daily., Disp: 90 tablet, Rfl: 0   Cetirizine HCl (ZYRTEC ALLERGY PO), Take by mouth., Disp: , Rfl:    Loratadine (CLARITIN PO), Take by mouth., Disp: , Rfl:    sertraline (ZOLOFT) 25 MG tablet, Take 25 mg by mouth at bedtime., Disp: , Rfl:    Prenatal Vit-Fe Fumarate-FA (MULTIVITAMIN-PRENATAL) 27-0.8 MG TABS tablet, Take 1 tablet by mouth daily at 12 noon., Disp: , Rfl:    VITAMIN D PO, Take by mouth daily., Disp: , Rfl:   EXAM: BP Readings from Last 3 Encounters:  01/08/21 128/80  10/21/20 138/86  12/27/18 133/88    VITALS per patient if applicable:  GENERAL: alert, oriented, appears well and in no acute  distress  HEENT: atraumatic, conjunttiva clear, no obvious abnormalities on inspection of external nose and ears  NECK: normal movements of the head and neck  LUNGS: on inspection no signs of respiratory distress, breathing rate appears normal, no obvious gross SOB, gasping or wheezing  CV: no obvious cyanosis  MS: moves all visible extremities without noticeable abnormality  PSYCH/NEURO: pleasant and cooperative, no obvious depression or anxiety, speech and thought processing grossly intact Lab Results  Component Value Date   WBC 7.6 10/24/2020   HGB 14.0 10/24/2020   HCT 41.2 10/24/2020   PLT 202.0 10/24/2020   GLUCOSE 103 (H) 10/24/2020   CHOL 173 10/24/2020   TRIG 86.0 10/24/2020   HDL 54.90 10/24/2020   LDLCALC 101 (H) 10/24/2020   ALT 19 10/24/2020   AST 17 10/24/2020   NA 142 10/24/2020   K 4.2 10/24/2020   CL 105 10/24/2020   CREATININE 0.84 10/24/2020   BUN 10 10/24/2020   CO2 29 10/24/2020   TSH 1.86 10/24/2020   HGBA1C 5.9 10/24/2020    ASSESSMENT AND PLAN:  Discussed the following assessment and plan:    ICD-10-CM   1. Difficulty concentrating  R41.840 Ambulatory referral to Psychology    2. Sleep disturbance ? perimenopause  G47.9    wakes up sweating  poss homonal can effect day  mood etc get with gyne for assesment poss intervention     3. Stress and anxiety  F43.9    concern about daughter  poss effecting her concentration    4. OSA (obstructive sleep apnea)  G47.33    got better with weight loss   no current cpap     Attentional difficulties over the last year with some perimenopausal symptoms night sweating awakening at possibly related. Appears that history of sleep apnea is not primary since she lost weight. Concern and worry about her daughter's condition certainly could be affecting has become elevated pandemic.  That could be underlying days diagnosis of ADD still possible. Consideration of treating reactive anxiety and stress in  addition. Referral for psychology evaluation attentional difficulties ;have her speak with GYN about night  sweating. As contributor to recnet concentration difficulties  Plan follow-up after evaluation or as indicated in interim Review lab tests are normal. Consideration of medication depending on predominant symptom complex. ? Ssri ?+- adhd med?  Counseled.   Expectant management and discussion of plan and treatment with opportunity to ask questions and all were answered. The patient agreed with the plan and demonstrated an understanding of the instructions.  Advised to call back or seek an in-person evaluation if worsening  or having  further concerns . Return for after evaluations or if worsening .   [name], MD

## 2021-05-13 ENCOUNTER — Ambulatory Visit (INDEPENDENT_AMBULATORY_CARE_PROVIDER_SITE_OTHER): Payer: 59 | Admitting: Psychology

## 2021-05-13 DIAGNOSIS — F4321 Adjustment disorder with depressed mood: Secondary | ICD-10-CM

## 2021-06-09 ENCOUNTER — Ambulatory Visit (INDEPENDENT_AMBULATORY_CARE_PROVIDER_SITE_OTHER): Payer: Self-pay | Admitting: Psychology

## 2021-06-09 DIAGNOSIS — F4321 Adjustment disorder with depressed mood: Secondary | ICD-10-CM

## 2021-06-09 NOTE — Progress Notes (Signed)
[facility] Progress Note  Patient ID: Sharon Myers                       MRN: [id number]  Date: 06/09/2021  Time Spent: 12:00 - 2:00pm   Treatment Type: Testing  Met with patient for testing session. Patient was at home due to COVID-19 restrictions and session was conducted from therapist's office via video conferencing.  Patient verbally consented to telehealth.  Reported Symptoms: Reason for Visit /Presenting Problem: SPatient presents with inattention distractibility, frequent losing/forgetting, inconsistent organization, and impulsive behavior.  She was unsure when attention deficits started but noticed difficulty with reading recall as early as 3rd grade.  Issues with depression and social isolation were noted during high school but was very outgoing prior to then.  social isolating has increased with working remotely.  Patient has noticed increased difficulty at work over the past few years as her job has become less structured and stimulating.  Testing recommended to evaluate for ADHD along with other factors and conditions that may be influencing attention. See intake note.    Mental Status Exam: Appearance:  Casual and Fairly Well Groomed     Behavior: Appropriate  Motor: Normal  Speech/Language:  Clear and Coherent and Normal Rate  Affect: Appropriate  Mood: normal  Thought process: Normal  Thought content:   WNL  Sensory/Perceptual disturbances:   WNL  Orientation: oriented to person, place, time/date, and situation  Attention: Good  Concentration: Fair  Memory: WNL  Fund of knowledge:  Good  Insight:   Good  Judgment:  Good  Impulse Control: Good   Risk Assessment: Danger to Self:  No Self-injurious Behavior: No Danger to Others: No  Behavior Observations: Patient was cooperative and displayed good effort. Attention and performance were inconsistent overall, as patient exhibited several instances of self-correction and asking questions to  be repeated.  She occasionally solved problems correctly after the standardized time limit and missed several relatively easy problems or questions.  Mood was euthymic with appropriate affect.  The results appear representative of current functioning.    Subjective: Testing included the WAIS-IV (2.0 hrs. for testing and scoring) along with the CNS Vital signs and BRIEF-A, which were completed online prior to the session.     Diagnosis: Adjustment Disorder with Depressed mood.    Plan: Testing complete. Report writing to be conducted followed by  interactive feedback next session.    Rainey Pines, PhD

## 2021-06-10 ENCOUNTER — Encounter: Payer: Self-pay | Admitting: Psychology

## 2021-06-10 NOTE — Progress Notes (Signed)
Report writing competed ( 2 hrs.).  Interactive feedback to be conducted next session. Report to be attached to the feedback progress note.   Rainey Pines, Ph.D.

## 2021-06-26 ENCOUNTER — Ambulatory Visit (INDEPENDENT_AMBULATORY_CARE_PROVIDER_SITE_OTHER): Payer: 59 | Admitting: Psychology

## 2021-06-26 ENCOUNTER — Encounter: Payer: Self-pay | Admitting: Psychology

## 2021-06-26 DIAGNOSIS — F900 Attention-deficit hyperactivity disorder, predominantly inattentive type: Secondary | ICD-10-CM

## 2021-06-26 DIAGNOSIS — F330 Major depressive disorder, recurrent, mild: Secondary | ICD-10-CM

## 2021-06-26 NOTE — Progress Notes (Signed)
                Chanie Soucek, PhD 

## 2021-06-26 NOTE — Progress Notes (Signed)
Sharon Myers is a 51 y.o. female patient. Psychological report is attached.  Psychological Testing Report - Confidential  Identifying Information:               Patient's Name:   Sharon Myers  Date of Birth:   03-23-1971     Age:                34 years  MRN#:                                   [id number]      Dates of Assessment:  June 09, 2021         Purpose of Evaluation:  The purpose of the evaluation is to provide diagnostic information and treatment recommendations.     Referral Information: Sharon Myers was a 51 year old Caucasian female.  She was referred to [facility] for psychological testing by [name], MD related to suspicion of Attention Deficit Hyperactivity Disorder (ADHD).  Sharon Myers main concern is that she is unable to get work done.  Her focus is poor, and she hops from one activity to another.  She has trouble staying present enough to focus on the task for that day.  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           Relevant Background Information:  Developmental history was reported to be recent regression in abilities.  Delays in early milestones were denied.  Sharon Myers reported having well developed gross motor skills, as she was an athlete while in college.  Regarding fine motor development, handwriting was reported to be typical, although it has become more illegible over past couple of years.  She used to change her writing style every couple of years, but it had always been neat.  Typical development with other fine motor skills was indicated.  Regarding speech, Sharon Myers reported having no difficulty pronouncing words but struggling to think of the correct words for the moment, having a 1-2 second delay and sometimes saying something differently from what she intended to say.  This has been occurring for the past 18 months.   Sharon Myers reported  performing well with self-care.  There have been no changes in this area and she still dislikes doing household chores.  Socially, Sharon Myers indicated being very outgoing throughout high school and college.  She had many friends until she started working from home about 13-14 years ago.  Now Sharon Myers has little interest in interacting with others outside of the home.  She still has some close friends but is no longer extraverted.              Medical history was reported to be significant for being overweight.  Sharon Myers denied allergies, digestion problems, concussions, seizures, or head injuries.  Current medications include Zoloft for anxiety and depression.  Sharon Myers has been taking this medication for 20 years.  Sharon Myers indicated that she was getting emotionally overwhelmed at that time as her mother had passed, she bought a home, changed jobs, and had an abortion.  Hospitalization/surgery was significant for a ruptured Achilles tendon and disc in her back.  She needed plastic surgery on her hands as a child.  Sharon Myers has not received previous psychological testing.  She attended counseling briefly during the  period she was first depressed but didn't connect well with the therapist. Substance abuse was denied, although Sharon Myers indicated that she drinks 1-2 beers per week and partakes in occasional THC gummy use.                          Educationally, Sharon Myers reported that she earned her bachelor's degree in Business rnot taking her coursework seriously.  She typically achieved B and C grades.  Sharon Myers stated that she completed her work but never put in the extra effort to do well on tests.  She struggled with lack of focus during high school, having to reread passages repeatedly.  She lacked study skills, engaging in frequent procrastination and work avoidance.  Sharon Myers experienced some isolation and depression during that time as well and spent a lot of time in her bedroom.  Ms.  Myers reported having trouble with reading attention and recall since the 3rd grade.  She read often but had poor recall skills.  Sharon Myers did not receive any educational accommodations or display any behavior concerns during school.  Strengths include math.  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  Sharon Myers reported that she could focus and enjoyed working downtown but disliked the work.  Sharon Myers reported having trouble focusing with her current job.  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  Since the company hired more staff, Sharon Myers is in different role which has been less stimulating for her.  Leisure activities include being outside, physical labor, gardening, being social, shopping, and going out with friends.      Sharon Myers is currently living with her husband and two children, ages 75 and 44 years.  She has been married for 16 years and indicated having good relations with husband and children.  Sharon Myers reported being closest to her husband and close friends.  Family mental health history was reported to be unremarkable.  Childhood history was reported to be good.  Sharon Myers grew up in an intact family in [place] with her brother.  She reported enjoying her childhood.  She moved to [place] after high school to attend college in [place] and has stayed in [place] since.  Recent changes or triggers were denied.            Presenting Symptomology:  Sharon Myers reported that she sleeps well averaging 6-7 hours of sleep per night.  She can fall asleep easily but has low energy and takes naps during the day.  Recent changes in appetite were denied.  Sharon Myers denied current sadness or  depression as well as any thoughts of self-harm.  She denied having any panic attacks since she was pregnant with her first child.  Specific worries or fears were denied other than her supervisor finding out about her productivity issues.  She does not experience in general worry or social anxiety.  Obsessive thought or compulsive behavior were also denied, although she used to always must open doors with her left hand and her stuffed animals had to be arranged in a specific way.  She has not experienced anything like this since childhood other than  arranging clothing in her closet by color.  She has a strong urge to organize with everything having a place and structure.  Her current work activities are not structured and well defined, which is causing problems for her.  Ms. Watts reported having trouble paying attention.  She doesn't remember when this started but indicated that she used to doodle during class and had trouble with reading recall beginning in 3rd grade.  She becomes easily distracted and must write everything down or she will forget.  She loses her phone and keys frequently.  Organization was reported to be inconsistent.  She is typically unorganized until the clutter becomes overwhelming then she organizes.  Ms. Wack does not doesn't maintain her organization consistently.  She does not fidgety but finds reasons to get out of her chair during the day.  Ms. Pratt denied verbal or behavioral impulsivity but acts quickly on small suggestions prior to others being ready.  She does this to keep from forgetting.  Ms. Croll indicated that she relates well to others but has had less social interest since working remotely.  She reported having strong ability to read social-emotional cues.                     Procedures Administered: Wechsler Adult Intelligence Scale - IV Behavior Rating Inventory for Executive Function - Adult Version (BRIEF-A) CNS Vital Signs Adult ADHD Self Report Scale   Depression Anxiety and Stress Scale  Adult OCD Inventory  Behavioral Observations:  Ms. Ryer was cooperative and displayed good effort. Attention and performance were inconsistent overall, as Ms. Weier exhibited several instances of self-correction and asking questions to be repeated.  She occasionally solved problems correctly after the standardized time limit and missed several relatively easy problems or questions.  Mood was euthymic with appropriate affect.  The results appear representative of current functioning.  Brief mental status indicated typical general orientation and alertness.  Recent, remote, immediate, working, and delayed memory were intact.  Judgement and insight were good, as was impulse control.  Hallucinations, delusions, and thoughts of self-harm were denied.   Test Results and Interpretation:   General Intellectual Functioning:  The WAIS-IV was used to assess Ms. Lucado's performance across four areas of cognitive ability. When interpreting her scores, it is important to view the results as a snapshot of her current intellectual functioning. As measured by the WAIS-IV, her overall FSIQ score fell in the High range when compared to other adults her age (FSIQ = 70). She showed above age-typical performance on Perceptual Reasoning (PSI = 117) and working memory tasks (WM = 117), with exceptionally high processing speed (PSI = 129), indicating well developed visual perception, auditory memory, and thinking/work speed.  Language skills were also well developed as verbal comprehension (VCI = 110) was high average.  On individual subtests, Ms. Chiriboga average or better in all areas with strength on tasks measuring visual integration, quick decision making, hand-eye coordination.  Relative weakness was noted with attention to visual detail, verbal and visual abstract reason and general knowledge, although all were measured to be within the mid average range.    Wechsler Adult  Intelligence Scale - IV Composite Score Summary  Composite  Sum of Scaled Scores Composite Score Percentile Rank 95% Confidence Interval Qualitative Description  Verbal Comprehen. VCI 35 110 75 104-115 High Average  Perceptual Reason. PRI 39 117 87 110-122 Above Average  Working Memory WMI 26 117 87 109-123 Above Average  Processing Speed PSI 31 129  97 118-134 High  Full Scale IQ FSIQ 131 121 92 117-125 High    Domain Subtest Name  Total Raw Score Scaled Score Percentile Rank  Verbal Similarities SI 25 10 50  Comprehension Vocabulary VC 51 14 91   Information IN 17 11 43  Perceptual Reas. Matrix Reasoning MR 21 13 84   Visual Puzzles VP 21 15 95   Figure Weights FW 14 11 63   Picture Comp. PC 13 10 50  Working Mem. Digit Span DS 34 14 91   Arithmetic AR 17 12 75  Processing Speed Symbol Search SS 44 16 98   Coding CD 90 15 95   Attention and Processing: The results of the CNS Vital Signs testing indicated average neurocognitive processing ability, at a level well below measured intellectual ability (high).  Regarding areas related to attention problems, simple attention was low, while sustained complex attention and cognitive flexibility were high average, and executive function was above average.  Sustained attention was average.  These are the domains most closely associated with attention deficits.  Motor/psychomotor speed was low average, while processing speed and reaction time were low average, indicating mildly slow hand-eye coordination with typical thinking speed and responsiveness on computerized measures.  This is much lower than her processing speed on the WAIS-IV, suggesting much faster processing while handwriting.  Visual memory was above average, while working memory was average and verbal memory was very low, indicating much better developed memory for pictures than words.  The results suggest that Ms. Sarkisyan appears to have below age typical ability attending to simple  tasks with impaired verbal memory, while other processing measure were age typical or better.  All measures were deemed valid.                                                         CNS Vital Signs   Domain Scores Standard Score %ile Validity Indicator Guideline  Neurocognitive Index 98 45 Yes Average  Composite Memory 85 16 Yes Low Average  Verbal Memory 61 1 Yes Very Low  Visual Memory 116 86 Yes Above Average  Psychomotor speed 87 19 Yes Low Average  Reaction Time 95 37 Yes Average  Complex Attention 108 70 Yes Average  Cognitive Flexibility 115 84 Yes High Average  Processing Speed 94 34 Yes Average  Executive Function 116 86 Yes Above Average  Working Memory 98 45 Yes Average  Sustained Attention 106 66 Yes Average  Simple Attention  77 6 Yes Low  Motor Speed 86 18 Yes Low Average   Executive Function:  Ms. Santalucia completed the Self-Report Form of the Behavior Rating Inventory of Executive Function-Adult Version (BRIEF-A) on 06/08/2021. There are no missing item responses in the protocol. Ratings of Ms. Henard's self-regulation do not appear overly negative. Items were completed in a reasonable fashion, suggesting that the respondent did not respond to items in a haphazard or extreme manner. Responses are reasonably consistent. In the context of these validity considerations, ratings of Ms. Reimann's everyday executive function suggest some areas of concern. The overall index, the Global Executive Composite (GEC), was elevated (GEC T = 76, %ile = >99). The Behavioral Regulation Index (BRI) was within normal limits (BRI T = 60, %ile = 86) and the Metacognition Index (MI) was elevated (MI T = 85, %  ile = >99).     Within these summary indicators, all the individual scales are valid. One or more of the individual BRIEF-A scales were elevated, suggesting that Ms. Roundy reports difficulty with some aspects of executive function. Concerns are noted with her ability to adjust to changes in  routine or task demands, initiate problem solving or activity, sustain working memory, plan, organize problem-solving approaches, attend to task-oriented output, and organize environment and materials. Ms. Wyble ability to inhibit impulsive responses, modulate emotions, and monitor social behavior is not described as problematic.  Her profile is consistent with ADHD - Inattentive Presentation.   BRIEFA Self-Report Score Summary Table Scale/Index Raw score T score Percentile  Inhibit 14 60 88  Shift 12 67 96  Emotional Control 15 53 74  Self-Monitor 10 56 82  Behavioral Regulation Index (BRI) 51 60 86  Initiate 21 86 99  Working Memory 19 81 >99  Plan/Organize 25 84 >99  Task Monitor 15 82 >99  Organization of Materials 18 70 97  Metacognition Index (MI) 98 85 >99  Global Executive Composite (GEC) 149 76 >99         Behavioral - Emotional Functioning: Ratings of behavior and emotional functioning indicated much difficulty with attention and organizational difficulty, with few problems regarding physical restlessness and impulse control.  On the Adult ADHD Self Report Scale, Ms. Boer positively endorsed, as occurring often or very often, 7 of 9 items for intention/poor organization and 0 of 9 items for hyperactivity and poor impulse control.  Additionally, 4 of 6 critical items were highly endorsed including problems completing tasks and starting tasks, along with problems organizing, and remembering appointments/meetings.  Endorsement of at least 5 items in either category along with 4 critical items is considered at-risk for ADHD.   Ms. Posner responses on the Adult OCD Inventory indicated little obsessive-compulsive behavior as her score (60) fell within the 'not a problem range.  Four of the 20 individual items were highly endorsed on this measure, related to decision making difficulty, repetitive thoughts, symmetry, and putting items in specific places.  On the Depression, Anxiety, and  Stress Scale, Ms. Yahr's responses indicated moderately high depressed mood with typical levels of anxiety and stress.  Five of the 21 items were highly endorsed on this measure including lack of positive feeling, feeling down, lack of tolerance, low enthusiasm, and increased heartrate.       Summary:   Ms. Delira was evaluated during December 2022 related to impaired concentration and procrastination.  Ms. Wamser presents with inattention distractibility, frequent losing/forgetting, inconsistent organization, and occasional impulsive behavior.  She was unsure when attention deficits started but noticed difficulty with reading recall as early as 3rd grade.  Issues with depression and social isolation were noted during high school, although she was very outgoing prior to then.  Social isolation has increased with working remotely.  Ms. Kosek has noticed increased difficulty at work over the past few years as her job has become less structured and stimulating.  Testing was recommended to evaluate for ADHD along with other factors and conditions that may be influencing attention.  Test results indicated high overall intelligence (WAIS-IV), with very fast processing speed and well-developed Verbal Comprehension, Working Marine scientist, and Garment/textile technologist.  Neurocognitive skills testing indicated well below age typical attention for simple tasks with impaired verbal memory and slower processing efficiency on computerized measures than when handwriting.  Ratings of executive function (EF) indicated clinically significant impairment overall, with noticeable impairment in shifting attention,  procrastination, multitasking, planning/organization, task monitoring, and organizing materials.  Ratings for behavior consistent with ADHD indicated significant difficulty with attention/organization but no problems with hyperactivity/impulse control.  Ratings for emotional functioning suggested moderately high levels of depressed  mood with typical functioning regarding anxiety, stress, and obsessive-compulsive behavior.  Ms. Impson appears to meet the criterion for ADHD, based on testing and rating scales.  Attention deficits were noted my Ms. Peifer during elementary school, although she seemed able to compensate for this through high intelligence.  Depression continues to be an issue as well, although currently controlled through medication.  Recommendations include discussing results with Primary Care Physician or psychiatrist, developing a visual organization system, and resuming individual counseling.  See below for further recommendations.        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  Consider stimulant or other medication to help with attention deficits while continuing medication for depressed mood. Individual counseling is recommended to help Ms. Helderman with developing emotion regulation skills and managing executive function deficits.  Ms. Frymire would benefit from a Cognitive Behavior Therapy approach that focuses on the teaching of coping and organization skills.  Apps such as Headspace and Calm can help with practicing mindfulness activities.    It may help Ms. Kreuser if she discussed her conditions with her employer to determine the need for any accommodations.  Recommended accommodations may include having extra time to process instructions, along with being able to take breaks when overwhelmed.  Prior notice with alternatives would be needed to help Ms. Woodside adjust to any changes in her schedule.   Mental alertness/energy can also be raised by increasing exercise, improving sleep, eating a healthy diet, and managing depression/stress.  Consult with a physician  regarding any changes to physical regimen.  Compensatory strategies for Executive function impairment can take several forms including using external aides (e.g., use of a notebook), learning cognitive strategies (e.g., verbalization), and making environmental modifications (e.g., keeping workspace clutter-free). Research has demonstrated that both healthy adults as well as individuals who have executive deficits commonly rely on external aids for executive and other cognitive processes. The probability of success with compensatory strategies can be enhanced by building on an individual's existing strategies, systematically training the new strategies, and tailoring the compensatory strategies to the individual's unique needs and environmental contexts. More frequent use of aides or strategies may be helpful when it comes to memory, and this also may hold true for executive dysfunction.        Revonda Standard Libbie Bartley, Ph.D. Licensed Psychologist - [facility] Licensed psychologist [phone]          Rainey Pines, PhD

## 2021-06-26 NOTE — Progress Notes (Signed)
[facility] Counselor/Therapist Progress Note  Patient ID: Sharon Myers, MRN: [id number],    Date: 06/26/2021  Time Spent: 10:00 - 10:45am   Met with patient to review results of testing.  Patient was at home due to COVID-19 restrictions and session was conducted from therapist's office via video conferencing.  Patient verbally consented to telehealth.     Treatment Type:  Testing - Feedback Session  Reported Symptoms: Patient presents with inattention distractibility, frequent losing/forgetting, inconsistent organization, and impulsive behavior.  She was unsure when attention deficits started but noticed difficulty with reading recall as early as 3rd grade.  Issues with depression and social isolation were noted during high school but was very outgoing prior to then.  social isolating has increased with working remotely.  Patient has noticed increased difficulty at work over the past few years as her job has become less structured and stimulating.  Testing recommended to evaluate for ADHD along with other factors and conditions that may be influencing attention.   Subjective: Interactive feedback was conducted (1 hr.).  It was discussed how patient met the criterion for ADHD and depression along with how these conditions affect her ability to focus and complete tasks.  Recommendations included discussing results with PCP, developing a visual organization system, and seeking individual counseling to help manage depression and stress.  Patient expressed agreement with the results and recommendations.     Total Time of Testing: 5 hrs. Testing and Scoring: 2 hrs. Interactive Feedback:1 hr. Report Writing: 2 hrs.   Diagnosis:Attention deficit hyperactivity disorder (ADHD), predominantly inattentive type  Major depressive disorder, recurrent episode, mild ([place])  Plan: Report to be sent to parent and referring provider.      Rainey Pines, PhD

## 2021-07-01 ENCOUNTER — Encounter: Payer: Self-pay | Admitting: Internal Medicine

## 2021-07-01 ENCOUNTER — Ambulatory Visit: Payer: Managed Care, Other (non HMO) | Admitting: Internal Medicine

## 2021-07-01 VITALS — BP 134/84 | HR 78 | Temp 98.8°F | Ht 69.0 in | Wt 272.0 lb

## 2021-07-01 DIAGNOSIS — F900 Attention-deficit hyperactivity disorder, predominantly inattentive type: Secondary | ICD-10-CM

## 2021-07-01 DIAGNOSIS — F439 Reaction to severe stress, unspecified: Secondary | ICD-10-CM | POA: Diagnosis not present

## 2021-07-01 DIAGNOSIS — F4323 Adjustment disorder with mixed anxiety and depressed mood: Secondary | ICD-10-CM | POA: Diagnosis not present

## 2021-07-01 DIAGNOSIS — Z6841 Body Mass Index (BMI) 40.0 and over, adult: Secondary | ICD-10-CM

## 2021-07-01 DIAGNOSIS — Z79899 Other long term (current) drug therapy: Secondary | ICD-10-CM

## 2021-07-01 DIAGNOSIS — G4733 Obstructive sleep apnea (adult) (pediatric): Secondary | ICD-10-CM

## 2021-07-01 MED ORDER — METHYLPHENIDATE HCL ER (OSM) 18 MG PO TBCR: 18.0000 mg | EXTENDED_RELEASE_TABLET | Freq: Every day | ORAL | 0 refills | Status: DC

## 2021-07-01 NOTE — Progress Notes (Signed)
Chief Complaint  Patient presents with   Follow-up    HPI: Mikalia Fessel 51 y.o. come in for for follow-up after psychological evaluation with Dr. Henderson Cloud better regard to attentional difficulties. She has a copy of the report today. 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 Mood about the same is on 50 mg of sertraline which has been somewhat helpful in the past at higher dose felt flat. Has not talk with GYN .  Last seen in November  Report evaluation by Dr. Henderson Cloud back.  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  Discrepant Lowne manually ability attention with high intellectual ability. ROS: See pertinent positives and negatives per HPI.  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     Family History  Problem Relation Age of Onset   Cancer Mother        rare blood cancer   Stroke Mother    Heart failure Mother    Hypertension Father    Nephrolithiasis Father    Diabetes Father    COPD Father    Cancer Father        kidney   Hyperlipidemia Father    Sudden death Father    Sleep apnea Father    Alcoholism Father    Obesity Father    Thyroid disease Maternal Aunt    Arthritis Maternal Grandmother        rheumatoid   Heart disease Maternal Grandfather    Cancer Maternal Grandfather        stomach   Cancer Paternal Grandfather        stomach ca    Social History   Socioeconomic History   Marital status:  Married    Spouse name: Writer   Number of children: 2   Years of education: Not on file   Highest education level: Not on file  Occupational History   Occupation: Work from home  Tobacco Use   Smoking status: Never   Smokeless tobacco: Never  Substance and Sexual Activity   Alcohol use: Yes    Comment: occasionally   Drug use: No   Sexual activity: Yes    Comment: tubal if CS  Other Topics Concern   Not on file  Social History Narrative   Usually receives 6 hours of sleep at night   4 people living in the home. No pets   Married 2 young children   Bachelors degree [employer] 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      Originally from [place] increase her area for 20 years   Clarinda etoh no tobacco no ets FA    Social Determinants of Radio broadcast assistant Strain:  Not on file  Food Insecurity: Not on file  Transportation Needs: Not on file  Physical Activity: Not on file  Stress: Not on file  Social Connections: Not on file    Outpatient Medications Prior to Visit  Medication Sig Dispense Refill   sertraline (ZOLOFT) 25 MG tablet Take 25 mg by mouth at bedtime. Taking 50 mg      buPROPion (WELLBUTRIN SR) 150 MG 12 hr tablet Take 1 tablet (150 mg total) by mouth daily. (Patient not taking: Reported on 07/01/2021) 90 tablet 0   Cetirizine HCl (ZYRTEC ALLERGY PO) Take by mouth. (Patient not taking: Reported on 07/01/2021)     Loratadine (CLARITIN PO) Take by mouth. (Patient not taking: Reported on 07/01/2021)     Prenatal Vit-Fe Fumarate-FA (MULTIVITAMIN-PRENATAL) 27-0.8 MG TABS tablet Take 1 tablet by mouth daily at 12 noon.     VITAMIN D PO Take by mouth daily.     No facility-administered medications prior to visit.     EXAM:  BP 134/84 (BP Location: Left Arm, Patient Position: Sitting, Cuff Size: Normal)    Pulse 78    Temp 98.8 F (37.1 C) (Oral)    Ht 5\' 9"  (1.753 m)    Wt 272 lb (123.4 kg)    LMP 06/30/2021    SpO2 98%    BMI 40.17 kg/m   Body mass  index is 40.17 kg/m.  GENERAL: vitals reviewed and listed above, alert, oriented, appears well hydrated and in no acute distress HEENT: atraumatic, conjunctiva  clear, no obvious abnormalities on inspection of external nose and ears OP : Masked NECK: no obvious masses on inspection palpation  CV: HRRR, no clubbing cyanosis or  peripheral edema nl cap refill  MS: moves all extremities without noticeable focal  abnormality PSYCH: pleasant and cooperative, no obvious depression or anxiety Lab Results  Component Value Date   WBC 7.6 10/24/2020   HGB 14.0 10/24/2020   HCT 41.2 10/24/2020   PLT 202.0 10/24/2020   GLUCOSE 103 (H) 10/24/2020   CHOL 173 10/24/2020   TRIG 86.0 10/24/2020   HDL 54.90 10/24/2020   LDLCALC 101 (H) 10/24/2020   ALT 19 10/24/2020   AST 17 10/24/2020   NA 142 10/24/2020   K 4.2 10/24/2020   CL 105 10/24/2020   CREATININE 0.84 10/24/2020   BUN 10 10/24/2020   CO2 29 10/24/2020   TSH 1.86 10/24/2020   HGBA1C 5.9 10/24/2020   BP Readings from Last 3 Encounters:  07/01/21 134/84  01/08/21 128/80  10/21/20 138/86  Psychological assessment reviewed  By dr Janett Billow abet. 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 Anxiety possibly secondary.  Versus primary Attend to history of OSA has not used machine in number of months does get tired in the afternoon sleepiness which could be additional. Risk-benefit discussed begin with low-dose extended release Concerta methylphenidate 18 mg visit med check virtual okay in 3 to 4 weeks with increasing dosage as indicated. At this time remain on  the 50 mg of the sertraline consider increase as appropriate.  -Patient advised to return or notify health care team  if  new concerns  arise. Psychological Report ( 8 pages) read and review  visit counsel and  plan  45 minutes  Patient Instructions  Good to see you today .  Adding  stimulant medicine  for focus and attention.   Begin  low dose concerta  can adjust up as appropriate.   Stay on sertraline 50 mg per day .     Virtual visit or med check in 3-4 weeks or as needed.   Standley Brooking. Haddy Mullinax M.D.

## 2021-07-01 NOTE — Patient Instructions (Addendum)
Good to see you today .  Adding  stimulant medicine  for focus and attention.   Begin  low dose concerta  can adjust up as appropriate.   Stay on sertraline 50 mg per day .     Virtual visit or med check in 3-4 weeks or as needed.

## 2021-07-05 ENCOUNTER — Encounter: Payer: Self-pay | Admitting: Internal Medicine

## 2021-07-05 DIAGNOSIS — F900 Attention-deficit hyperactivity disorder, predominantly inattentive type: Secondary | ICD-10-CM | POA: Insufficient documentation

## 2021-07-23 ENCOUNTER — Telehealth (INDEPENDENT_AMBULATORY_CARE_PROVIDER_SITE_OTHER): Payer: Managed Care, Other (non HMO) | Admitting: Internal Medicine

## 2021-07-23 ENCOUNTER — Encounter: Payer: Self-pay | Admitting: Internal Medicine

## 2021-07-23 DIAGNOSIS — F900 Attention-deficit hyperactivity disorder, predominantly inattentive type: Secondary | ICD-10-CM

## 2021-07-23 DIAGNOSIS — F439 Reaction to severe stress, unspecified: Secondary | ICD-10-CM | POA: Diagnosis not present

## 2021-07-23 DIAGNOSIS — Z79899 Other long term (current) drug therapy: Secondary | ICD-10-CM

## 2021-07-23 MED ORDER — METHYLPHENIDATE HCL ER (OSM) 27 MG PO TBCR: 27.0000 mg | EXTENDED_RELEASE_TABLET | ORAL | 0 refills | Status: DC

## 2021-07-23 NOTE — Progress Notes (Signed)
Virtual Visit via Video Note  I connected with Sharon Myers on 07/23/21 at  8:30 AM EST by a video enabled telemedicine application and verified that I am speaking with the correct person using two identifiers. Location patient: home Location provider home office Persons participating in the virtual visit: patient, provider  [place] national recommendations  regarding COVID 19 pandemic   video visit is advised over in office visit for this patient.  Patient aware  of the limitations of evaluation and management by telemedicine and  availability of in person appointments. and agreed to proceed.   HPI: Sharon Myers presents for video visit follow-up initiation of stimulant medicine Concerta 17 mg for attentional deficit problem.  She is taking the medicine every day only side effect first day had some dizziness that resolved with time sleeping better has noticed some subtle improvement able to sit still longer doing a task and focusing. In regards to job adjustments that has improved and worked out. Less teeth clenching at night and morning headaches. Mood is stable.  Less stressed     ROS: See pertinent positives and negatives per HPI.  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 [name]   BACK SURGERY     hirsch   SKIN GRAFT      Family History  Problem Relation Age of Onset   Cancer Mother        rare blood cancer    Stroke Mother    Heart failure Mother    Hypertension Father    Nephrolithiasis Father    Diabetes Father    COPD Father    Cancer Father        kidney   Hyperlipidemia Father    Sudden death Father    Sleep apnea Father    Alcoholism Father    Obesity Father    Thyroid disease Maternal Aunt    Arthritis Maternal Grandmother        rheumatoid   Heart disease Maternal Grandfather    Cancer Maternal Grandfather        stomach   Cancer Paternal Grandfather        stomach ca    Social History   Tobacco Use   Smoking status: Never   Smokeless tobacco: Never  Substance Use Topics   Alcohol use: Yes    Comment: occasionally   Drug use: No      Current Outpatient Medications:    methylphenidate (CONCERTA) 27 MG PO CR tablet, Take 1 tablet (27 mg total) by mouth every morning., Disp: 30 tablet, Rfl: 0   sertraline (ZOLOFT) 25 MG tablet, Take 25 mg by mouth at bedtime. Taking 50 mg , Disp: , Rfl:  EXAM: BP Readings from Last 3 Encounters:  07/01/21 134/84  01/08/21 128/80  10/21/20 138/86    VITALS per patient if applicable:  GENERAL: alert, oriented, appears well and in no acute distress looks well   HEENT: atraumatic, conjunttiva clear, no obvious abnormalities on inspection of external nose and ears NECK: normal movements of the head and neck LUNGS: on inspection no signs of respiratory distress, breathing rate appears normal, no obvious gross SOB, gasping or wheezing CV: no obvious cyanosis PSYCH/NEURO: pleasant and cooperative, no obvious depression or anxiety, speech and thought processing grossly intact   ASSESSMENT AND PLAN:  Discussed the following assessment and plan:    ICD-10-CM   1. ADHD, predominantly inattentive type  F90.0     2. Stress and anxiety  F43.9     3. Medication management  Z79.899     Improvement without sig se .  Increase concerta to 27 mg  disp 30  Fu med check 1 mos ( can cancel if doing better and same dose)  otherwise 3  mos rov.  Counseled.   Expectant management and discussion of plan and treatment with opportunity to ask questions and all were answered. The patient agreed with the plan and demonstrated an understanding of the instructions.   Advised to call back or seek an in-person evaluation if worsening  or having  further concerns  in interim. Return in about 1 month (around 08/20/2021) for med check  can be virtual .    [name], MD

## 2021-08-19 ENCOUNTER — Telehealth (INDEPENDENT_AMBULATORY_CARE_PROVIDER_SITE_OTHER): Payer: Managed Care, Other (non HMO) | Admitting: Internal Medicine

## 2021-08-19 ENCOUNTER — Encounter: Payer: Self-pay | Admitting: Internal Medicine

## 2021-08-19 DIAGNOSIS — F4323 Adjustment disorder with mixed anxiety and depressed mood: Secondary | ICD-10-CM

## 2021-08-19 DIAGNOSIS — Z79899 Other long term (current) drug therapy: Secondary | ICD-10-CM | POA: Diagnosis not present

## 2021-08-19 DIAGNOSIS — F900 Attention-deficit hyperactivity disorder, predominantly inattentive type: Secondary | ICD-10-CM

## 2021-08-19 MED ORDER — METHYLPHENIDATE HCL ER (OSM) 27 MG PO TBCR: 27.0000 mg | EXTENDED_RELEASE_TABLET | ORAL | 0 refills | Status: DC

## 2021-08-19 NOTE — Progress Notes (Signed)
?Virtual Visit via Video Note ? ?I connected with Sharon Myers on 08/19/21 at 12:30 PM EST by a video enabled telemedicine application and verified that I am speaking with the correct person using two identifiers. ?Location patient: home ?Location provider:work office ?Persons participating in the virtual visit: patient, provider ? ?[facility] national recommendations  regarding COVID 19 pandemic   video visit is advised over in office visit for this patient.  ?Patient aware  of the limitations of evaluation and management by telemedicine and  availability of in person appointments. and agreed to proceed. ? ? ?HPI: ?Sharon Myers presents for video visit  med check for adhd   sx  ?See last note : increased to concernta  27 mg  from 17  ?Going well   at times    at times jittery   at onset .   Subsided after an hour .   Sometimes heart  faster but goes away uses Dr Malachi Bonds   .  Caffeine in the morning so has separated time of medicine from the caffeine ?Takes medication at around 6 30  ?Sleep  great . Doing  ?Mood :   good   no problems.  ?Also things are really going well with the job changes in the medication.  Feels stable at this current dose. ?ROS: See pertinent positives and negatives per HPI. ?Gets regular routine checkups with GYN ?Past Medical History:  ?Diagnosis Date  ? Allergy   ? AMA (advanced maternal age) multigravida 57+   ? Anxiety   ? Back pain   ? Depression   ? Dyspnea   ? Family history of blood clots 05/22/2013  ? Fatigue   ? Goiter, unspecified   ? History of chicken pox   ? Hx of varicella   ? Joint pain   ? knees and ankles ache  ? OSA on CPAP   ? Other ankle sprain and strain   ? achilles rupture  ? Poor posture   ? Poor sleep   ? Ruptured lumbar disc   ? Snoring   ? 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 ? ?Past Surgical History:  ?Procedure Laterality Date  ? ACHILLES TENDON REPAIR    ? Left softball injury Dr. [name]  ? BACK SURGERY    ? hirsch  ? SKIN GRAFT    ? ? ?Family  History  ?Problem Relation Age of Onset  ? Cancer Mother   ?     rare blood cancer  ? Stroke Mother   ? Heart failure Mother   ? Hypertension Father   ? Nephrolithiasis Father   ? Diabetes Father   ? COPD Father   ? Cancer Father   ?     kidney  ? Hyperlipidemia Father   ? Sudden death Father   ? Sleep apnea Father   ? Alcoholism Father   ? Obesity Father   ? Thyroid disease Maternal Aunt   ? Arthritis Maternal Grandmother   ?     rheumatoid  ? Heart disease Maternal Grandfather   ? Cancer Maternal Grandfather   ?     stomach  ? Cancer Paternal Grandfather   ?     stomach ca  ? ? ?Social History  ? ?Tobacco Use  ? Smoking status: Never  ? Smokeless tobacco: Never  ?Substance Use Topics  ? Alcohol use: Yes  ?  Comment: occasionally  ? Drug use: No  ? ? ? ? ?Current Outpatient Medications:  ?  sertraline (ZOLOFT) 25 MG tablet, Take 25 mg by mouth at bedtime. Taking 50 mg , Disp: , Rfl:  ?  methylphenidate (CONCERTA) 27 MG PO CR tablet, Take 1 tablet (27 mg total) by mouth every morning., Disp: 30 tablet, Rfl: 0 ? ?EXAM: ?BP Readings from Last 3 Encounters:  ?07/01/21 134/84  ?01/08/21 128/80  ?10/21/20 138/86  ? ? ?VITALS per patient if applicable: ? ?GENERAL: alert, oriented, appears well and in no acute distress ? ?HEENT: atraumatic, conjunttiva clear, no obvious abnormalities on inspection of external nose and ears ? ?NECK: normal movements of the head and neck ? ?LUNGS: on inspection no signs of respiratory distress, breathing rate appears normal, no obvious gross SOB, gasping or wheezing ? ?CV: no obvious cyanosis ? ?MS: moves all visible extremities without noticeable abnormality ? ?PSYCH/NEURO: pleasant and cooperative, no obvious depression or anxiety, speech and thought processing grossly intact ? ? ?ASSESSMENT AND PLAN: ? ?Discussed the following assessment and plan: ? ?  ICD-10-CM   ?1. ADHD, predominantly inattentive type  F90.0   ?  ?2. Medication management  Z79.899   ?  ?3. Adjustment reaction with  anxiety and depression  F43.23   ? Better resolved  ?  ? ?Maintain at current dose of Concerta 27 mg, benefit more than risk without significant side effect. ?plan follow-up visit 3 to 4 months virtual okay or in person ?Contact us when needs refills let us know if insurance will cover 90 days and 1 bottle. ? ?Counseled.  ? Expectant management and discussion of plan and treatment with opportunity to ask questions and all were answered. The patient agreed with the plan and demonstrated an understanding of the instructions. ?  ?Advised to call back or seek an in-person evaluation if worsening  or having  further concerns  in interim. ?Return for 3 to 4 months, virtual or in person. ? ? ? ?[name], MD  ?

## 2021-09-17 ENCOUNTER — Other Ambulatory Visit: Payer: Self-pay | Admitting: Internal Medicine

## 2021-09-24 MED ORDER — METHYLPHENIDATE HCL ER (OSM) 27 MG PO TBCR: 27.0000 mg | EXTENDED_RELEASE_TABLET | ORAL | 0 refills | Status: DC

## 2021-09-24 NOTE — Telephone Encounter (Signed)
Sent in electronically . °90 days  °

## 2021-09-24 NOTE — Telephone Encounter (Signed)
Last Vv 08/19/21 ?Filled 08/19/21  ?Is it ok to refill? ?

## 2021-09-26 ENCOUNTER — Other Ambulatory Visit: Payer: Self-pay | Admitting: Internal Medicine

## 2021-09-29 MED ORDER — SERTRALINE HCL 50 MG PO TABS: 50.0000 mg | ORAL_TABLET | Freq: Every day | ORAL | 1 refills | Status: DC

## 2021-09-29 NOTE — Telephone Encounter (Signed)
Last Vv 08/19/21 ?Filled 07/01/21 ?Is it ok to refill? ?

## 2021-12-23 ENCOUNTER — Other Ambulatory Visit: Payer: Self-pay

## 2021-12-23 ENCOUNTER — Encounter: Payer: Self-pay | Admitting: Internal Medicine

## 2021-12-23 ENCOUNTER — Telehealth (INDEPENDENT_AMBULATORY_CARE_PROVIDER_SITE_OTHER): Payer: Managed Care, Other (non HMO) | Admitting: Internal Medicine

## 2021-12-23 VITALS — Ht 68.5 in | Wt 275.0 lb

## 2021-12-23 DIAGNOSIS — F900 Attention-deficit hyperactivity disorder, predominantly inattentive type: Secondary | ICD-10-CM

## 2021-12-23 DIAGNOSIS — Z6841 Body Mass Index (BMI) 40.0 and over, adult: Secondary | ICD-10-CM

## 2021-12-23 DIAGNOSIS — R7303 Prediabetes: Secondary | ICD-10-CM

## 2021-12-23 DIAGNOSIS — E8881 Metabolic syndrome: Secondary | ICD-10-CM | POA: Diagnosis not present

## 2021-12-23 DIAGNOSIS — E559 Vitamin D deficiency, unspecified: Secondary | ICD-10-CM

## 2021-12-23 DIAGNOSIS — E88819 Insulin resistance, unspecified: Secondary | ICD-10-CM

## 2021-12-23 DIAGNOSIS — Z79899 Other long term (current) drug therapy: Secondary | ICD-10-CM | POA: Diagnosis not present

## 2021-12-23 MED ORDER — METHYLPHENIDATE HCL ER (OSM) 36 MG PO TBCR: 36.0000 mg | EXTENDED_RELEASE_TABLET | Freq: Every day | ORAL | 0 refills | Status: DC

## 2021-12-23 NOTE — Telephone Encounter (Signed)
Last Vv 08/19/21 Filled 09/24/21 Is it ok to refill?

## 2021-12-23 NOTE — Progress Notes (Signed)
Virtual Visit via Video Note  I connected with Sharon Myers on 12/23/21 at 12:15 PM EDT by a video enabled telemedicine application and verified that I am speaking with the correct person using two identifiers. Location patient: home Location provider:work office Persons participating in the virtual visit: patient, provider   HPI: Sharon Myers presents for video visit for a number of issues ADD ADHD has been using 27 mg but felt like she needed a boost so added the 18 mg left over and felt it was more effective. Requesting dose adjusted ?  Increasing concerta    as she has noticed reverting back to decreased focus.  Job is okay but upcoming work burdens this time of year.  Anxiety stable continuing on sertraline 50 mg.  Weight is going up lifestyle is chaotic as far as eating time has young children.  Was in weight management over 4 years ago with some help Currently not regular weighing and has irregular eating.  Is aware she needs to change habits and choice of food.  ROS: See pertinent positives and negatives per HPI.  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 [name]   BACK SURGERY     hirsch   SKIN GRAFT      Family History  Problem Relation Age of Onset   Cancer Mother        rare blood cancer   Stroke Mother    Heart failure Mother    Hypertension Father    Nephrolithiasis Father    Diabetes Father     COPD Father    Cancer Father        kidney   Hyperlipidemia Father    Sudden death Father    Sleep apnea Father    Alcoholism Father    Obesity Father    Thyroid disease Maternal Aunt    Arthritis Maternal Grandmother        rheumatoid   Heart disease Maternal Grandfather    Cancer Maternal Grandfather        stomach   Cancer Paternal Grandfather        stomach ca    Social History   Tobacco Use   Smoking status: Never   Smokeless tobacco: Never  Substance Use Topics   Alcohol use: Yes    Comment: occasionally   Drug use: No      Current Outpatient Medications:    methylphenidate (CONCERTA) 36 MG PO CR tablet, Take 1 tablet (36 mg total) by mouth daily., Disp: 90 tablet, Rfl: 0   sertraline (ZOLOFT) 50 MG tablet, Take 1 tablet (50 mg total) by mouth at bedtime. Taking 50 mg, Disp: 90 tablet, Rfl: 1  EXAM: BP  Readings from Last 3 Encounters:  07/01/21 134/84  01/08/21 128/80  10/21/20 138/86    VITALS per patient if applicable:  GENERAL: alert, oriented, appears well and in no acute distress  HEENT: atraumatic, conjunttiva clear, no obvious abnormalities on inspection of external nose and ears  NECK: normal movements of the head and neck  LUNGS: on inspection no signs of respiratory distress, breathing rate appears normal, no obvious gross SOB, gasping or wheezing  CV: no obvious cyanosis  MS: moves all visible extremities without noticeable abnormality  PSYCH/NEURO: pleasant and cooperative, no obvious depression or anxiety, speech and thought processing grossly intact Lab Results  Component Value Date   WBC 7.6 10/24/2020   HGB 14.0 10/24/2020   HCT 41.2 10/24/2020   PLT 202.0 10/24/2020   GLUCOSE 103 (H) 10/24/2020   CHOL 173 10/24/2020   TRIG 86.0 10/24/2020   HDL 54.90 10/24/2020   LDLCALC 101 (H) 10/24/2020   ALT 19 10/24/2020   AST 17 10/24/2020   NA 142 10/24/2020   K 4.2 10/24/2020   CL 105 10/24/2020   CREATININE 0.84 10/24/2020   BUN  10 10/24/2020   CO2 29 10/24/2020   TSH 1.86 10/24/2020   HGBA1C 5.9 10/24/2020    ASSESSMENT AND PLAN:  Discussed the following assessment and plan:    ICD-10-CM   1. ADHD, predominantly inattentive type  T01.6 Basic metabolic panel    CBC with Differential/Platelet    Hemoglobin A1c    Hepatic function panel    Lipid panel    TSH    T4, free    Vitamin D, 25-hydroxy   Increase dose to 36 mg Concerta per day    2. BMI 40.0-44.9, adult (HCC)  W10.93 Basic metabolic panel    CBC with Differential/Platelet    Hemoglobin A1c    Hepatic function panel    Lipid panel    TSH    T4, free    Vitamin D, 25-hydroxy   Past history weight management has chaotic schedule advised to track substitute look into insurance for [employer] , [employer] not fda appr for weight managment but     3. Medication management  A35.573 Basic metabolic panel    CBC with Differential/Platelet    Hemoglobin A1c    Hepatic function panel    Lipid panel    TSH    T4, free    Vitamin D, 25-hydroxy    4. Insulin resistance  U20.25 Basic metabolic panel    CBC with Differential/Platelet    Hemoglobin A1c    Hepatic function panel    Lipid panel    TSH    T4, free    Vitamin D, 25-hydroxy    5. Prediabetes  K27.06 Basic metabolic panel    CBC with Differential/Platelet    Hemoglobin A1c    Hepatic function panel    Lipid panel    TSH    T4, free    Vitamin D, 25-hydroxy    6. Vitamin D deficiency  C37.6 Basic metabolic panel    CBC with Differential/Platelet    Hemoglobin A1c    Hepatic function panel    Lipid panel    TSH    T4, free    Vitamin D, 25-hydroxy    7. Body mass index (BMI) of 40.0 to 44.9 in adult [facility])  E83.15 Basic metabolic panel    CBC with Differential/Platelet    Hemoglobin A1c    Hepatic function panel    Lipid panel  TSH    T4, free    Vitamin D, 25-hydroxy      Counseled. r advise weighing tracking check into medication I am willing to order [facility] if she is  interested in her insurance will cover.  As I do not think Darcel Bayley will be covered by her insurance but she can check into that She certainly is a candidate for these medicine classes.  Otherwise. Plan updated lab and go from there  Expectant management and discussion of plan and treatment with opportunity to ask questions and all were answered. The patient agreed with the plan and demonstrated an understanding of the instructions.   Advised to call back or seek an in-person evaluation if worsening  or having  further concerns  in interim. Return for depending on results and how doing .    [name], MD   E rx error had to send in [place] second time

## 2021-12-24 MED ORDER — METHYLPHENIDATE HCL ER (OSM) 36 MG PO TBCR: 36.0000 mg | EXTENDED_RELEASE_TABLET | Freq: Every day | ORAL | 0 refills | Status: DC

## 2021-12-24 NOTE — Addendum Note (Signed)
Addended byBurnis Medin on: 12/24/2021 09:38 AM   Modules accepted: Orders

## 2022-01-10 IMAGING — CT CT CARDIAC CORONARY ARTERY CALCIUM SCORE
3 series · 14 of 20 positions shown, 16 images · non-contrast
Comparison: None.
COMPARISON: None.

Addendum:
EXAM:
OVER-READ INTERPRETATION  CT CHEST

The following report is an over-read performed by radiologist Dr.
Florindo Tchitende Sambungo Andre [REDACTED] on 02/09/2021. This
over-read does not include interpretation of cardiac or coronary
anatomy or pathology. The coronary calcium score interpretation by
the cardiologist is attached.
CLINICAL DATA: Risk stratification: 50 Year-old White Female
Coronary Calcium Score
TECHNIQUE: The patient was scanned on a Siemens Force scanner. Axial
non-contrast 3 mm slices were carried out through the heart. The
data set was analyzed on a dedicated work station and scored using
the Agatson method.

[Series 2: cascseq 2.0 sa36 70% (id) · axial · 0.40mm/px · z∈[-213,-123]mm · 4 of 76 slices shown]
[im 16/76  vessel]
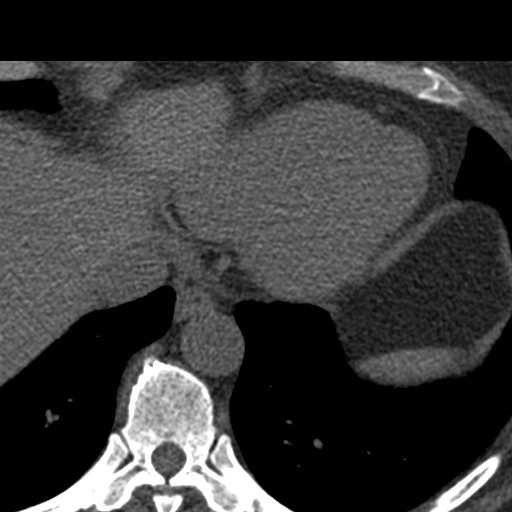
[im 31/76  vessel]
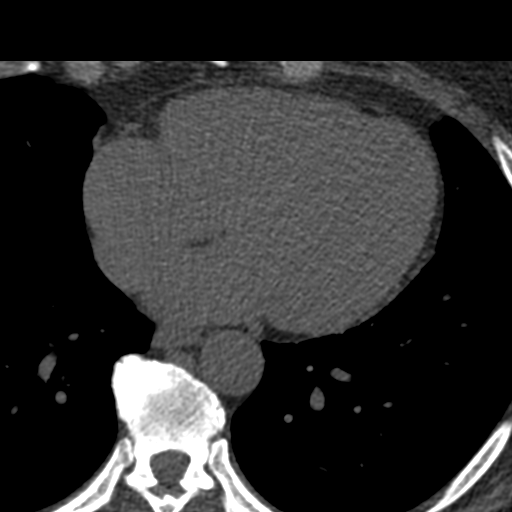
[im 46/76  vessel]
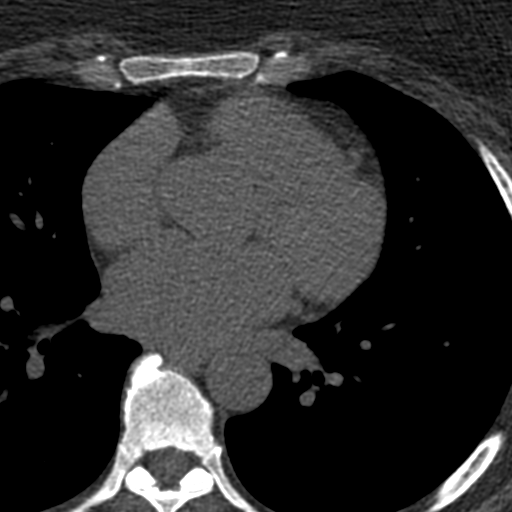
[im 61/76  vessel]
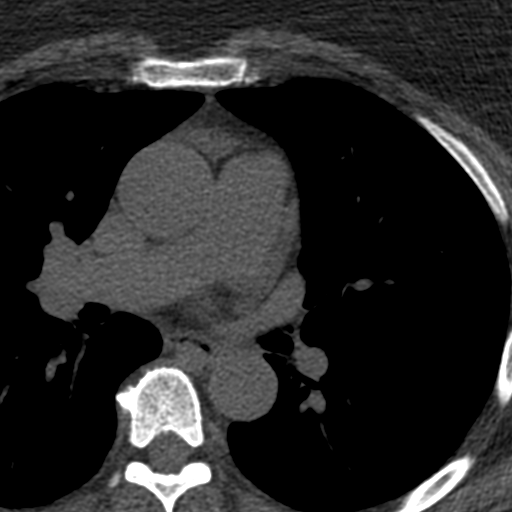

[Series 3: cascseq 2.0 bf37 st · axial · 0.73mm/px · z∈[-219,-119]mm · 5 of 76 slices shown, 7 images]
[im 13/76  vessel]
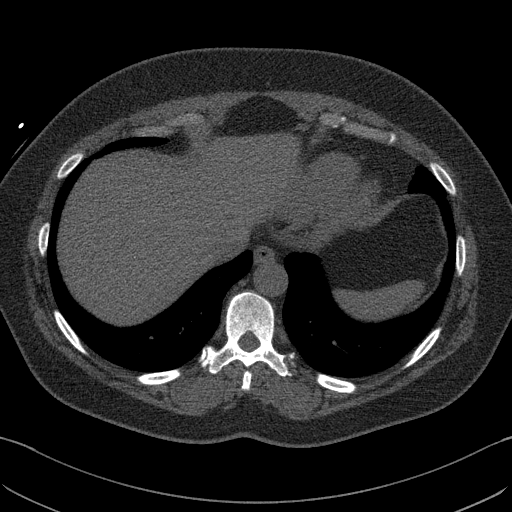
[im 13/76  lung]
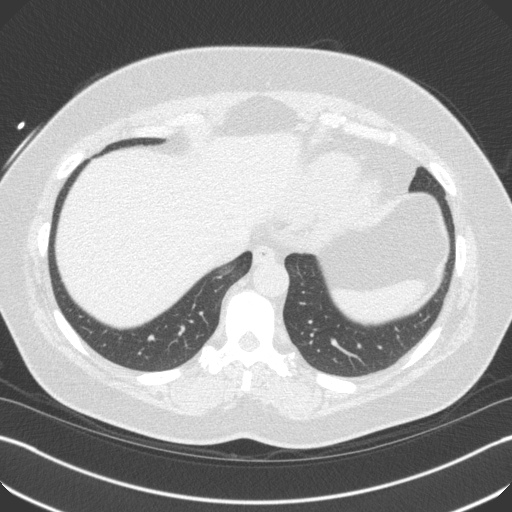
[im 26/76  vessel]
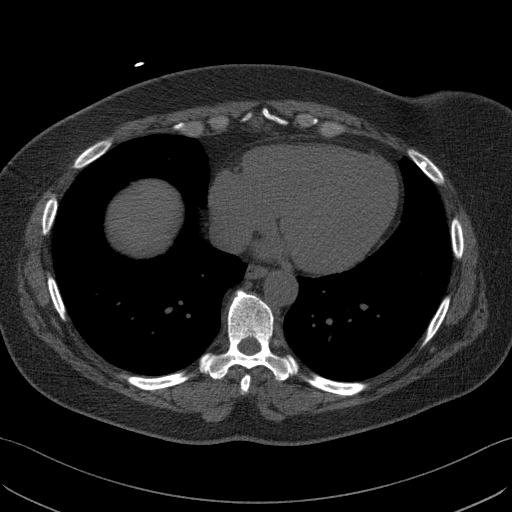
[im 38/76  vessel]
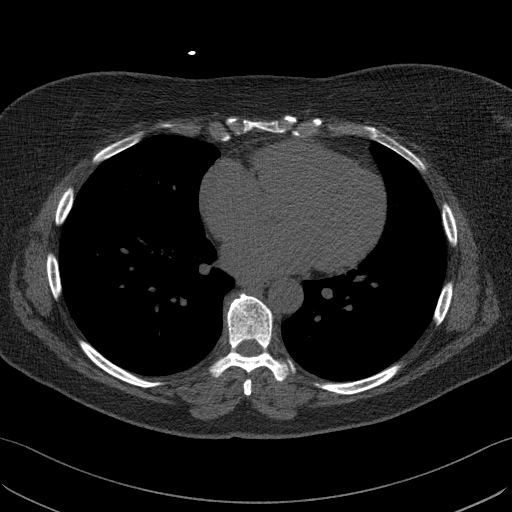
[im 51/76  vessel]
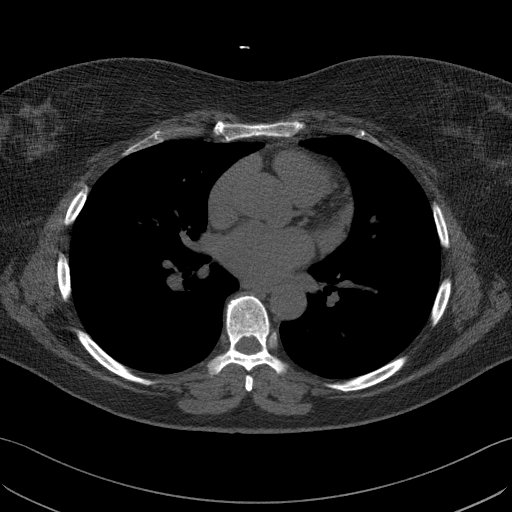
[im 63/76  vessel]
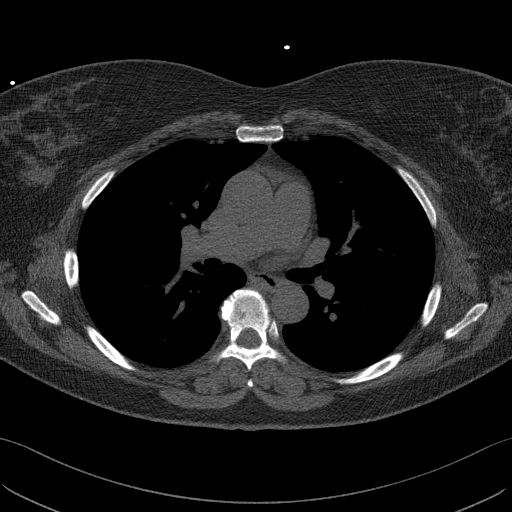
[im 63/76  lung]
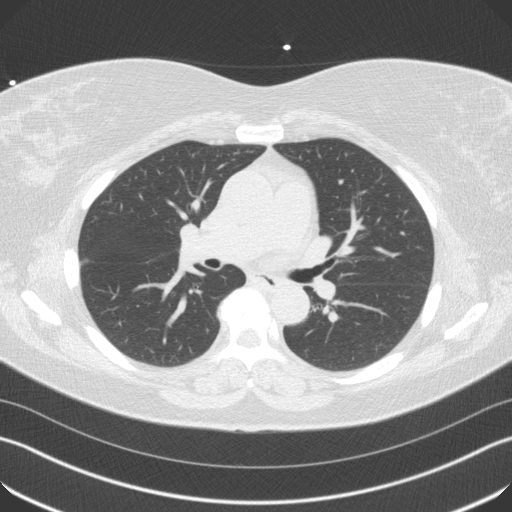

[Series 4: cascseq 2.0 br59 lung · axial · 0.73mm/px · z∈[-219,-119]mm · 5 of 76 slices shown]
[im 13/76  lung]
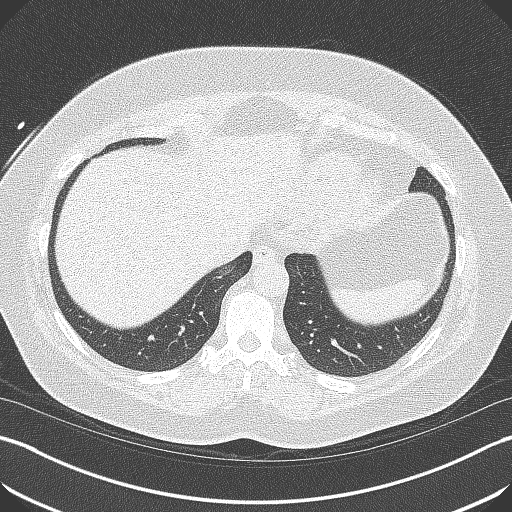
[im 26/76  lung]
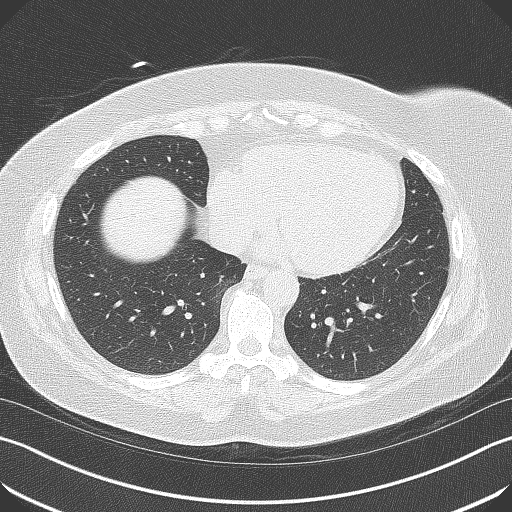
[im 38/76  lung]
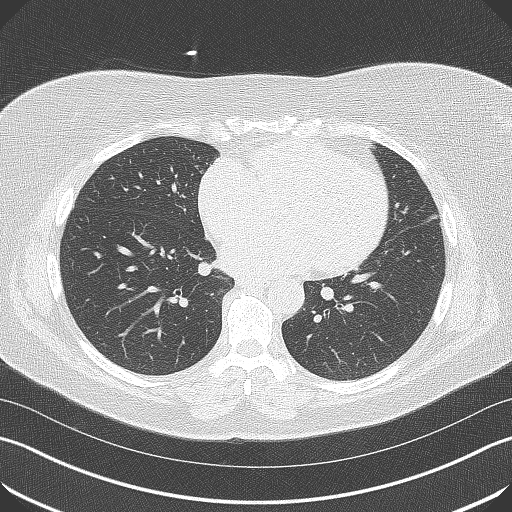
[im 51/76  lung]
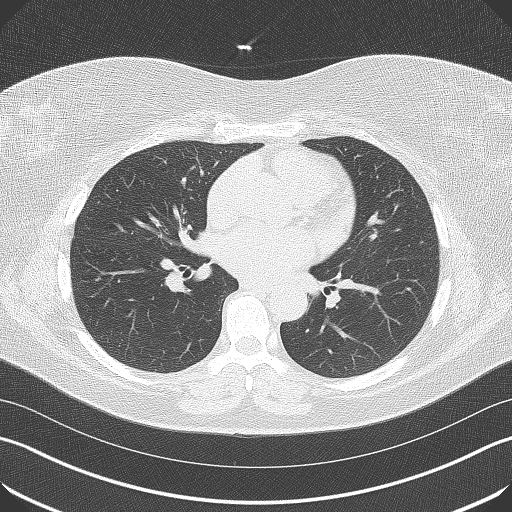
[im 63/76  lung]
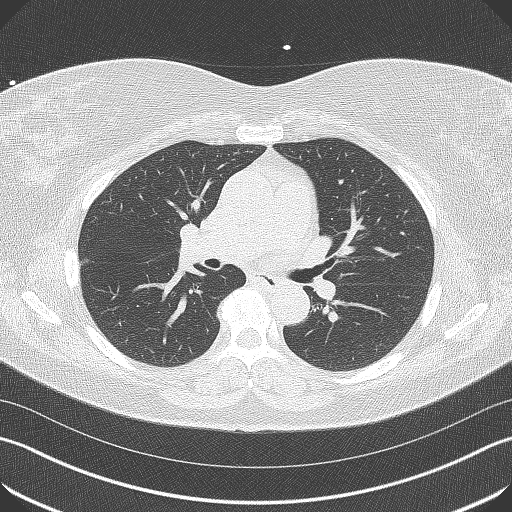

[14 of 20 positions shown; findings below may reference images not displayed]

FINDINGS: Within the visualized portions of the thorax there are no suspicious
appearing pulmonary nodules or masses, there is no acute
consolidative airspace disease, no pleural effusions, no
pneumothorax and no lymphadenopathy. Visualized portions of the
upper abdomen are unremarkable. There are no aggressive appearing
lytic or blastic lesions noted in the visualized portions of the
skeleton.
IMPRESSION: 1. No significant incidental noncardiac findings are noted.
FINDINGS: Non-cardiac: See separate report from [REDACTED].

Ascending Aorta: Normal caliber.

Pericardium: Normal.

Coronary arteries: Normal origins.

Coronary Calcium Score:

Left main: 0

Left anterior descending artery: 0

Left circumflex artery: 0

Right coronary artery: 0

Total: 0

Percentile: 1st for age, sex, and race matched control.
IMPRESSION: 1. Coronary calcium score of 0. This was 1st percentile for age,
gender, and race matched controls.

RECOMMENDATIONS:



If CAC = 0, it is reasonable to withhold statin therapy and reassess
in 5 to 10 years, as long as higher risk conditions are absent
(diabetes mellitus, family history of premature CHD in first degree
relatives (males <55 years; females <65 years), cigarette smoking,
LDL >=190 mg/dL or other independent risk factors).

If CAC is 1 to 99, it is reasonable to initiate statin therapy for
patients >=55 years of age.

If CAC is >=100 or >=75th percentile, it is reasonable to initiate
statin therapy at any age.

Cardiology referral should be considered for patients with CAC
scores =400 or >=75th percentile.

*6958 AHA/ACC/AACVPR/AAPA/ABC/KAKI/KAKI/MINARUL/Krpto/RUDI/BLANCHETTE/DREY
Guideline on the Management of Blood Cholesterol: A Report of the
American College of Cardiology/American Heart Association Task Force
on Clinical Practice Guidelines. J Am Coll Cardiol.
9612;73(24):9300-9704.

*** End of Addendum ***
EXAM:
OVER-READ INTERPRETATION  CT CHEST

The following report is an over-read performed by radiologist Dr.
Florindo Tchitende Sambungo Andre [REDACTED] on 02/09/2021. This
over-read does not include interpretation of cardiac or coronary
anatomy or pathology. The coronary calcium score interpretation by
the cardiologist is attached.
FINDINGS: Within the visualized portions of the thorax there are no suspicious
appearing pulmonary nodules or masses, there is no acute
consolidative airspace disease, no pleural effusions, no
pneumothorax and no lymphadenopathy. Visualized portions of the
upper abdomen are unremarkable. There are no aggressive appearing
lytic or blastic lesions noted in the visualized portions of the
skeleton.
IMPRESSION: 1. No significant incidental noncardiac findings are noted.

## 2022-01-20 ENCOUNTER — Encounter (INDEPENDENT_AMBULATORY_CARE_PROVIDER_SITE_OTHER): Payer: Self-pay

## 2022-01-25 ENCOUNTER — Other Ambulatory Visit: Payer: Self-pay

## 2022-01-28 ENCOUNTER — Encounter: Payer: Self-pay | Admitting: Internal Medicine

## 2022-01-28 DIAGNOSIS — Z1211 Encounter for screening for malignant neoplasm of colon: Secondary | ICD-10-CM

## 2022-01-28 NOTE — Telephone Encounter (Signed)
Ok to do cologuard   to get this screening done easier . Please send order   may want to do colonoscopy at some point .   Would need an in person visit to evaluate vertigo . If she has allergy sx can try otc claritin zyrtec   in interim .  Have her get some Bp readings also .  Let me know  when rx for   concerta  is clarified  I think she means send 30 days of concerta '36mg'$  ot the local pharmacy

## 2022-02-01 MED ORDER — SERTRALINE HCL 50 MG PO TABS: 50.0000 mg | ORAL_TABLET | Freq: Every day | ORAL | 0 refills | Status: DC

## 2022-02-01 MED ORDER — METHYLPHENIDATE HCL ER (OSM) 36 MG PO TBCR
36.0000 mg | EXTENDED_RELEASE_TABLET | Freq: Every day | ORAL | 0 refills | Status: DC
Start: 2022-02-01 — End: 2022-04-08

## 2022-02-03 ENCOUNTER — Ambulatory Visit: Payer: Managed Care, Other (non HMO) | Admitting: Internal Medicine

## 2022-02-03 ENCOUNTER — Encounter: Payer: Self-pay | Admitting: Internal Medicine

## 2022-02-03 VITALS — BP 142/92 | HR 79 | Temp 98.5°F | Wt 268.6 lb

## 2022-02-03 DIAGNOSIS — Z79899 Other long term (current) drug therapy: Secondary | ICD-10-CM | POA: Diagnosis not present

## 2022-02-03 DIAGNOSIS — F900 Attention-deficit hyperactivity disorder, predominantly inattentive type: Secondary | ICD-10-CM | POA: Diagnosis not present

## 2022-02-03 DIAGNOSIS — R42 Dizziness and giddiness: Secondary | ICD-10-CM | POA: Diagnosis not present

## 2022-02-03 DIAGNOSIS — H9190 Unspecified hearing loss, unspecified ear: Secondary | ICD-10-CM

## 2022-02-03 DIAGNOSIS — R030 Elevated blood-pressure reading, without diagnosis of hypertension: Secondary | ICD-10-CM

## 2022-02-03 NOTE — Progress Notes (Signed)
Chief Complaint  Patient presents with   Dizziness    Patient c/o of ear issue.  Feel like I'm walking sideway, off balance.  Worse when laying down, turning over or head movement. Sx started 2 weeks ago.     HPI: Sharon Myers 51 y.o. come in for   fu medication I am and onset vertigo a couple weeks ago onset suddenly when she  laid down to  sleep and sitting and rolling over . Acute onset   moving head to motions .  Positional trigger.  No specific illness injury or change in medicine husband and family say her hearing is down but no acute hearing loss and no tinnitus.  No extra sounds except may be cracking Tried allergy pill 1 day and did not help much Today it is slightly better and she is coping with it if she does not move her head a lot No falling change in vision serious headache staggering gait. Is concerned about what the cause could be. She has read about vertigo and its causes  Med check Concerta 27 mg has been helpful with cwork as if it could be causing her symptoms. She is continuing on the sertraline does not think that is related.  ROS: See pertinent positives and negatives per HPI. Her blood pressure is usually normal has never had a reading that high as today. 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     Family History  Problem Relation Age of Onset   Cancer Mother        rare blood cancer   Stroke Mother    Heart failure Mother    Hypertension Father    Nephrolithiasis Father    Diabetes Father    COPD  Father    Cancer Father        kidney   Hyperlipidemia Father    Sudden death Father    Sleep apnea Father    Alcoholism Father    Obesity Father    Thyroid disease Maternal Aunt    Arthritis Maternal Grandmother        rheumatoid   Heart disease Maternal Grandfather    Cancer Maternal Grandfather        stomach   Cancer Paternal Grandfather        stomach ca    Social History   Socioeconomic History   Marital status: Married    Spouse name: [name]   Number of children: 2   Years of education: Not on file   Highest education level: Not on file  Occupational History   Occupation: Work from home  Tobacco Use   Smoking status: Never   Smokeless tobacco: Never  Substance and Sexual Activity   Alcohol use: Yes    Comment: occasionally   Drug use: No   Sexual activity:  Yes    Comment: tubal if CS  Other Topics Concern   Not on file  Social History Narrative   Usually receives 6 hours of sleep at night   4 people living in the home. No pets   Married 2 young children   Bachelors degree [employer] 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      Originally from [place] increase her area for 20 years   G4P2   Social etoh no tobacco no ets FA    Social Determinants of Radio broadcast assistant Strain: Not on file  Food Insecurity: Not on file  Transportation Needs: Not on file  Physical Activity: Not on file  Stress: Not on file  Social Connections: Not on file    Outpatient Medications Prior to Visit  Medication Sig Dispense Refill   methylphenidate 27 MG PO TB24 Take 1 tablet by mouth every morning.     sertraline (ZOLOFT) 50 MG tablet Take 1 tablet (50 mg total) by mouth at bedtime. Taking 50 mg 90 tablet 0   methylphenidate (CONCERTA) 36 MG PO CR tablet Take 1 tablet (36 mg total) by mouth daily. (Patient not taking: Reported on 02/03/2022) 30 tablet 0   No facility-administered medications prior to visit.     EXAM:  BP (!) 142/92 (BP Location: Left Arm, Cuff  Size: Large)   Pulse 79   Temp 98.5 F (36.9 C) (Oral)   Wt 268 lb 9.6 oz (121.8 kg)   SpO2 95%   BMI 40.25 kg/m   Body mass index is 40.25 kg/m.  GENERAL: vitals reviewed and listed above, alert, oriented, appears well hydrated and in no acute distress HEENT: atraumatic, conjunctiva  clear, no obvious abnormalities on inspection of external nose and ears TMs are clear EOMs are full face is symmetrical OP : Is midline NECK: no obvious masses on inspection palpation  LUNGS: clear to auscultation bilaterally, no wheezes, rales or rhonchi, good air movement CV: HRRR, no clubbing cyanosis or  peripheral edema nl cap refill  Abdomen soft organomegaly guarding or rebound NEURO: oriented x 3 CN 3-12 appear intact.  Gross hearing slightly down on the left compared to right no focal muscle weakness or atrophy. DTRs symmetrical. Gait WNL.  Grossly non focal. No tremor or abnormal movement. Heel to toes okay negative Romberg finger-to-nose normal.  When laying down quickly she has some mild nystagmus to the right MS: moves all extremities without noticeable focal  abnormality PSYCH: pleasant and cooperative, no obvious depression or anxiety Lab Results  Component Value Date   WBC 7.6 10/24/2020   HGB 14.0 10/24/2020   HCT 41.2 10/24/2020   PLT 202.0 10/24/2020   GLUCOSE 103 (H) 10/24/2020   CHOL 173 10/24/2020   TRIG 86.0 10/24/2020   HDL 54.90 10/24/2020   LDLCALC 101 (H) 10/24/2020   ALT 19 10/24/2020   AST 17 10/24/2020   NA 142 10/24/2020   K 4.2 10/24/2020   CL 105 10/24/2020   CREATININE 0.84 10/24/2020   BUN 10 10/24/2020   CO2 29 10/24/2020   TSH 1.86 10/24/2020   HGBA1C 5.9 10/24/2020   BP Readings from Last 3 Encounters:  02/03/22 (!) 142/92  07/01/21 134/84  01/08/21 128/80    ASSESSMENT AND PLAN:  Discussed the following assessment and plan:  Vertigo  Decreased hearing, unspecified laterality  ADHD, predominantly inattentive type  Medication  management  Elevated blood pressure reading Lab orders in system proceed with lab. We discussed potential  work-up which can include rehab / audiologic evaluation for the perceptive hearing changes If needed MRI of head although I think this is peripheral positional vertigo.  The medication. This time she wants to try off the Concerta to see if it makes a difference lab work will let us know which way she wants to proceed Do think she should have formal hearing testing but there may have been an issue before these episodes. Her exam is reassuring today. -Patient advised to return or notify health care team  if  new concerns arise.  Patient Instructions  Stop the concerta short term and see how goes.  Let us know if you want to try  neuro rehab. ( Pt for vertigo)  Your exam is reassuring. Get  your labs this week .  At some point want evaluation for your hearing. . I can refer for ENT / audiology..   Benign Positional Vertigo Vertigo is the feeling that you or your surroundings are moving when they are not. Benign positional vertigo is the most common form of vertigo. This is usually a harmless condition (benign). This condition is positional. This means that symptoms are triggered by certain movements and positions. This condition can be dangerous if it occurs while you are doing something that could cause harm to yourself or others. This includes activities such as driving or operating machinery. What are the causes? The inner ear has fluid-filled canals that help your brain sense movement and balance. When the fluid moves, the brain receives messages about your body's position. With benign positional vertigo, calcium crystals in the inner ear break free and disturb the inner ear area. This causes your brain to receive confusing messages about your body's position. What increases the risk? You are more likely to develop this condition if: You are a woman. You are 19 years of age or  older. You have recently had a head injury. You have an inner ear disease. What are the signs or symptoms? Symptoms of this condition usually happen when you move your head or your eyes in different directions. Symptoms may start suddenly and usually last for less than a minute. They include: Loss of balance and falling. Feeling like you are spinning or moving. Feeling like your surroundings are spinning or moving. Nausea and vomiting. Blurred vision. Dizziness. Involuntary eye movement (nystagmus). Symptoms can be mild and cause only minor problems, or they can be severe and interfere with daily life. Episodes of benign positional vertigo may return (recur) over time. Symptoms may also improve over time. How is this diagnosed? This condition may be diagnosed based on: Your medical history. A physical exam of the head, neck, and ears. Positional tests to check for or stimulate vertigo. You may be asked to turn your head and change positions, such as going from sitting to lying down. A health care provider will watch for symptoms of vertigo. You may be referred to a health care provider who specializes in ear, nose, and throat problems (ENT or otolaryngologist) or a provider who specializes in disorders of the nervous system (neurologist). How is this treated?  This condition may be treated in a session in which your health care provider moves your head in specific positions to help the displaced crystals in your inner ear move. Treatment for this condition may take several sessions. Surgery may be needed in severe cases, but this is rare. In some cases, benign positional vertigo may resolve on its own in 2-4 weeks. Follow these instructions at  home: Safety Move slowly. Avoid sudden body or head movements or certain positions, as told by your health care provider. Avoid driving or operating machinery until your health care provider says it is safe. Avoid doing any tasks that would be  dangerous to you or others if vertigo occurs. If you have trouble walking or keeping your balance, try using a cane for stability. If you feel dizzy or unstable, sit down right away. Return to your normal activities as told by your health care provider. Ask your health care provider what activities are safe for you. General instructions Take over-the-counter and prescription medicines only as told by your health care provider. Drink enough fluid to keep your urine pale yellow. Keep all follow-up visits. This is important. Contact a health care provider if: You have a fever. Your condition gets worse or you develop new symptoms. Your family or friends notice any behavioral changes. You have nausea or vomiting that gets worse. You have numbness or a prickling and tingling sensation. Get help right away if you: Have difficulty speaking or moving. Are always dizzy or faint. Develop severe headaches. Have weakness in your legs or arms. Have changes in your hearing or vision. Develop a stiff neck. Develop sensitivity to light. These symptoms may represent a serious problem that is an emergency. Do not wait to see if the symptoms will go away. Get medical help right away. Call your local emergency services (911 in the [place] Do not drive yourself to the hospital. Summary Vertigo is the feeling that you or your surroundings are moving when they are not. Benign positional vertigo is the most common form of vertigo. This condition is caused by calcium crystals in the inner ear that become displaced. This causes a disturbance in an area of the inner ear that helps your brain sense movement and balance. Symptoms include loss of balance and falling, feeling that you or your surroundings are moving, nausea and vomiting, and blurred vision. This condition can be diagnosed based on symptoms, a physical exam, and positional tests. Follow safety instructions as told by your health care provider and keep all  follow-up visits. This is important. This information is not intended to replace advice given to you by your health care provider. Make sure you discuss any questions you have with your health care provider. Document Revised: 04/30/2020 Document Reviewed: 04/30/2020 Elsevier Patient Education  [place] Dior Stepter M.D.

## 2022-02-03 NOTE — Patient Instructions (Signed)
Stop the concerta short term and see how goes.  Let us know if you want to try  neuro rehab. ( Pt for vertigo)  Your exam is reassuring. Get  your labs this week .  At some point want evaluation for your hearing. . I can refer for ENT / audiology..   Benign Positional Vertigo Vertigo is the feeling that you or your surroundings are moving when they are not. Benign positional vertigo is the most common form of vertigo. This is usually a harmless condition (benign). This condition is positional. This means that symptoms are triggered by certain movements and positions. This condition can be dangerous if it occurs while you are doing something that could cause harm to yourself or others. This includes activities such as driving or operating machinery. What are the causes? The inner ear has fluid-filled canals that help your brain sense movement and balance. When the fluid moves, the brain receives messages about your body's position. With benign positional vertigo, calcium crystals in the inner ear break free and disturb the inner ear area. This causes your brain to receive confusing messages about your body's position. What increases the risk? You are more likely to develop this condition if: You are a woman. You are 51 years of age or older. You have recently had a head injury. You have an inner ear disease. What are the signs or symptoms? Symptoms of this condition usually happen when you move your head or your eyes in different directions. Symptoms may start suddenly and usually last for less than a minute. They include: Loss of balance and falling. Feeling like you are spinning or moving. Feeling like your surroundings are spinning or moving. Nausea and vomiting. Blurred vision. Dizziness. Involuntary eye movement (nystagmus). Symptoms can be mild and cause only minor problems, or they can be severe and interfere with daily life. Episodes of benign positional vertigo may return (recur)  over time. Symptoms may also improve over time. How is this diagnosed? This condition may be diagnosed based on: Your medical history. A physical exam of the head, neck, and ears. Positional tests to check for or stimulate vertigo. You may be asked to turn your head and change positions, such as going from sitting to lying down. A health care provider will watch for symptoms of vertigo. You may be referred to a health care provider who specializes in ear, nose, and throat problems (ENT or otolaryngologist) or a provider who specializes in disorders of the nervous system (neurologist). How is this treated?  This condition may be treated in a session in which your health care provider moves your head in specific positions to help the displaced crystals in your inner ear move. Treatment for this condition may take several sessions. Surgery may be needed in severe cases, but this is rare. In some cases, benign positional vertigo may resolve on its own in 2-4 weeks. Follow these instructions at home: Safety Move slowly. Avoid sudden body or head movements or certain positions, as told by your health care provider. Avoid driving or operating machinery until your health care provider says it is safe. Avoid doing any tasks that would be dangerous to you or others if vertigo occurs. If you have trouble walking or keeping your balance, try using a cane for stability. If you feel dizzy or unstable, sit down right away. Return to your normal activities as told by your health care provider. Ask your health care provider what activities are safe for you. General instructions  Take over-the-counter and prescription medicines only as told by your health care provider. Drink enough fluid to keep your urine pale yellow. Keep all follow-up visits. This is important. Contact a health care provider if: You have a fever. Your condition gets worse or you develop new symptoms. Your family or friends notice any  behavioral changes. You have nausea or vomiting that gets worse. You have numbness or a prickling and tingling sensation. Get help right away if you: Have difficulty speaking or moving. Are always dizzy or faint. Develop severe headaches. Have weakness in your legs or arms. Have changes in your hearing or vision. Develop a stiff neck. Develop sensitivity to light. These symptoms may represent a serious problem that is an emergency. Do not wait to see if the symptoms will go away. Get medical help right away. Call your local emergency services (911 in the [place] Do not drive yourself to the hospital. Summary Vertigo is the feeling that you or your surroundings are moving when they are not. Benign positional vertigo is the most common form of vertigo. This condition is caused by calcium crystals in the inner ear that become displaced. This causes a disturbance in an area of the inner ear that helps your brain sense movement and balance. Symptoms include loss of balance and falling, feeling that you or your surroundings are moving, nausea and vomiting, and blurred vision. This condition can be diagnosed based on symptoms, a physical exam, and positional tests. Follow safety instructions as told by your health care provider and keep all follow-up visits. This is important. This information is not intended to replace advice given to you by your health care provider. Make sure you discuss any questions you have with your health care provider. Document Revised: 04/30/2020 Document Reviewed: 04/30/2020 Elsevier Patient Education  [place].

## 2022-02-05 ENCOUNTER — Other Ambulatory Visit (INDEPENDENT_AMBULATORY_CARE_PROVIDER_SITE_OTHER): Payer: Managed Care, Other (non HMO)

## 2022-02-05 DIAGNOSIS — Z79899 Other long term (current) drug therapy: Secondary | ICD-10-CM | POA: Diagnosis not present

## 2022-02-05 DIAGNOSIS — R7303 Prediabetes: Secondary | ICD-10-CM

## 2022-02-05 DIAGNOSIS — E8881 Metabolic syndrome: Secondary | ICD-10-CM | POA: Diagnosis not present

## 2022-02-05 DIAGNOSIS — E559 Vitamin D deficiency, unspecified: Secondary | ICD-10-CM

## 2022-02-05 DIAGNOSIS — F900 Attention-deficit hyperactivity disorder, predominantly inattentive type: Secondary | ICD-10-CM

## 2022-02-05 DIAGNOSIS — Z6841 Body Mass Index (BMI) 40.0 and over, adult: Secondary | ICD-10-CM | POA: Diagnosis not present

## 2022-02-05 LAB — BASIC METABOLIC PANEL
BUN: 13 mg/dL (ref 6–23)
CO2: 27 mEq/L (ref 19–32)
Calcium: 9.1 mg/dL (ref 8.4–10.5)
Chloride: 104 mEq/L (ref 96–112)
Creatinine, Ser: 0.8 mg/dL (ref 0.40–1.20)
GFR: 85.23 mL/min (ref >60.00)
Glucose, Bld: 115 mg/dL — ABNORMAL HIGH (ref 70–99)
Potassium: 3.8 mEq/L (ref 3.5–5.1)
Sodium: 136 mEq/L (ref 135–145)

## 2022-02-05 LAB — LIPID PANEL
Cholesterol: 173 mg/dL (ref 0–200)
HDL: 49.5 mg/dL (ref >39.00)
LDL Cholesterol: 107 mg/dL — ABNORMAL HIGH (ref 0–99)
NonHDL: 123.76
Total CHOL/HDL Ratio: 4
Triglycerides: 84 mg/dL (ref 0.0–149.0)
VLDL: 16.8 mg/dL (ref 0.0–40.0)

## 2022-02-05 LAB — HEPATIC FUNCTION PANEL
ALT: 22 U/L (ref 0–35)
AST: 17 U/L (ref 0–37)
Albumin: 4.1 g/dL (ref 3.5–5.2)
Alkaline Phosphatase: 75 U/L (ref 39–117)
Bilirubin, Direct: 0.1 mg/dL (ref 0.0–0.3)
Total Bilirubin: 0.6 mg/dL (ref 0.2–1.2)
Total Protein: 6.8 g/dL (ref 6.0–8.3)

## 2022-02-05 LAB — CBC WITH DIFFERENTIAL/PLATELET
Basophils Absolute: 0 10*3/uL (ref 0.0–0.1)
Basophils Relative: 0.5 % (ref 0.0–3.0)
Eosinophils Absolute: 0.1 10*3/uL (ref 0.0–0.7)
Eosinophils Relative: 2 % (ref 0.0–5.0)
HCT: 41 % (ref 36.0–46.0)
Hemoglobin: 13.9 g/dL (ref 12.0–15.0)
Lymphocytes Relative: 33.3 % (ref 12.0–46.0)
Lymphs Abs: 2.3 10*3/uL (ref 0.7–4.0)
MCHC: 33.8 g/dL (ref 30.0–36.0)
MCV: 86.1 fl (ref 78.0–100.0)
Monocytes Absolute: 0.5 10*3/uL (ref 0.1–1.0)
Monocytes Relative: 6.8 % (ref 3.0–12.0)
Neutro Abs: 3.9 10*3/uL (ref 1.4–7.7)
Neutrophils Relative %: 57.4 % (ref 43.0–77.0)
Platelets: 212 10*3/uL (ref 150.0–400.0)
RBC: 4.76 Mil/uL (ref 3.87–5.11)
RDW: 13.5 % (ref 11.5–15.5)
WBC: 6.8 10*3/uL (ref 4.0–10.5)

## 2022-02-05 LAB — TSH: TSH: 1.91 u[IU]/mL (ref 0.35–5.50)

## 2022-02-05 LAB — T4, FREE: Free T4: 0.9 ng/dL (ref 0.60–1.60)

## 2022-02-05 LAB — HEMOGLOBIN A1C: Hgb A1c MFr Bld: 6 % (ref 4.6–6.5)

## 2022-02-05 LAB — VITAMIN D 25 HYDROXY (VIT D DEFICIENCY, FRACTURES): VITD: 30.73 ng/mL (ref 30.00–100.00)

## 2022-02-18 ENCOUNTER — Encounter: Payer: Self-pay | Admitting: Internal Medicine

## 2022-02-21 NOTE — Telephone Encounter (Signed)
So lab results are basically normal except   blood sugar  in pre diabetic range   but no explanation  for  symptoms. Apologies about the delay as I was out of office for a bit.  Do you think the medication  is at all causing a symptoms?    Can plan a fu if on going      Lab Results  Component Value Date   WBC 6.8 02/05/2022   HGB 13.9 02/05/2022   HCT 41.0 02/05/2022   PLT 212.0 02/05/2022   GLUCOSE 115 (H) 02/05/2022   CHOL 173 02/05/2022   TRIG 84.0 02/05/2022   HDL 49.50 02/05/2022   LDLCALC 107 (H) 02/05/2022   ALT 22 02/05/2022   AST 17 02/05/2022   NA 136 02/05/2022   K 3.8 02/05/2022   CL 104 02/05/2022   CREATININE 0.80 02/05/2022   BUN 13 02/05/2022   CO2 27 02/05/2022   TSH 1.91 02/05/2022   HGBA1C 6.0 02/05/2022

## 2022-02-21 NOTE — Progress Notes (Signed)
See phone message

## 2022-03-06 NOTE — Telephone Encounter (Signed)
Please contact patient and see how she is doing.  If her symptoms of dizziness or hearing issues are not improved I advise another office visit for further evaluation.  I will be out of the office again so need to schedule with another provider if she is having ongoing symptoms.

## 2022-03-15 NOTE — Telephone Encounter (Signed)
Spoke to patient. Patient updates that her Vertigo comes and goes. Few weeks ago she was feeling fine. Then came back. Currently patient states she is doing okay, doesn't need to be seen. Advise patient if her sx are worsen, she can contact our office to be evaluate. Patient verbalized understanding.

## 2022-04-06 LAB — COLOGUARD: COLOGUARD: NEGATIVE

## 2022-04-06 NOTE — Progress Notes (Signed)
Routed a message to Dr. Regis Bill

## 2022-04-06 NOTE — Progress Notes (Signed)
Please clarify which dose of Concerta and which pharmacy send me a refill request for the correct amount so I can sign it off.

## 2022-04-06 NOTE — Progress Notes (Signed)
Neg cologuard can  repeat in 3 years

## 2022-04-08 MED ORDER — METHYLPHENIDATE HCL ER (OSM) 36 MG PO TBCR: 36.0000 mg | EXTENDED_RELEASE_TABLET | Freq: Every day | ORAL | 0 refills | Status: DC

## 2022-04-19 ENCOUNTER — Encounter: Payer: Self-pay | Admitting: Internal Medicine

## 2022-04-27 ENCOUNTER — Encounter: Payer: Self-pay | Admitting: Internal Medicine

## 2022-04-27 ENCOUNTER — Ambulatory Visit: Payer: Managed Care, Other (non HMO) | Admitting: Internal Medicine

## 2022-04-27 VITALS — BP 144/94 | HR 84 | Temp 98.0°F | Ht 68.75 in | Wt 272.6 lb

## 2022-04-27 DIAGNOSIS — E88819 Insulin resistance, unspecified: Secondary | ICD-10-CM | POA: Diagnosis not present

## 2022-04-27 DIAGNOSIS — F900 Attention-deficit hyperactivity disorder, predominantly inattentive type: Secondary | ICD-10-CM | POA: Diagnosis not present

## 2022-04-27 DIAGNOSIS — Z79899 Other long term (current) drug therapy: Secondary | ICD-10-CM

## 2022-04-27 DIAGNOSIS — Z23 Encounter for immunization: Secondary | ICD-10-CM

## 2022-04-27 DIAGNOSIS — Z0000 Encounter for general adult medical examination without abnormal findings: Secondary | ICD-10-CM | POA: Diagnosis not present

## 2022-04-27 DIAGNOSIS — Z6841 Body Mass Index (BMI) 40.0 and over, adult: Secondary | ICD-10-CM

## 2022-04-27 DIAGNOSIS — R030 Elevated blood-pressure reading, without diagnosis of hypertension: Secondary | ICD-10-CM

## 2022-04-27 DIAGNOSIS — R7303 Prediabetes: Secondary | ICD-10-CM

## 2022-04-27 MED ORDER — SERTRALINE HCL 50 MG PO TABS: 50.0000 mg | ORAL_TABLET | Freq: Every day | ORAL | 3 refills | Status: DC

## 2022-04-27 NOTE — Patient Instructions (Signed)
Good to see you today. Attention to  lifestyle intervention healthy eating and activity.  Will refill  sertraline today  can try alternating 25/50 if you wish .  Talks with your gyne about ablation .  Sharon Myers would be a choice for you since you have re diabetes .  IF BP  remains up may consider medication   look at [facility] eating to lower bP Take blood pressure readings twice a day for 3-5 days and record .     Take 2 -3 readings at each sitting .   Can send in readings  by My Chart.     Before checking your blood pressure make sure: You are seated and quite for 5 min before checking Feet are flat on the floor Siting in chair with your back supported straight up and down Arm resting on table or arm of chair at heart level Bladder is empty You have NOT had caffeine or tobacco within the last 30 min  [email or web address]

## 2022-04-27 NOTE — Progress Notes (Signed)
Chief Complaint  Patient presents with   Annual Exam    HPI: Patient  Sharon Myers  51 y.o. comes in today for [place] med  check  Sertraline 50 mg a day has been on for a number of years tries to wean gets extra anxious so resigned to staying on medication at this time Add meds  ; takes medicine Concerta equivalent 36 mg during the workweek feels that it works quite well not worried about side effects.   Bp   usually fine  creeping up.  Over the years but has not been diagnosed with hypertension.  Has not checked blood pressure readings at home  Not using cpap  not sure if can use mouth piece.  But would like to go back to the specialist to see if she is eligible for that kind of intervention. It was Dr. Apolonio Schneiders  Is interested in help for weight loss struggling she is very busy discussion about current medication interventions in addition to lifestyle. Health Maintenance  Topic Date Due   Hepatitis C Screening  Never done   COLONOSCOPY (Pts 45-14yr Insurance coverage will need to be confirmed)  Never done   COVID-19 Vaccine (3 - Pfizer risk series) 01/25/2020   MAMMOGRAM  04/30/2023   PAP SMEAR-Modifier  04/29/2024   TETANUS/TDAP  04/27/2032   INFLUENZA VACCINE  Completed   HIV Screening  Completed   Zoster Vaccines- Shingrix  Completed   HPV VACCINES  Aged Out   Health Maintenance Review LIFESTYLE:  Exercise:   no comment Tobacco/ETS: no Alcohol:  miimal Sugar beverages: sopped sodas  in a month  Sleep: at least 6  Drug use: no HH of  4  4 pets  Work: 40 - 50     ROS:  REST of 12 system review negative except as per HPI states she is pretty much done with her.  They are regular but will interested in having an ablation she is to see her OB/GYN soon Dr. CGarwin Brothers  Gets pain in medial foot when she lays down at night on the right but no neuropathy pain with ambulation not persistent no injury.  Her positional vertigo resolved over a month or  so see previous notes.     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 BKirby FunkSURGERY     hirsch   SKIN GRAFT      Family History  Problem Relation Age of Onset   Cancer Mother        rare blood cancer   Stroke Mother    Heart failure Mother    Hypertension Father    Nephrolithiasis Father  Diabetes Father    COPD Father    Cancer Father        kidney   Hyperlipidemia Father    Sudden death Father    Sleep apnea Father    Alcoholism Father    Obesity Father    Thyroid disease Maternal Aunt    Arthritis Maternal Grandmother        rheumatoid   Heart disease Maternal Grandfather    Cancer Maternal Grandfather        stomach   Cancer Paternal Grandfather        stomach ca    Social History   Socioeconomic History   Marital status: Married    Spouse name: [name]   Number of children: 2   Years of education: Not on file   Highest education level: Not on file  Occupational History   Occupation: Work from home  Tobacco Use   Smoking status: Never   Smokeless tobacco: Never  Substance and Sexual Activity   Alcohol use: Yes    Comment: occasionally   Drug use: No   Sexual activity: Yes    Comment: tubal if CS  Other Topics Concern   Not on file  Social History Narrative   Usually receives 6 hours of sleep at night   4 people living in the home. No pets   Married 2 young children    Bachelors degree [employer] 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      Originally from [place] increase her area for 20 years   G4P2   Social etoh no tobacco no ets FA    Social Determinants of Radio broadcast assistant Strain: Not on file  Food Insecurity: Not on file  Transportation Needs: Not on file  Physical Activity: Not on file  Stress: Not on file  Social Connections: Not on file    Outpatient Medications Prior to Visit  Medication Sig Dispense Refill   MAGNESIUM PO Take by mouth daily. Magnesium Oxide     methylphenidate (CONCERTA) 36 MG PO CR tablet Take 1 tablet (36 mg total) by mouth daily. 30 tablet 0   VITAMIN D PO Take by mouth daily.     sertraline (ZOLOFT) 50 MG tablet Take 1 tablet (50 mg total) by mouth at bedtime. Taking 50 mg 90 tablet 0   No facility-administered medications prior to visit.     EXAM:  BP (!) 144/94 (BP Location: Right Arm, Patient Position: Sitting, Cuff Size: Large)   Pulse 84   Temp 98 F (36.7 C) (Oral)   Ht 5' 8.75" (1.746 m)   Wt 272 lb 9.6 oz (123.7 kg)   LMP 04/06/2022 (Approximate)   SpO2 96%   BMI 40.55 kg/m   Body mass index is 40.55 kg/m. Wt Readings from Last 3 Encounters:  04/27/22 272 lb 9.6 oz (123.7 kg)  02/03/22 268 lb 9.6 oz (121.8 kg)  12/23/21 275 lb (124.7 kg)    Physical Exam: Vital signs reviewed [id number] is a well-developed well-nourished alert cooperative    who appearsr stated age in no acute distress.  HEENT: normocephalic atraumatic , Eyes: PERRL EOM's full, conjunctiva clear, Nares: paten,t no deformity discharge or tenderness., Ears: no deformity EAC's clear TMs with normal landmarks. Mouth: clear OP, no lesions, edema.  Moist mucous membranes. Dentition in adequate repair. NECK: supple without masses, thyromegaly or bruits. CHEST/PULM:  Clear to auscultation and percussion breath sounds equal no wheeze , rales or rhonchi. No chest wall deformities  or tenderness. Breast: normal by inspection . No  dimpling, discharge, masses, tenderness or discharge . CV: PMI is nondisplaced, S1 S2 no gallops, murmurs, rubs. Peripheral pulses are full without delay.No JVD .  ABDOMEN: Bowel sounds normal nontender  No guard or rebound, no hepato splenomegal no CVA tenderness.   Extremtities:  No clubbing cyanosis or edema, no acute joint swelling or redness no focal atrophy NEURO:  Oriented x3, cranial nerves 3-12 appear to be intact, no obvious focal weakness,gait within normal limits no abnormal reflexes or asymmetrical SKIN: No acute rashes normal turgor, color, no bruising or petechiae. PSYCH: Oriented, good eye contact, no obvious depression anxiety, cognition and judgment appear normal. LN: no cervical axillaryadenopathy  Lab Results  Component Value Date   WBC 6.8 02/05/2022   HGB 13.9 02/05/2022   HCT 41.0 02/05/2022   PLT 212.0 02/05/2022   GLUCOSE 115 (H) 02/05/2022   CHOL 173 02/05/2022   TRIG 84.0 02/05/2022   HDL 49.50 02/05/2022   LDLCALC 107 (H) 02/05/2022   ALT 22 02/05/2022   AST 17 02/05/2022   NA 136 02/05/2022   K 3.8 02/05/2022   CL 104 02/05/2022   CREATININE 0.80 02/05/2022   BUN 13 02/05/2022   CO2 27 02/05/2022   TSH 1.91 02/05/2022   HGBA1C 6.0 02/05/2022    BP Readings from Last 3 Encounters:  04/27/22 (!) 144/94  02/03/22 (!) 142/92  07/01/21 134/84    Lab results reviewed with patient  from end of August   ASSESSMENT AND PLAN:  Discussed the following assessment and plan:    ICD-10-CM   1. Visit for preventive health examination  Z00.00     2. Medication management  Z79.899     3. ADHD, predominantly inattentive type  F90.0     4. Insulin resistance  E88.819     5. BMI 40.0-44.9, adult ([place])  Z68.41     6. Prediabetes  R73.03     7. Need for tetanus, diphtheria, and acellular pertussis (Tdap) vaccine  Z23 Tdap vaccine greater than or equal to 7yo IM    8. Elevated blood pressure reading  R03.0     Attention to lifestyle get blood pressure  monitor check twice daily readings for at least 3 to 5 days send in monitor over time if persistently elevated despite lifestyle and short time may add medication.  Contact pulmonary team that is treating her obstructive sleep apnea and advisability of mouth pieces or other interventions to help   She is prediabetic we discussed weight management and the newer medications including semaglutide and will Mounjaro. She would be a candidate for [facility] however there may be an insurance barrier.  She will look into it and wait until after the first of the year and let us know if she is interested in a prescription. Refilled sertraline today discussed how to go down to lower dose if she wishes Continue the Concerta generic 36 mg refill when due. Plan follow-up visit 4 to 6 months depending. She will discuss potential for ablation with her GYN Return in about 4 months (around 08/26/2022) for med check or depending if we start  a weight managment medication.  Patient Care Team: Nelsie Domino, Standley Brooking, MD as PCP - General (Internal Medicine) Jettie Booze, MD as PCP - Cardiology (Cardiology) Servando Salina, MD as Consulting Physician (Obstetrics and Gynecology) Patient Instructions  Good to see you today. Attention to  lifestyle intervention healthy eating and activity.  Will refill  sertraline today  can try alternating 25/50 if you wish .  Talks with your gyne about ablation .  Mancel Parsons would be a choice for you since you have re diabetes .  IF BP  remains up may consider medication   look at [facility] eating to lower bP Take blood pressure readings twice a day for 3-5 days and record .     Take 2 -3 readings at each sitting .   Can send in readings  by My Chart.     Before checking your blood pressure make sure: You are seated and quite for 5 min before checking Feet are flat on the floor Siting in chair with your back supported straight up and down Arm resting on table or arm of chair at  heart level Bladder is empty You have NOT had caffeine or tobacco within the last 30 min  [email or web address]   Marjorie Deprey K. Duha Abair M.D.

## 2022-06-18 ENCOUNTER — Encounter: Payer: Self-pay | Admitting: Pulmonary Disease

## 2022-06-18 ENCOUNTER — Ambulatory Visit: Payer: Managed Care, Other (non HMO) | Admitting: Pulmonary Disease

## 2022-06-18 VITALS — BP 144/82 | HR 76 | Temp 98.3°F | Ht 69.0 in | Wt 275.8 lb

## 2022-06-18 DIAGNOSIS — G4733 Obstructive sleep apnea (adult) (pediatric): Secondary | ICD-10-CM

## 2022-06-18 NOTE — Patient Instructions (Signed)
Referral to dentist for mild obstructive sleep apnea  You may need a repeat sleep study -We will put in for request for sleep study and if not needed, may be canceled  Discussed with your dentist about an oral device for sleep apnea  Weight loss efforts  Tentative follow-up in 3 to 4 months

## 2022-06-18 NOTE — Progress Notes (Signed)
Sharon Myers    [id number]    Oct 16, 1970  Primary Care Physician:Panosh, Standley Brooking, MD  Referring Physician: Burnis Medin, MD [place],   [id number]  Chief complaint:   Past history of mild obstructive sleep apnea Considering an oral device for treatment  HPI:  Patient with a past history of mild obstructive sleep apnea Did use CPAP therapy for a while, she found it followed to some It did help for a little while but then was not able to tolerate it  She has recurrence of symptoms and will be interested in using an oral device for treatment  Usually goes to bed about 11, wakes up at 6 AM Wakes up feeling like she is at a decent nights rest  No caffeinated beverages in the morning  She is prediabetic Borderline blood pressures  Never smoker  Weight has been about the same  Got away from using CPAP when she was able to lose about 40 pounds, has since gained some of the weight back  She will be interested in using an oral device, currently uses a dental guard  Outpatient Encounter Medications as of 06/18/2022  Medication Sig   MAGNESIUM PO Take by mouth daily. Magnesium Oxide   methylphenidate (CONCERTA) 36 MG PO CR tablet Take 1 tablet (36 mg total) by mouth daily.   sertraline (ZOLOFT) 50 MG tablet Take 1 tablet (50 mg total) by mouth at bedtime. Taking 50 mg   VITAMIN D PO Take by mouth daily.   No facility-administered encounter medications on file as of 06/18/2022.    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 Kirby Funk SURGERY     hirsch   SKIN GRAFT      Family History  Problem Relation Age of Onset   Cancer Mother        rare blood cancer   Stroke Mother    Heart failure Mother    Hypertension Father    Nephrolithiasis Father    Diabetes Father    COPD Father    Cancer Father        kidney   Hyperlipidemia Father    Sudden death Father    Sleep apnea Father    Alcoholism Father    Obesity Father    Thyroid disease Maternal Aunt    Arthritis Maternal Grandmother        rheumatoid   Heart disease Maternal Grandfather    Cancer Maternal Grandfather        stomach   Cancer Paternal Grandfather  stomach ca    Social History   Socioeconomic History   Marital status: Married    Spouse name: [name]   Number of children: 2   Years of education: Not on file   Highest education level: Not on file  Occupational History   Occupation: Work from home  Tobacco Use   Smoking status: Never   Smokeless tobacco: Never  Substance and Sexual Activity   Alcohol use: Yes    Comment: occasionally   Drug use: No   Sexual activity: Yes    Comment: tubal if CS  Other Topics Concern   Not on file  Social History Narrative   Usually receives 6 hours of sleep at night   4 people living in the home. No pets   Married 2 young children   Bachelors degree [employer] 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      Originally from [place] increase her area for 20 years   G4P2   Social etoh no tobacco no ets FA    Social Determinants of Radio broadcast assistant Strain: Not on file  Food Insecurity: Not on file  Transportation Needs: Not on file  Physical Activity: Not on file  Stress: Not on file  Social Connections: Not on file  Intimate Partner Violence: Not on file     Review of Systems  Respiratory:  Positive for apnea.   Psychiatric/Behavioral:  Positive for sleep disturbance.     Vitals:   06/18/22 0944  BP: (!) 144/82  Pulse: 76  Temp: 98.3 F (36.8 C)  SpO2: 97%     Physical Exam Constitutional:      Appearance: She is obese.  HENT:     Head: Normocephalic.     Mouth/Throat:     Mouth: Mucous membranes are moist.  Eyes:     Pupils: Pupils are equal, round, and reactive to light.  Cardiovascular:     Rate and Rhythm: Normal rate and regular rhythm.     Heart sounds: No murmur heard.    No friction rub.  Pulmonary:     Effort: No respiratory distress.     Breath sounds: No stridor. No wheezing or rhonchi.  Musculoskeletal:     Cervical back: No rigidity or tenderness.  Neurological:     Mental Status: She is alert.  Psychiatric:        Mood and Affect: Mood normal.     Data Reviewed: Sleep study from 02/13/2019 shows AHI of 7.4  Assessment:  Mild obstructive sleep apnea  Will be interested in using an oral device instead of a CPAP therapy  Class III obesity  Snoring  Plan/Recommendations: Sleep study was in 2020, may require a new sleep study to ascertain severity of sleep disordered breathing  Referral to dentist for evaluation for sleep apnea management with an oral device  Encourage weight loss measures  Tentative follow-up in 3 to 4 months  Encouraged to call with significant concerns   Sherrilyn Rist MD [facility] 06/18/2022, 10:02 AM  CC: Panosh, Standley Brooking, MD

## 2022-07-20 ENCOUNTER — Other Ambulatory Visit: Payer: Self-pay | Admitting: Internal Medicine

## 2022-07-20 MED ORDER — METHYLPHENIDATE HCL ER (OSM) 36 MG PO TBCR: 36.0000 mg | EXTENDED_RELEASE_TABLET | Freq: Every day | ORAL | 0 refills | Status: DC

## 2022-10-26 ENCOUNTER — Telehealth: Payer: Self-pay | Admitting: Pulmonary Disease

## 2022-10-26 NOTE — Telephone Encounter (Signed)
Call patient  Sleep study result  Date of study: 10/13/2022  Impression: Mild obstructive sleep apnea Mild oxygen desaturations  Recommendation: Options of treatment for mild obstructive sleep apnea will include  1.  CPAP therapy if there is significant daytime sleepiness or other comorbidities including history of CVA or cardiac disease  -If CPAP is chosen as an option of treatment auto titrating CPAP with a pressure setting of 5-15 will be appropriate  2.  Watchful waiting with emphasis on weight loss measures, sleep position modification to optimize lateral sleep, elevating the head of the bed by about 30 degrees may also help.  3.  An oral device may be fashioned for the treatment of mild sleep disordered breathing, will involve referral to dentist.  Follow-up as previously scheduled

## 2022-11-01 ENCOUNTER — Other Ambulatory Visit: Payer: Self-pay

## 2022-11-01 DIAGNOSIS — G4733 Obstructive sleep apnea (adult) (pediatric): Secondary | ICD-10-CM

## 2022-11-01 NOTE — Telephone Encounter (Signed)
Pt. Calling back spoke to [facility] but got disconnected

## 2022-11-01 NOTE — Telephone Encounter (Signed)
Spoke with patient regarding sleep study   Call patient   Sleep study result   Date of study: 10/13/2022   Impression: Mild obstructive sleep apnea Mild oxygen desaturations   Recommendation: Options of treatment for mild obstructive sleep apnea will include   1.  CPAP therapy if there is significant daytime sleepiness or other comorbidities including history of CVA or cardiac disease   -If CPAP is chosen as an option of treatment auto titrating CPAP with a pressure setting of 5-15 will be appropriate   2.  Watchful waiting with emphasis on weight loss measures, sleep position modification to optimize lateral sleep, elevating the head of the bed by about 30 degrees may also help.   3.  An oral device may be fashioned for the treatment of mild sleep disordered breathing, will involve referral to dentist.   Follow-up as previously scheduled   Patient wanted a oral device.Order was sent to [facility] office.Patient's voice was understanding. Nothing else further needed.

## 2022-12-29 NOTE — Progress Notes (Unsigned)
Chief Complaint  Patient presents with   pruritis    Pt c/o of feeling "skin is crawling" going on for 4-5 months.    Memory problem    Pt states she " struggle with memory". Trouble with remembering and "can't get word out of my mouth."   Ear Problem    Pt reports brown gooey on Left ear. Noticed couple months ago.    HPI: Millisa Gilkeson 52 y.o. come in for a number of issues   Not taking fora  few weeks.  Not use helping .   Used q tip  a lot and  and brown goo left ear please check.  Hearing dec in past year.   Memory and skin crazy for months . Periods  not fluctuated  inmaybe years .  Now lasting 4 days.  No hot flushes  and no sleep.  Mild apnea.  Just got mouth appratus.   .  ROS: See pertinent positives and negatives per HPI.  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     Family History  Problem Relation Age of Onset   Cancer Mother        rare blood cancer   Stroke Mother    Heart failure Mother    Hypertension Father    Nephrolithiasis Father    Diabetes Father    COPD Father    Cancer Father        kidney   Hyperlipidemia Father    Sudden death Father    Sleep apnea Father    Alcoholism Father    Obesity Father    Thyroid disease Maternal Aunt    Arthritis Maternal Grandmother        rheumatoid   Heart disease Maternal Grandfather    Cancer Maternal Grandfather        stomach   Cancer Paternal Grandfather        stomach ca    Social History   Socioeconomic History   Marital status: Married     Spouse name: Fish farm manager   Number of children: 2   Years of education: Not on file   Highest education level: Not on file  Occupational History   Occupation: Work from home  Tobacco Use   Smoking status: Never   Smokeless tobacco: Never  Substance and Sexual Activity   Alcohol use: Yes    Comment: occasionally   Drug use: No   Sexual activity: Yes    Comment: tubal if CS  Other Topics Concern   Not on file  Social History Narrative   Usually receives 6 hours of sleep at night   4 people living in the home. No pets   Married 2 young children   Bachelors degree [employer] 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      Originally  from [place] increase her area for 20 years   G4P2   Social etoh no tobacco no ets FA    Social Determinants of Corporate investment banker Strain: Not on file  Food Insecurity: Not on file  Transportation Needs: Not on file  Physical Activity: Not on file  Stress: Not on file  Social Connections: Not on file    Outpatient Medications Prior to Visit  Medication Sig Dispense Refill   sertraline (ZOLOFT) 25 MG tablet Take 1 tablet by mouth daily.     VITAMIN D PO Take by mouth daily.     methylphenidate (CONCERTA) 36 MG PO CR tablet Take 1 tablet (36 mg total) by mouth daily. 30 tablet 0   MAGNESIUM PO Take by mouth daily. Magnesium Oxide (Patient not taking: Reported on 12/30/2022)     sertraline (ZOLOFT) 50 MG tablet Take 1 tablet (50 mg total) by mouth at bedtime. Taking 50 mg 90 tablet 3   No facility-administered medications prior to visit.     EXAM:  BP (!) 150/90 (BP Location: Left Arm, Cuff Size: Large)   Pulse 66   Temp 98.1 F (36.7 C) (Oral)   Ht 5\' 9"  (1.753 m)   Wt 271 lb 3.2 oz (123 kg)   LMP 12/09/2022 (Approximate)   SpO2 97%   BMI 40.05 kg/m   Body mass index is 40.05 kg/m.  GENERAL: vitals reviewed and listed above, alert, oriented, appears well hydrated and in no acute distress HEENT: atraumatic, conjunctiva  clear, no  obvious abnormalities on inspection of external nose and ears left eac 2+ wax  tm intact  flushed by cma  no pain OP : no lesion edema or exudate  NECK: no obvious masses on inspection thyroid palpable no nodules  LUNGS: clear to auscultation bilaterally, no wheezes, rales or rhonchi, good air movement CV: HRRR, no clubbing cyanosis or  peripheral edema nl cap refill  MS: moves all extremities without noticeable focal  abnormality PSYCH: pleasant and cooperative, no obvious depression or anxiety Nl speech  neuro non focal   oreinted   memory survey not done  Lab Results  Component Value Date   WBC 8.1 12/30/2022   HGB 14.3 12/30/2022   HCT 42.6 12/30/2022   PLT 211.0 12/30/2022   GLUCOSE 112 (H) 12/30/2022   CHOL 181 12/30/2022   TRIG 97.0 12/30/2022   HDL 49.90 12/30/2022   LDLCALC 112 (H) 12/30/2022   ALT 16 12/30/2022   AST 13 12/30/2022   NA 140 12/30/2022   K 4.3 12/30/2022   CL 104 12/30/2022   CREATININE 0.80 12/30/2022   BUN 14 12/30/2022   CO2 29 12/30/2022   TSH 1.67 12/30/2022   HGBA1C 5.9 12/30/2022   BP Readings from Last 3 Encounters:  12/30/22 (!) 150/90  06/18/22 (!) 144/82  04/27/22 (!) 144/94    ASSESSMENT AND PLAN:  Discussed the following assessment and plan:  Medication management - Plan: Basic metabolic panel, CBC with Differential/Platelet, Hemoglobin A1c, Hepatic function panel, Lipid panel, TSH, T4, free, Vitamin B12  Decreased hearing, unspecified laterality - or attention to hearing ? if from adhd CAPD or hearing or all of the above - Plan: Basic metabolic panel, CBC with Differential/Platelet, Hemoglobin A1c, Hepatic function panel, Lipid panel, TSH, T4, free, Vitamin B12  Complaints of memory disturbance - multifactorial concern if underlying other neuro issues attention hearing porcesseing etc - Plan: Basic metabolic panel, CBC with Differential/Platelet, Hemoglobin A1c, Hepatic function panel, Lipid panel, TSH,  T4, free, Vitamin B12  ADHD,  predominantly inattentive type - for now trial higher dosing or change - Plan: Basic metabolic panel, CBC with Differential/Platelet, Hemoglobin A1c, Hepatic function panel, Lipid panel, TSH, T4, free, Vitamin B12  Elevated blood pressure reading - lsi  begin medication and fu 1-2 mos  Excessive ear wax, left Get hearing check  processing ? Other  avoid q tips Inc dose concerta for now Ad antiht med for now Plan referral reevaluation  neuro or neuro psychch . Update lab monitoring  Periods nl but perimenopausal age could be related  She has addressed mild osa and not felt related  -Patient advised to return or notify health care team  if  new concerns arise.  Patient Instructions  Get lab at   [facility]. I advise we get better hearing evaluation.   Will do referral to [facility] s and h and but you can also call to see if insurance covered  Will do a referral depending either neurology or neuropsychology for evaluation.  In interim consider trying  diiffent add med or inc dosing.  I will send in for increase dose  concerta 54 as a trial  Bp is continued kreeping up and will send in medication to begin    Continue rx the mild sleep apnea .      Neta Mends. Tuwana Kapaun M.D.

## 2022-12-30 ENCOUNTER — Other Ambulatory Visit (INDEPENDENT_AMBULATORY_CARE_PROVIDER_SITE_OTHER): Payer: Managed Care, Other (non HMO)

## 2022-12-30 ENCOUNTER — Encounter: Payer: Self-pay | Admitting: Internal Medicine

## 2022-12-30 ENCOUNTER — Ambulatory Visit: Payer: Managed Care, Other (non HMO) | Admitting: Internal Medicine

## 2022-12-30 VITALS — BP 150/90 | HR 66 | Temp 98.1°F | Ht 69.0 in | Wt 271.2 lb

## 2022-12-30 DIAGNOSIS — F900 Attention-deficit hyperactivity disorder, predominantly inattentive type: Secondary | ICD-10-CM

## 2022-12-30 DIAGNOSIS — H9190 Unspecified hearing loss, unspecified ear: Secondary | ICD-10-CM

## 2022-12-30 DIAGNOSIS — Z79899 Other long term (current) drug therapy: Secondary | ICD-10-CM

## 2022-12-30 DIAGNOSIS — R413 Other amnesia: Secondary | ICD-10-CM

## 2022-12-30 DIAGNOSIS — H6122 Impacted cerumen, left ear: Secondary | ICD-10-CM

## 2022-12-30 DIAGNOSIS — R030 Elevated blood-pressure reading, without diagnosis of hypertension: Secondary | ICD-10-CM

## 2022-12-30 LAB — CBC WITH DIFFERENTIAL/PLATELET
Basophils Absolute: 0 10*3/uL (ref 0.0–0.1)
Basophils Relative: 0.6 % (ref 0.0–3.0)
Eosinophils Absolute: 0.1 10*3/uL (ref 0.0–0.7)
Eosinophils Relative: 1.8 % (ref 0.0–5.0)
HCT: 42.6 % (ref 36.0–46.0)
Hemoglobin: 14.3 g/dL (ref 12.0–15.0)
Lymphocytes Relative: 33.1 % (ref 12.0–46.0)
Lymphs Abs: 2.7 10*3/uL (ref 0.7–4.0)
MCHC: 33.7 g/dL (ref 30.0–36.0)
MCV: 85.7 fl (ref 78.0–100.0)
Monocytes Absolute: 0.7 10*3/uL (ref 0.1–1.0)
Monocytes Relative: 8.1 % (ref 3.0–12.0)
Neutro Abs: 4.6 10*3/uL (ref 1.4–7.7)
Neutrophils Relative %: 56.4 % (ref 43.0–77.0)
Platelets: 211 10*3/uL (ref 150.0–400.0)
RBC: 4.97 Mil/uL (ref 3.87–5.11)
RDW: 13.7 % (ref 11.5–15.5)
WBC: 8.1 10*3/uL (ref 4.0–10.5)

## 2022-12-30 LAB — BASIC METABOLIC PANEL
BUN: 14 mg/dL (ref 6–23)
CO2: 29 mEq/L (ref 19–32)
Calcium: 9.7 mg/dL (ref 8.4–10.5)
Chloride: 104 mEq/L (ref 96–112)
Creatinine, Ser: 0.8 mg/dL (ref 0.40–1.20)
GFR: 84.69 mL/min (ref >60.00)
Glucose, Bld: 112 mg/dL — ABNORMAL HIGH (ref 70–99)
Potassium: 4.3 mEq/L (ref 3.5–5.1)
Sodium: 140 mEq/L (ref 135–145)

## 2022-12-30 LAB — LIPID PANEL
Cholesterol: 181 mg/dL (ref 0–200)
HDL: 49.9 mg/dL (ref >39.00)
LDL Cholesterol: 112 mg/dL — ABNORMAL HIGH (ref 0–99)
NonHDL: 131.24
Total CHOL/HDL Ratio: 4
Triglycerides: 97 mg/dL (ref 0.0–149.0)
VLDL: 19.4 mg/dL (ref 0.0–40.0)

## 2022-12-30 LAB — HEPATIC FUNCTION PANEL
ALT: 16 U/L (ref 0–35)
AST: 13 U/L (ref 0–37)
Albumin: 4.2 g/dL (ref 3.5–5.2)
Alkaline Phosphatase: 78 U/L (ref 39–117)
Bilirubin, Direct: 0.1 mg/dL (ref 0.0–0.3)
Total Bilirubin: 0.6 mg/dL (ref 0.2–1.2)
Total Protein: 7.1 g/dL (ref 6.0–8.3)

## 2022-12-30 LAB — HEMOGLOBIN A1C: Hgb A1c MFr Bld: 5.9 % (ref 4.6–6.5)

## 2022-12-30 LAB — T4, FREE: Free T4: 0.88 ng/dL (ref 0.60–1.60)

## 2022-12-30 LAB — VITAMIN B12: Vitamin B-12: 221 pg/mL (ref 211–911)

## 2022-12-30 LAB — TSH: TSH: 1.67 u[IU]/mL (ref 0.35–5.50)

## 2022-12-30 MED ORDER — VALSARTAN 80 MG PO TABS: 80.0000 mg | ORAL_TABLET | Freq: Every day | ORAL | 1 refills | Status: DC

## 2022-12-30 MED ORDER — METHYLPHENIDATE HCL ER (OSM) 54 MG PO TBCR: 54.0000 mg | EXTENDED_RELEASE_TABLET | ORAL | 0 refills | Status: DC

## 2022-12-30 NOTE — Patient Instructions (Signed)
Get lab at   [facility]. I advise we get better hearing evaluation.   Will do referral to [facility] s and h and but you can also call to see if insurance covered  Will do a referral depending either neurology or neuropsychology for evaluation.  In interim consider trying  diiffent add med or inc dosing.  I will send in for increase dose  concerta 54 as a trial  Bp is continued kreeping up and will send in medication to begin    Continue rx the mild sleep apnea .

## 2023-01-03 NOTE — Progress Notes (Signed)
Thyroid normal and  no  no diabetes  ( but sugar borderline  )

## 2023-01-12 ENCOUNTER — Encounter (INDEPENDENT_AMBULATORY_CARE_PROVIDER_SITE_OTHER): Payer: Self-pay

## 2023-02-04 ENCOUNTER — Other Ambulatory Visit: Payer: Self-pay | Admitting: Internal Medicine

## 2023-02-04 MED ORDER — METHYLPHENIDATE HCL ER (OSM) 54 MG PO TBCR: 54.0000 mg | EXTENDED_RELEASE_TABLET | ORAL | 0 refills | Status: DC

## 2023-02-10 ENCOUNTER — Ambulatory Visit: Payer: Managed Care, Other (non HMO) | Admitting: Internal Medicine

## 2023-03-03 ENCOUNTER — Encounter: Payer: Self-pay | Admitting: Internal Medicine

## 2023-03-03 ENCOUNTER — Ambulatory Visit: Payer: Managed Care, Other (non HMO) | Admitting: Internal Medicine

## 2023-03-03 VITALS — BP 114/80 | HR 76 | Temp 97.7°F | Ht 69.0 in | Wt 271.2 lb

## 2023-03-03 DIAGNOSIS — R7303 Prediabetes: Secondary | ICD-10-CM

## 2023-03-03 DIAGNOSIS — Z79899 Other long term (current) drug therapy: Secondary | ICD-10-CM

## 2023-03-03 DIAGNOSIS — H9190 Unspecified hearing loss, unspecified ear: Secondary | ICD-10-CM

## 2023-03-03 DIAGNOSIS — F900 Attention-deficit hyperactivity disorder, predominantly inattentive type: Secondary | ICD-10-CM

## 2023-03-03 DIAGNOSIS — M542 Cervicalgia: Secondary | ICD-10-CM

## 2023-03-03 DIAGNOSIS — I10 Essential (primary) hypertension: Secondary | ICD-10-CM

## 2023-03-03 DIAGNOSIS — R030 Elevated blood-pressure reading, without diagnosis of hypertension: Secondary | ICD-10-CM | POA: Diagnosis not present

## 2023-03-03 LAB — BASIC METABOLIC PANEL
BUN: 10 mg/dL (ref 6–23)
CO2: 29 mEq/L (ref 19–32)
Calcium: 9.5 mg/dL (ref 8.4–10.5)
Chloride: 103 mEq/L (ref 96–112)
Creatinine, Ser: 0.74 mg/dL (ref 0.40–1.20)
GFR: 92.89 mL/min (ref >60.00)
Glucose, Bld: 87 mg/dL (ref 70–99)
Potassium: 4.1 mEq/L (ref 3.5–5.1)
Sodium: 140 mEq/L (ref 135–145)

## 2023-03-03 NOTE — Progress Notes (Signed)
Chief Complaint  Patient presents with   Medical Management of Chronic Issues    HPI: Sharon Myers 52 y.o. come in for Chronic disease management   Add adhd try concerta 53  and maybe causes heart causing bananas may hve been stress.   Memory concerns  has upcoming appt  to evaluate   Medication BP/HT valsartan Is looking for other jobs for  less stress  getting some better   Sleep at least 6 hours  ROS: See pertinent positives and negatives per HPI.  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     Family History  Problem Relation Age of Onset   Cancer Mother        rare blood cancer   Stroke Mother    Heart failure Mother    Hypertension Father    Nephrolithiasis Father    Diabetes Father    COPD Father    Cancer Father        kidney   Hyperlipidemia Father    Sudden death Father    Sleep apnea Father    Alcoholism Father    Obesity Father    Thyroid disease Maternal Aunt    Arthritis Maternal Grandmother        rheumatoid   Heart disease Maternal Grandfather    Cancer Maternal Grandfather        stomach   Cancer Paternal Grandfather        stomach ca    Social History   Socioeconomic History   Marital status: Married    Spouse name: Fish farm manager   Number of children: 2   Years of education: Not on file   Highest education level: Not on file  Occupational History   Occupation: Work from home  Tobacco Use   Smoking status: Never   Smokeless tobacco: Never  Substance and Sexual Activity   Alcohol use:  Yes    Comment: occasionally   Drug use: No   Sexual activity: Yes    Comment: tubal if CS  Other Topics Concern   Not on file  Social History Narrative   Usually receives 6 hours of sleep at night   4 people living in the home. No pets   Married 2 young children   Bachelors degree [employer] 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      Originally from [place] increase her area for 20 years   G4P2   Social etoh no tobacco no ets FA    Social Determinants of Corporate investment banker Strain: Not on file  Food Insecurity: Not on file  Transportation Needs: Not on file  Physical Activity: Not on file  Stress: Not on file  Social Connections: Not on file    Outpatient Medications Prior to Visit  Medication Sig Dispense Refill  methylphenidate (CONCERTA) 54 MG PO CR tablet Take 1 tablet (54 mg total) by mouth every morning. Dosage change 30 tablet 0   OVER THE COUNTER MEDICATION CALM Vitality supplement. Has magnesium citrate in it.     sertraline (ZOLOFT) 25 MG tablet Take 1 tablet by mouth daily.     valsartan (DIOVAN) 80 MG tablet Take 1 tablet (80 mg total) by mouth daily. For high blood pressure 90 tablet 1   MAGNESIUM PO Take by mouth daily. Magnesium Oxide (Patient not taking: Reported on 12/30/2022)     VITAMIN D PO Take by mouth daily. (Patient not taking: Reported on 03/03/2023)     No facility-administered medications prior to visit.     EXAM:  BP 114/80 (BP Location: Left Arm, Patient Position: Sitting, Cuff Size: Large)   Pulse 76   Temp 97.7 F (36.5 C) (Oral)   Ht 5\' 9"  (1.753 m)   Wt 271 lb 3.2 oz (123 kg)   LMP 02/17/2023 (Approximate)   SpO2 97%   BMI 40.05 kg/m   Body mass index is 40.05 kg/m.  GENERAL: vitals reviewed and listed above, alert, oriented, appears well hydrated and in no acute distress HEENT: atraumatic, conjunctiva  clear, no obvious abnormalities on inspection of external nose and ears NECK: no obvious masses on inspection palpation   LUNGS: clear to auscultation bilaterally, no wheezes, rales or rhonchi, good air movement CV: HRRR, no clubbing cyanosis or  peripheral edema nl cap refill  MS: moves all extremities without noticeable focal  abnormality PSYCH: pleasant and cooperative, no obvious depression or anxiety Lab Results  Component Value Date   WBC 8.1 12/30/2022   HGB 14.3 12/30/2022   HCT 42.6 12/30/2022   PLT 211.0 12/30/2022   GLUCOSE 87 03/03/2023   CHOL 181 12/30/2022   TRIG 97.0 12/30/2022   HDL 49.90 12/30/2022   LDLCALC 112 (H) 12/30/2022   ALT 16 12/30/2022   AST 13 12/30/2022   NA 140 03/03/2023   K 4.1 03/03/2023   CL 103 03/03/2023   CREATININE 0.74 03/03/2023   BUN 10 03/03/2023   CO2 29 03/03/2023   TSH 1.67 12/30/2022   HGBA1C 5.9 12/30/2022   BP Readings from Last 3 Encounters:  03/03/23 114/80  12/30/22 (!) 150/90  06/18/22 (!) 144/82    ASSESSMENT AND PLAN:  Discussed the following assessment and plan:  Hypertension, unspecified type - improved on vlasartan 80 - Plan: Basic metabolic panel  Neck pain shoulder exercise induced - atypical but exercise induced and patient queries about poss stress test. - Plan: Ambulatory referral to Cardiology  Elevated blood pressure reading  Medication management - Plan: Basic metabolic panel  Prediabetes - Plan: Basic metabolic panel  ADHD, predominantly inattentive type  Decreased hearing, unspecified laterality - plan for evaluation depnding on insurance coverage Atypical exercise induced neck shoulder sx  aska bout stress testing  also has some knee issues . Will  forward back to cardiology about advisability and  which imaging would be best in her situation.  Continue bp control Keep neuro appt  For now ok to stay on concerta that she has  either the 36 or 54  depending. Plan fu after all evaulations  ? 2-3 months . Or as indicated .  -Patient advised to return or notify health care team  if  new concerns arise.  Patient  Instructions  Await the neurology consult. I will re refer back to cardiology.   About the exercise related sx  we discussed.  Contact us for refills .  Wt Readings from Last 3 Encounters:  03/03/23 271 lb 3.2 oz (123 kg)  12/30/22 271 lb 3.2 oz (123 kg)  06/18/22 275 lb 12.8 oz (125.1 kg)   Update chemistry lab today    Neta Mends. Durward Matranga M.D.

## 2023-03-03 NOTE — Patient Instructions (Addendum)
Await the neurology consult. I will re refer back to cardiology.   About the exercise related sx  we discussed.  Contact us for refills .  Wt Readings from Last 3 Encounters:  03/03/23 271 lb 3.2 oz (123 kg)  12/30/22 271 lb 3.2 oz (123 kg)  06/18/22 275 lb 12.8 oz (125.1 kg)   Update chemistry lab today

## 2023-03-05 NOTE — Progress Notes (Signed)
Potassium and BG is normal  on vasartan

## 2023-03-06 NOTE — Addendum Note (Signed)
Addended byMadelin Headings on: 03/06/2023 09:41 PM   Modules accepted: Orders

## 2023-03-23 ENCOUNTER — Telehealth: Payer: Self-pay | Admitting: Internal Medicine

## 2023-03-23 ENCOUNTER — Encounter: Payer: Self-pay | Admitting: Family Medicine

## 2023-03-23 ENCOUNTER — Ambulatory Visit: Payer: Managed Care, Other (non HMO) | Admitting: Family Medicine

## 2023-03-23 VITALS — BP 124/86 | HR 77 | Temp 98.9°F | Ht 69.0 in | Wt 269.6 lb

## 2023-03-23 DIAGNOSIS — R300 Dysuria: Secondary | ICD-10-CM

## 2023-03-23 DIAGNOSIS — R3989 Other symptoms and signs involving the genitourinary system: Secondary | ICD-10-CM

## 2023-03-23 LAB — POC URINALSYSI DIPSTICK (AUTOMATED)
Bilirubin, UA: NEGATIVE
Blood, UA: 10
Glucose, UA: NEGATIVE
Ketones, UA: NEGATIVE
Leukocytes, UA: NEGATIVE
Nitrite, UA: POSITIVE
Protein, UA: POSITIVE — AB
Spec Grav, UA: 1.025 (ref 1.010–1.025)
Urobilinogen, UA: 0.2 U/dL
pH, UA: 5.5 (ref 5.0–8.0)

## 2023-03-23 MED ORDER — NITROFURANTOIN MONOHYD MACRO 100 MG PO CAPS
100.0000 mg | ORAL_CAPSULE | Freq: Two times a day (BID) | ORAL | 0 refills | Status: AC
Start: 2023-03-23 — End: 2023-03-30

## 2023-03-23 NOTE — Progress Notes (Signed)
Established Patient Office Visit   Subjective  Patient ID: Sharon Myers, female    DOB: 1970/08/28  Age: 52 y.o. MRN: [id number]  Chief Complaint  Patient presents with   Urinary Tract Infection    Started 2 weeks ago, burning, odor and blood, with some back pain     Patient is a 52 year old female followed by Dr. Fabian Sharp and seen for ongoing concern.  Patient endorses urinary symptoms such as dysuria, odor, hematuria x 2 weeks.  Noticed some back pain when symptoms started.  Urine has been cloudy.  Patient concerned as she is going out of town at the end of the week.  Patient denies fever, chills, nausea, vomiting, constipation.  Urinary Tract Infection       ROS Negative unless stated above    Objective:     BP 124/86 (BP Location: Left Arm, Patient Position: Sitting, Cuff Size: Large)   Pulse 77   Temp 98.9 F (37.2 C) (Oral)   Ht 5\' 9"  (1.753 m)   Wt 269 lb 9.6 oz (122.3 kg)   LMP 03/13/2023 (Approximate)   SpO2 96%   BMI 39.81 kg/m    Physical Exam Constitutional:      General: She is not in acute distress.    Appearance: Normal appearance.  HENT:     Head: Normocephalic and atraumatic.     Nose: Nose normal.     Mouth/Throat:     Mouth: Mucous membranes are moist.  Cardiovascular:     Rate and Rhythm: Normal rate and regular rhythm.     Heart sounds: Normal heart sounds. No murmur heard.    No gallop.  Pulmonary:     Effort: Pulmonary effort is normal. No respiratory distress.     Breath sounds: Normal breath sounds. No wheezing, rhonchi or rales.  Abdominal:     General: Bowel sounds are normal. There is no distension.     Palpations: Abdomen is soft.     Tenderness: There is no abdominal tenderness. There is no right CVA tenderness or left CVA tenderness.  Skin:    General: Skin is warm and dry.  Neurological:     Mental Status: She is alert and oriented to person, place, and time.      Results for orders placed or performed in visit on  03/23/23  POCT Urinalysis Dipstick (Automated)  Result Value Ref Range   Color, UA yellow    Clarity, UA clear    Glucose, UA Negative Negative   Bilirubin, UA neg    Ketones, UA neg    Spec Grav, UA 1.025 1.010 - 1.025   Blood, UA 10    pH, UA 5.5 5.0 - 8.0   Protein, UA Positive (A) Negative   Urobilinogen, UA 0.2 0.2 or 1.0 E.U./dL   Nitrite, UA pos    Leukocytes, UA Negative Negative      Assessment & Plan:  Suspected UTI -     Nitrofurantoin Monohyd Macro; Take 1 capsule (100 mg total) by mouth 2 (two) times daily for 7 days.  Dispense: 14 capsule; Refill: 0 -     Urine Culture  Dysuria -     POCT Urinalysis Dipstick (Automated) -     Urine Culture  Urinary symptoms x 2 weeks.  POC UA blood and nitrites.  Start ABX.  Pt increase p.o. intake of water and fluids.  Obtain urine culture.  Adjust antibiotic if needed based on culture results.  Return if symptoms worsen or fail to  improve.   Deeann Saint, MD

## 2023-03-23 NOTE — Telephone Encounter (Signed)
ERROR

## 2023-03-25 LAB — URINE CULTURE
MICRO NUMBER:: 15572814
SPECIMEN QUALITY:: ADEQUATE

## 2023-05-19 ENCOUNTER — Ambulatory Visit: Payer: Managed Care, Other (non HMO) | Admitting: Neurology

## 2023-05-20 LAB — HM MAMMOGRAPHY

## 2023-05-23 ENCOUNTER — Encounter: Payer: Self-pay | Admitting: Internal Medicine

## 2023-06-23 ENCOUNTER — Telehealth: Payer: Self-pay | Admitting: Neurology

## 2023-06-23 NOTE — Telephone Encounter (Signed)
 Pt request if appointment could be changed to 08/01/23. Informed patient date was not available, but can call back to check on availability for that day. Patient verbalized understand.

## 2023-06-26 ENCOUNTER — Other Ambulatory Visit: Payer: Self-pay | Admitting: Internal Medicine

## 2023-07-10 NOTE — Progress Notes (Unsigned)
Cardiology Office Note   Date:  07/12/2023   ID:  Sharon Myers, DOB [birth year], MRN [id number]  PCP:  Madelin Headings, MD  Cardiologist:   Dietrich Pates, MD   Pt presents for follow up of cardiac risk factors     History of Present Illness: Sharon Myers is a 53 y.o. female with a history of palpitations  Previously seen by Ebbie Latus in 2022  Monitor showed SR   Average HR 71 bpm  Rare PACs, PVCs   14 runs SVT   Longest 23 sec at 131 bpm  Recomm trial fo metoprolol 12.5 bid prn Calcium score in 2022 was 0  The pt says she is  no longer experiencing skips / palpitations   She is  concerned about her heart overall    She does not exercise  Working in yard in the fall developed a tense sensation in chest    Has pulled back on activity   Br:  Scraps from dinner  Orange or apple juice Lunch  Home   Sandwich, chips, soda  Dr Reino [place] mikes Dinner:  Milk   Meat and veggies    Snacks  Cookies, cashews, carrots       Current Meds  Medication Sig   methylphenidate (CONCERTA) 54 MG PO CR tablet Take 1 tablet (54 mg total) by mouth every morning. Dosage change   OVER THE COUNTER MEDICATION CALM Vitality supplement. Has magnesium citrate in it.   valsartan (DIOVAN) 80 MG tablet TAKE 1 TABLET (80 MG TOTAL) BY MOUTH DAILY. FOR HIGH BLOOD PRESSURE     Allergies:   Patient has no known allergies.   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 Posey Pronto SURGERY     hirsch   SKIN GRAFT       Social History:  The patient  reports that she has never smoked. She has never used smokeless tobacco. She reports current alcohol use. She reports that she does not use drugs.   Family History:  The patient's family history includes Alcoholism in her father; Arthritis in her maternal grandmother; COPD in her father; Cancer in her father, maternal grandfather, mother, and paternal grandfather; Diabetes in her father; Heart disease in her maternal grandfather; Heart failure in her mother; Hyperlipidemia in her father; Hypertension in her father; Nephrolithiasis in her father; Obesity in her father; Sleep apnea in her father; Stroke in her mother; Sudden death in her father; Thyroid disease in her maternal aunt.    ROS:  Please see the history of present illness. All other systems are reviewed and  Negative to the above problem except as noted.    PHYSICAL  EXAM: VS:  BP 108/78   Pulse 80   Ht 5\' 9"  (1.753 m)   Wt 271 lb 9.6 oz (123.2 kg)   SpO2 97%   BMI 40.11 kg/m   GEN: Well nourished, well developed, in no acute distress  HEENT: normal  Neck: no JVD, no carotid bruits Cardiac: RRR; no murmurs,  No LE edema  Respiratory:  clear to auscultation bilaterally GI: soft, nontender, no masses  No hepatomegaly  MS: no deformity Moving all extremities   S  EKG:  EKG is ordered today.  Sinus rhythm  80 bpm   Lipid Panel    Component Value Date/Time   CHOL 181 12/30/2022 0959   CHOL 142 08/16/2018 1257   TRIG 97.0 12/30/2022 0959   HDL 49.90 12/30/2022 0959   HDL 49 08/16/2018 1257   CHOLHDL 4 12/30/2022 0959   VLDL 19.4 12/30/2022 0959   LDLCALC 112 (H) 12/30/2022 0959   LDLCALC 83 08/16/2018 1257      Wt Readings from Last 3 Encounters:  07/12/23 271 lb 9.6 oz (123.2 kg)  03/23/23 269 lb 9.6 oz (122.3 kg)  03/03/23 271 lb 3.2 oz (123 kg)      ASSESSMENT AND PLAN:  1  Palpitations   Pt denies    2  Chest "tenseness"   I am not convinced angina    Ca score 0 in 2022  Pt 52    Would recomm echo (given mothers hx of CHF, though she also had rare skin condition which could  have CHF associated)    In addition I would set pt up for GXT to eval exercise tolerance, EKG  3  Metabolics  Pt can do much better   She did go to weight and wellness in the [facility]   Lost 40 Lbs   Ate a lot of protein    Gave her multiple sources for information (GOOD ENERGY, LEVELS, Loyal Gambler, Lafonda Mosses)   Good time to make changes     4   HTN  Excellent control  Will check lipomed, Lpa and Apo B     Current medicines are reviewed at length with the patient today.  The patient does not have concerns regarding medicines.  Signed, Dietrich Pates, MD  07/12/2023 2:46 PM    [facility] [address] [place], [place], [place]  [address] Phone: [phone]; Fax: [phone]

## 2023-07-12 ENCOUNTER — Encounter: Payer: Self-pay | Admitting: Internal Medicine

## 2023-07-12 ENCOUNTER — Ambulatory Visit: Payer: Managed Care, Other (non HMO) | Attending: Internal Medicine | Admitting: Internal Medicine

## 2023-07-12 VITALS — BP 108/78 | HR 80 | Ht 69.0 in | Wt 271.6 lb

## 2023-07-12 DIAGNOSIS — I2721 Secondary pulmonary arterial hypertension: Secondary | ICD-10-CM | POA: Diagnosis not present

## 2023-07-12 NOTE — Patient Instructions (Addendum)
Medication Instructions:   *If you need a refill on your cardiac medications before your next appointment, please call your pharmacy*   Lab Work:  On the day of your Echo... go to [facility] HGBA1C, NMR, APO B, LIPO A  If you have labs (blood work) drawn today and your tests are completely normal, you will receive your results only by: MyChart Message (if you have MyChart) OR A paper copy in the mail If you have any lab test that is abnormal or we need to change your treatment, we will call you to review the results.   Testing/Procedures:    Your physician has requested that you have an echocardiogram. Echocardiography is a painless test that uses sound waves to create images of your heart. It provides your doctor with information about the size and shape of your heart and how well your heart's chambers and valves are working. This procedure takes approximately one hour. There are no restrictions for this procedure. Please do NOT wear cologne, perfume, aftershave, or lotions (deodorant is allowed). Please arrive 15 minutes prior to your appointment time.  Please note: We ask at that you not bring children with you during ultrasound (echo/ vascular) testing. Due to room size and safety concerns, children are not allowed in the ultrasound rooms during exams. Our front office staff cannot provide observation of children in our lobby area while testing is being conducted. An adult accompanying a patient to their appointment will only be allowed in the ultrasound room at the discretion of the ultrasound technician under special circumstances. We apologize for any inconvenience.   Your physician has requested that you have an exercise tolerance test. For further information please visit [email or web address] Please also follow instruction sheet, as given.     Follow-Up: At [facility], you and your health needs are our priority.  As part of our continuing mission to provide you with  exceptional heart care, we have created designated Provider Care Teams.  These Care Teams include your primary Cardiologist (physician) and Advanced Practice Providers (APPs -  Physician Assistants and Nurse Practitioners) who all work together to provide you with the care you need, when you need it.  We recommend signing up for the patient portal called "MyChart".  Sign up information is provided on this After Visit Summary.  MyChart is used to connect with patients for Virtual Visits (Telemedicine).  Patients are able to view lab/test results, encounter notes, upcoming appointments, etc.  Non-urgent messages can be sent to your provider as well.   To learn more about what you can do with MyChart, go to [email or web address].    Your next appointment:   1 year(s) with DR Dietrich Pates       Other Instructions

## 2023-07-27 ENCOUNTER — Encounter: Payer: Self-pay | Admitting: Neurology

## 2023-07-27 ENCOUNTER — Ambulatory Visit: Payer: Managed Care, Other (non HMO) | Admitting: Neurology

## 2023-07-27 ENCOUNTER — Other Ambulatory Visit: Payer: Self-pay | Admitting: Internal Medicine

## 2023-07-27 VITALS — BP 146/89 | HR 69 | Ht 69.0 in | Wt 272.5 lb

## 2023-07-27 DIAGNOSIS — R419 Unspecified symptoms and signs involving cognitive functions and awareness: Secondary | ICD-10-CM | POA: Diagnosis not present

## 2023-07-27 NOTE — Telephone Encounter (Signed)
Copied from CRM [id number]. Topic: Clinical - Medication Refill >> Jul 27, 2023 10:25 AM Kathryne Eriksson wrote: Most Recent Primary Care Visit:  Provider: Deeann Saint  Department: [facility]  Visit Type: OFFICE VISIT  Date: 03/23/2023  Medication: methylphenidate (CONCERTA) 54 MG PO CR tablet  Has the patient contacted their pharmacy? No (Agent: If no, request that the patient contact the pharmacy for the refill. If patient does not wish to contact the pharmacy document the reason why and proceed with request.) (Agent: If yes, when and what did the pharmacy advise?)  Is this the correct pharmacy for this prescription? Yes If no, delete pharmacy and type the correct one.  This is the patient's preferred pharmacy:  [id number] - [place], [place] - [address] [place] [address] Phone: [phone] Fax: [phone]   Has the prescription been filled recently? No  Is the patient out of the medication? Yes  Has the patient been seen for an appointment in the last year OR does the patient have an upcoming appointment? Yes  Can we respond through MyChart? Yes  Agent: Please be advised that Rx refills may take up to 3 business days. We ask that you follow-up with your pharmacy.

## 2023-07-27 NOTE — Telephone Encounter (Signed)
Last Fill: 02/04/23  Last OV: 03/23/23 Next OV: None Scheduled  Routing to provider for review/authorization.

## 2023-07-27 NOTE — Progress Notes (Signed)
[facility]  PATIENT: Sharon Myers DOB: 02-02-1971  REQUESTING CLINICIAN: Panosh, Neta Mends, MD HISTORY FROM: Patient  REASON FOR VISIT: Memory concerns    HISTORICAL  CHIEF COMPLAINT:  Chief Complaint  Patient presents with   New Patient (Initial Visit)    Rm13, alone,  NP internal referral for Memory concerns: aphasia, word finding difficulty, moca score was 28, pt stated trouble focusing    HISTORY OF PRESENT ILLNESS:  This is a 53 year old woman past medical history of sleep apnea, depression, hypertension, and obesity who is presenting with memory concern.  Patient tells me that she cannot remember like before, she will forget recent conversation, and she cannot remember as before.  On top of that she complaints of word finding difficulty.  She can visualize the words but the words are just not coming out. This has affected her work to the point that she was removed from a project that she was working on. She is at home with the family, independent all actives of daily living but states sometimes she will leave the stove on by accident and sometimes she will forget the laundry in the dryer for about 4 days.  She tells me that during her lifetime, she has difficulty with attention, her grades did suffer while in school.  She was evaluated recently and put on Concerta but she is not taking the medication daily.   TBI:  No past history of TBI Stroke:   no past history of stroke Seizures:   no past history of seizures Sleep: Yes, not using dental device or CPAP Mood: Yes, on Zoloft  Family history of Dementia: None   Functional status: independent in all ADLs and IADLs Patient lives with family. Cooking: yes  Cleaning: yes Shopping: husband Bathing: no issues Toileting: no issues  Driving: no issues  Bills: no issues  Medications: No issues Ever left the stove on by accident?: Yes Forget how to use items around the house?: denies  Getting lost going  to familiar places?: denies  Forgetting loved ones names?: denies  Word finding difficulty? Yes  Sleep: No issues   OTHER MEDICAL CONDITIONS: Sleep apnea, obesity, hypertension, Depression    REVIEW OF SYSTEMS: Full 14 system review of systems performed and negative with exception of:  As noted in the HPI   ALLERGIES: No Known Allergies  HOME MEDICATIONS: Outpatient Medications Prior to Visit  Medication Sig Dispense Refill   methylphenidate (CONCERTA) 54 MG PO CR tablet Take 1 tablet (54 mg total) by mouth every morning. Dosage change 30 tablet 0   OVER THE COUNTER MEDICATION CALM Vitality supplement. Has magnesium citrate in it.     sertraline (ZOLOFT) 25 MG tablet Take 25 mg by mouth daily.     valsartan (DIOVAN) 80 MG tablet TAKE 1 TABLET (80 MG TOTAL) BY MOUTH DAILY. FOR HIGH BLOOD PRESSURE 90 tablet 1   vitamin B-12 (CYANOCOBALAMIN) 100 MCG tablet Take 100 mcg by mouth daily.     No facility-administered medications prior to visit.    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 Posey Pronto SURGERY     hirsch   SKIN GRAFT      FAMILY HISTORY: Family History  Problem Relation Age of Onset   Cancer Mother        rare blood cancer   Stroke Mother    Heart failure Mother    Hypertension Father    Nephrolithiasis Father    Diabetes Father    COPD Father     Cancer Father        kidney   Hyperlipidemia Father    Sudden death Father    Sleep apnea Father    Alcoholism Father    Obesity Father    Thyroid disease Maternal Aunt    Arthritis Maternal Grandmother        rheumatoid   Heart disease Maternal Grandfather    Cancer Maternal Grandfather        stomach   Cancer Paternal Grandfather        stomach ca    SOCIAL HISTORY: Social History   Socioeconomic History   Marital status: Married    Spouse name: Shon Baton   Number of children: 2   Years of education: Not on file   Highest education level: Bachelor's degree (e.g., BA, AB, BS)  Occupational History   Occupation: Work from home  Tobacco Use   Smoking status: Never    Passive exposure: Never   Smokeless tobacco: Never  Vaping Use   Vaping status: Never Used  Substance and Sexual Activity   Alcohol use: Yes    Alcohol/week: 1.0 standard drink of alcohol    Types: 1 Standard drinks or equivalent per week    Comment: occasionally   Drug use: No   Sexual activity: Yes    Comment: tubal if CS  Other Topics Concern   Not on file  Social History Narrative   Usually receives 6 hours of sleep at night   4 people living in the home. No pets   Married 2 young children   Bachelors degree [employer] 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      Originally from [place] increase her area for 20 years   G4P2   Social etoh no tobacco no ets FA    Social Drivers of Corporate investment banker Strain: Not on file  Food Insecurity: Not on file  Transportation Needs: Not on file  Physical Activity: Not on file  Stress: Not on file  Social Connections: Not on file  Intimate Partner Violence: Not on file    PHYSICAL EXAM  GENERAL EXAM/CONSTITUTIONAL: Vitals:  Vitals:   07/27/23 0923 07/27/23 0933  BP: (!) 160/99 (!) 146/89  Pulse: 69   Weight: 272 lb 8 oz (123.6 kg)   Height: 5\' 9"  (1.753 m)    Body mass index is 40.24 kg/m. Wt Readings from Last 3 Encounters:  07/27/23  272 lb 8 oz (123.6 kg)  07/12/23 271 lb 9.6 oz (123.2 kg)  03/23/23 269 lb 9.6 oz (122.3 kg)   Patient is in no distress; well developed,  nourished and groomed; neck is supple  MUSCULOSKELETAL: Gait, strength, tone, movements noted in Neurologic exam below  NEUROLOGIC: MENTAL STATUS:      No data to display         awake, alert, oriented to person, place and time recent and remote memory intact normal attention and concentration language fluent, comprehension intact, naming intact fund of knowledge appropriate     07/27/2023    9:26 AM  Montreal Cognitive Assessment   Visuospatial/ Executive (0/5) 5  Naming (0/3) 3  Attention: Read list of digits (0/2) 2  Attention: Read list of letters (0/1) 1  Attention: Serial 7 subtraction starting at 100 (0/3) 3  Language: Repeat phrase (0/2) 2  Language : Fluency (0/1) 1  Abstraction (0/2) 2  Delayed Recall (0/5) 3  Orientation (0/6) 6  Total 28  Adjusted Score (based on education) 28     CRANIAL NERVE:  2nd, 3rd, 4th, 6th- visual fields full to confrontation, extraocular muscles intact, no nystagmus 5th - facial sensation symmetric 7th - facial strength symmetric 8th - hearing intact 9th - palate elevates symmetrically, uvula midline 11th - shoulder shrug symmetric 12th - tongue protrusion midline  MOTOR:  normal bulk and tone, full strength in the BUE, BLE  SENSORY:  normal and symmetric to light touch  COORDINATION:  finger-nose-finger, fine finger movements normal  GAIT/STATION:  normal     DIAGNOSTIC DATA (LABS, IMAGING, TESTING) - I reviewed patient records, labs, notes, testing and imaging myself where available.  Lab Results  Component Value Date   WBC 8.1 12/30/2022   HGB 14.3 12/30/2022   HCT 42.6 12/30/2022   MCV 85.7 12/30/2022   PLT 211.0 12/30/2022      Component Value Date/Time   NA 140 03/03/2023 1204   NA 139 08/16/2018 1257   K 4.1 03/03/2023 1204   CL 103 03/03/2023 1204   CO2  29 03/03/2023 1204   GLUCOSE 87 03/03/2023 1204   BUN 10 03/03/2023 1204   BUN 13 08/16/2018 1257   CREATININE 0.74 03/03/2023 1204   CALCIUM 9.5 03/03/2023 1204   PROT 7.1 12/30/2022 0959   PROT 6.7 08/16/2018 1257   ALBUMIN 4.2 12/30/2022 0959   ALBUMIN 4.4 08/16/2018 1257   AST 13 12/30/2022 0959   ALT 16 12/30/2022 0959   ALKPHOS 78 12/30/2022 0959   BILITOT 0.6 12/30/2022 0959   BILITOT 0.4 08/16/2018 1257   GFRNONAA 81 08/16/2018 1257   GFRAA 94 08/16/2018 1257   Lab Results  Component Value Date   CHOL 181 12/30/2022   HDL 49.90 12/30/2022   LDLCALC 112 (H) 12/30/2022   TRIG 97.0 12/30/2022   CHOLHDL 4 12/30/2022   Lab Results  Component Value Date   HGBA1C 5.9 (H) 07/26/2023   Lab Results  Component Value Date   VITAMINB12 221 12/30/2022   Lab Results  Component Value Date   TSH 1.67 12/30/2022     ASSESSMENT AND PLAN  53 y.o. year old female with History of CPAP, hypertension, obesity, depression who is presenting with complaint of memory loss described as forgetful and having word finding difficulty.  She does have normal examination, no cognitive deficit found and independent at home.  I suspect patient has some underlying attention deficit that has never been treated.  I did advise her to contact the [facility] attention specialist for an evaluation.  Continue to follow with PCP and return as needed. She is comfortable with plans.    1. Cognitive complaints with normal  exam      Patient Instructions  Please continue current medications  Contact [facility] for evaluation of attention deficit  [email or web address] Continue to follow up with PCP  Return as needed   No orders of the defined types were placed in this encounter.   No orders of the defined types were placed in this encounter.   Return if symptoms worsen or fail to improve.    Windell Norfolk, MD 07/27/2023, 12:48  PM  [facility] [address], [address] [place], [place] [address] [phone]

## 2023-07-27 NOTE — Patient Instructions (Addendum)
Please continue current medications  Contact [facility] for evaluation of attention deficit  [email or web address] Continue to follow up with PCP  Return as needed

## 2023-07-28 ENCOUNTER — Other Ambulatory Visit: Payer: Self-pay | Admitting: Internal Medicine

## 2023-07-28 ENCOUNTER — Telehealth: Payer: Self-pay

## 2023-07-28 DIAGNOSIS — R0600 Dyspnea, unspecified: Secondary | ICD-10-CM

## 2023-07-28 DIAGNOSIS — I2721 Secondary pulmonary arterial hypertension: Secondary | ICD-10-CM

## 2023-07-28 DIAGNOSIS — R0789 Other chest pain: Secondary | ICD-10-CM

## 2023-07-28 LAB — NMR, LIPOPROFILE
Cholesterol, Total: 188 mg/dL (ref 100–199)
HDL Particle Number: 35.9 umol/L (ref >=30.5)
HDL-C: 54 mg/dL (ref >39)
LDL Particle Number: 1481 nmol/L — ABNORMAL HIGH (ref <1000)
LDL Size: 21.3 nm (ref >20.5)
LDL-C (NIH Calc): 118 mg/dL — ABNORMAL HIGH (ref 0–99)
LP-IR Score: 34 (ref <=45)
Small LDL Particle Number: 644 nmol/L — ABNORMAL HIGH (ref <=527)
Triglycerides: 88 mg/dL (ref 0–149)

## 2023-07-28 LAB — APOLIPOPROTEIN B: Apolipoprotein B: 91 mg/dL — ABNORMAL HIGH (ref <90)

## 2023-07-28 LAB — LIPOPROTEIN A (LPA): Lipoprotein (a): 15.5 nmol/L (ref <75.0)

## 2023-07-28 LAB — HEMOGLOBIN A1C
Est. average glucose Bld gHb Est-mCnc: 123 mg/dL
Hgb A1c MFr Bld: 5.9 % — ABNORMAL HIGH (ref 4.8–5.6)

## 2023-07-29 ENCOUNTER — Telehealth: Payer: Self-pay

## 2023-07-29 MED ORDER — METHYLPHENIDATE HCL ER (OSM) 54 MG PO TBCR
54.0000 mg | EXTENDED_RELEASE_TABLET | ORAL | 0 refills | Status: DC
Start: 2023-07-29 — End: 2024-02-07

## 2023-07-29 NOTE — Telephone Encounter (Signed)
Per Dr. Fabian Sharp  "Have her make fu appt " for Concerta  Attempted to reach pt.   Left a voicemail to call us back.

## 2023-08-02 ENCOUNTER — Ambulatory Visit: Payer: Managed Care, Other (non HMO) | Attending: Cardiology

## 2023-08-02 ENCOUNTER — Ambulatory Visit: Payer: Managed Care, Other (non HMO)

## 2023-08-02 DIAGNOSIS — R079 Chest pain, unspecified: Secondary | ICD-10-CM

## 2023-08-02 DIAGNOSIS — I2721 Secondary pulmonary arterial hypertension: Secondary | ICD-10-CM | POA: Diagnosis not present

## 2023-08-02 DIAGNOSIS — R0789 Other chest pain: Secondary | ICD-10-CM

## 2023-08-02 LAB — EXERCISE TOLERANCE TEST
Angina Index: 0
Duke Treadmill Score: 7
Estimated workload: 8
Exercise duration (min): 6 min
Exercise duration (sec): 36 s
MPHR: 167 {beats}/min
Peak HR: 144 {beats}/min
Percent HR: 86 %
RPE: 17
Rest HR: 76 {beats}/min
ST Depression (mm): 0 mm

## 2023-08-02 LAB — ECHOCARDIOGRAM COMPLETE
Area-P 1/2: 2.79 cm2
S' Lateral: 3.1 cm

## 2023-08-02 MED ORDER — PERFLUTREN LIPID MICROSPHERE
3.0000 mL | INTRAVENOUS | Status: AC | PRN
  Administered 2023-08-02: 3 mL via INTRAVENOUS

## 2023-10-02 ENCOUNTER — Encounter: Payer: Self-pay | Admitting: Internal Medicine

## 2023-10-12 ENCOUNTER — Other Ambulatory Visit: Payer: Self-pay | Admitting: Internal Medicine

## 2023-10-13 ENCOUNTER — Other Ambulatory Visit: Payer: Self-pay

## 2023-10-13 MED ORDER — SERTRALINE HCL 25 MG PO TABS: 25.0000 mg | ORAL_TABLET | Freq: Every day | ORAL | 0 refills | Status: DC

## 2023-10-13 NOTE — Telephone Encounter (Signed)
 Rx Sertraline  25mg  sent.

## 2023-10-17 NOTE — Progress Notes (Unsigned)
 No chief complaint on file.   HPI: Sharon Myers 53 y.o. come in for Chronic disease management   Saw dr Avanell Bob ards  jan 25 dx pah  and controlled ht  ROS: See pertinent positives and negatives per HPI.  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     Family History  Problem Relation Age of Onset   Cancer Mother        rare blood cancer   Stroke Mother    Heart failure Mother    Hypertension Father    Nephrolithiasis Father    Diabetes Father    COPD Father    Cancer Father        kidney   Hyperlipidemia Father    Sudden death Father    Sleep apnea Father    Alcoholism Father    Obesity Father    Thyroid  disease Maternal Aunt    Arthritis Maternal Grandmother        rheumatoid   Heart disease Maternal Grandfather    Cancer Maternal Grandfather        stomach   Cancer Paternal Grandfather        stomach ca    Social History   Socioeconomic History   Marital status: Married    Spouse name: Vaughn Georges   Number of children: 2   Years of education: Not on file   Highest education level: Bachelor's degree (e.g., BA, AB, BS)  Occupational History   Occupation: Work from home  Tobacco Use   Smoking status: Never    Passive exposure: Never   Smokeless tobacco: Never  Vaping Use   Vaping status: Never Used  Substance and Sexual Activity   Alcohol use: Yes    Alcohol/week: 1.0 standard drink of alcohol    Types: 1 Standard drinks or equivalent per week    Comment: occasionally   Drug use: No   Sexual activity:  Yes    Comment: tubal if CS  Other Topics Concern   Not on file  Social History Narrative   Usually receives 6 hours of sleep at night   4 people living in the home. No pets   Married 2 young children   Bachelors degree [employer] 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      Originally from [place]  increase her area for 20 years   G4P2   Social etoh no tobacco no ets FA    Social Drivers of Corporate investment banker Strain: Not on file  Food Insecurity: Not on file  Transportation Needs: Not on file  Physical Activity: Not on file  Stress: Not on file  Social Connections: Not on file    Outpatient Medications Prior to Visit  Medication Sig Dispense Refill   methylphenidate  (CONCERTA ) 54 MG PO CR tablet Take 1  tablet (54 mg total) by mouth every morning. Make appt for further refills 30 tablet 0   OVER THE COUNTER MEDICATION CALM Vitality supplement. Has magnesium citrate in it.     sertraline  (ZOLOFT ) 25 MG tablet Take 1 tablet (25 mg total) by mouth daily. 90 tablet 0   valsartan  (DIOVAN ) 80 MG tablet TAKE 1 TABLET (80 MG TOTAL) BY MOUTH DAILY. FOR HIGH BLOOD PRESSURE 90 tablet 1   vitamin B-12 (CYANOCOBALAMIN ) 100 MCG tablet Take 100 mcg by mouth daily.     No facility-administered medications prior to visit.     EXAM:  There were no vitals taken for this visit.  There is no height or weight on file to calculate BMI.  GENERAL: vitals reviewed and listed above, alert, oriented, appears well hydrated and in no acute distress HEENT: atraumatic, conjunctiva  clear, no obvious abnormalities on inspection of external nose and ears OP : no lesion edema or exudate  NECK: no obvious masses on inspection palpation  LUNGS: clear to auscultation bilaterally, no wheezes, rales or rhonchi, good air movement CV: HRRR, no clubbing cyanosis or  peripheral edema nl cap refill  MS: moves all extremities without noticeable focal  abnormality PSYCH: pleasant and cooperative, no obvious  depression or anxiety Lab Results  Component Value Date   WBC 8.1 12/30/2022   HGB 14.3 12/30/2022   HCT 42.6 12/30/2022   PLT 211.0 12/30/2022   GLUCOSE 87 03/03/2023   CHOL 181 12/30/2022   TRIG 97.0 12/30/2022   HDL 49.90 12/30/2022   LDLCALC 112 (H) 12/30/2022   ALT 16 12/30/2022   AST 13 12/30/2022   NA 140 03/03/2023   K 4.1 03/03/2023   CL 103 03/03/2023   CREATININE 0.74 03/03/2023   BUN 10 03/03/2023   CO2 29 03/03/2023   TSH 1.67 12/30/2022   HGBA1C 5.9 (H) 07/26/2023   BP Readings from Last 3 Encounters:  07/27/23 (!) 146/89  07/12/23 108/78  03/23/23 124/86    ASSESSMENT AND PLAN:  Discussed the following assessment and plan:  No diagnosis found.  -Patient advised to return or notify health care team  if  new concerns arise.  There are no Patient Instructions on file for this visit.   Elbert Polyakov K. Britney Newstrom M.D.

## 2023-10-18 ENCOUNTER — Telehealth: Payer: Self-pay

## 2023-10-18 ENCOUNTER — Other Ambulatory Visit (HOSPITAL_COMMUNITY): Payer: Self-pay

## 2023-10-18 ENCOUNTER — Encounter: Payer: Self-pay | Admitting: Internal Medicine

## 2023-10-18 ENCOUNTER — Ambulatory Visit: Admitting: Internal Medicine

## 2023-10-18 DIAGNOSIS — I10 Essential (primary) hypertension: Secondary | ICD-10-CM

## 2023-10-18 DIAGNOSIS — R7303 Prediabetes: Secondary | ICD-10-CM

## 2023-10-18 DIAGNOSIS — Z79899 Other long term (current) drug therapy: Secondary | ICD-10-CM

## 2023-10-18 DIAGNOSIS — G4733 Obstructive sleep apnea (adult) (pediatric): Secondary | ICD-10-CM

## 2023-10-18 DIAGNOSIS — E88819 Insulin resistance, unspecified: Secondary | ICD-10-CM

## 2023-10-18 LAB — LIPID PANEL
Cholesterol: 160 mg/dL (ref 0–200)
HDL: 54.7 mg/dL (ref >39.00)
LDL Cholesterol: 88 mg/dL (ref 0–99)
NonHDL: 105.5
Total CHOL/HDL Ratio: 3
Triglycerides: 86 mg/dL (ref 0.0–149.0)
VLDL: 17.2 mg/dL (ref 0.0–40.0)

## 2023-10-18 LAB — BASIC METABOLIC PANEL WITH GFR
BUN: 9 mg/dL (ref 6–23)
CO2: 27 meq/L (ref 19–32)
Calcium: 8.9 mg/dL (ref 8.4–10.5)
Chloride: 103 meq/L (ref 96–112)
Creatinine, Ser: 0.69 mg/dL (ref 0.40–1.20)
GFR: 99.2 mL/min (ref >60.00)
Glucose, Bld: 97 mg/dL (ref 70–99)
Potassium: 3.9 meq/L (ref 3.5–5.1)
Sodium: 138 meq/L (ref 135–145)

## 2023-10-18 LAB — POCT GLYCOSYLATED HEMOGLOBIN (HGB A1C): Hemoglobin A1C: 5.4 % (ref 4.0–5.6)

## 2023-10-18 LAB — HEPATIC FUNCTION PANEL
ALT: 17 U/L (ref 0–35)
AST: 16 U/L (ref 0–37)
Albumin: 4.1 g/dL (ref 3.5–5.2)
Alkaline Phosphatase: 68 U/L (ref 39–117)
Bilirubin, Direct: 0.1 mg/dL (ref 0.0–0.3)
Total Bilirubin: 0.4 mg/dL (ref 0.2–1.2)
Total Protein: 6.7 g/dL (ref 6.0–8.3)

## 2023-10-18 LAB — T4, FREE: Free T4: 0.91 ng/dL (ref 0.60–1.60)

## 2023-10-18 LAB — TSH: TSH: 2.55 u[IU]/mL (ref 0.35–5.50)

## 2023-10-18 MED ORDER — TIRZEPATIDE-WEIGHT MANAGEMENT 2.5 MG/0.5ML ~~LOC~~ SOLN: 2.5000 mg | SUBCUTANEOUS | 1 refills | Status: DC

## 2023-10-18 NOTE — Progress Notes (Signed)
 Results are in normal range include kidney liver thyroid   cholesterol. Blood count is pending

## 2023-10-18 NOTE — Patient Instructions (Addendum)
 Update  lab today Take blood pressure readings twice a day for 5-7 days and then periodically .To ensure below #) at goal . Average 130/80 and below   we can increase the valsartan   from 80 to 160 mg .  Plan fu appt virtual in 1  month after  beginning med and then go from there.    Zepbound  is indicated   for OSA   and obesity   wegovy  will most likely work but no yet fda indicated .  Check [employer] site . For program cost est .

## 2023-10-18 NOTE — Telephone Encounter (Signed)
 Pharmacy Patient Advocate Encounter   Received notification from CoverMyMeds that prior authorization for Zepbound 2.5MG /0.5ML pen-injectors is required/requested.   Insurance verification completed.   The patient is insured through [employer] .   Per test claim: Drug is not covered by her plan

## 2023-10-19 LAB — CBC WITH DIFFERENTIAL/PLATELET
Basophils Absolute: 0.1 10*3/uL (ref 0.0–0.1)
Basophils Relative: 1.3 % (ref 0.0–3.0)
Eosinophils Absolute: 0.1 10*3/uL (ref 0.0–0.7)
Eosinophils Relative: 1.3 % (ref 0.0–5.0)
HCT: 42.9 % (ref 36.0–46.0)
Hemoglobin: 14.1 g/dL (ref 12.0–15.0)
Lymphocytes Relative: 32 % (ref 12.0–46.0)
Lymphs Abs: 2.8 10*3/uL (ref 0.7–4.0)
MCHC: 32.8 g/dL (ref 30.0–36.0)
MCV: 89.5 fl (ref 78.0–100.0)
Monocytes Absolute: 0.8 10*3/uL (ref 0.1–1.0)
Monocytes Relative: 8.9 % (ref 3.0–12.0)
Neutro Abs: 4.9 10*3/uL (ref 1.4–7.7)
Neutrophils Relative %: 56.5 % (ref 43.0–77.0)
Platelets: 234 10*3/uL (ref 150.0–400.0)
RBC: 4.8 Mil/uL (ref 3.87–5.11)
RDW: 13.8 % (ref 11.5–15.5)
WBC: 8.7 10*3/uL (ref 4.0–10.5)

## 2023-11-08 NOTE — Telephone Encounter (Signed)
 Spoke to pt and inform pt of message. Pt verbalized understanding.

## 2023-12-06 ENCOUNTER — Encounter: Payer: Self-pay | Admitting: Dermatology

## 2023-12-08 ENCOUNTER — Telehealth: Admitting: Physician Assistant

## 2023-12-08 DIAGNOSIS — H8113 Benign paroxysmal vertigo, bilateral: Secondary | ICD-10-CM

## 2023-12-08 MED ORDER — MECLIZINE HCL 25 MG PO TABS
25.0000 mg | ORAL_TABLET | Freq: Three times a day (TID) | ORAL | 0 refills | Status: AC | PRN
Start: 2023-12-08 — End: ?

## 2023-12-08 NOTE — Patient Instructions (Signed)
 Charmaine Bihari, thank you for joining Delon CHRISTELLA Dickinson, PA-C for today's virtual visit.  While this provider is not your primary care provider (PCP), if your PCP is located in our provider database this encounter information will be shared with them immediately following your visit.   A [facility] MyChart account gives you access to today's visit and all your visits, tests, and labs performed at [facility]  click here if you don't have a [facility] MyChart account or go to [email or web address]  Consent: (Patient) Sharon Myers provided verbal consent for this virtual visit at the beginning of the encounter.  Current Medications:  Current Outpatient Medications:    meclizine (ANTIVERT) 25 MG tablet, Take 1 tablet (25 mg total) by mouth 3 (three) times daily as needed for dizziness., Disp: 30 tablet, Rfl: 0   Cetirizine HCl (ZYRTEC PO), Take by mouth as needed., Disp: , Rfl:    estradiol (ESTRACE) 0.5 MG tablet, Take 0.5 mg by mouth daily., Disp: , Rfl:    methylphenidate  (CONCERTA ) 54 MG PO CR tablet, Take 1 tablet (54 mg total) by mouth every morning. Make appt for further refills (Patient not taking: Reported on 10/18/2023), Disp: 30 tablet, Rfl: 0   OVER THE COUNTER MEDICATION, CALM Vitality supplement. Has magnesium citrate in it. (Patient not taking: Reported on 10/18/2023), Disp: , Rfl:    OVER THE COUNTER MEDICATION, Reports she is taking [employer] Nutrafol for women hair growth., Disp: , Rfl:    sertraline  (ZOLOFT ) 25 MG tablet, Take 1 tablet (25 mg total) by mouth daily., Disp: 90 tablet, Rfl: 0   tirzepatide  (ZEPBOUND ) 2.5 MG/0.5ML injection vial, Inject 2.5 mg into the skin once a week. For OSA and obesity, Disp: 0.5 mL, Rfl: 1   valsartan  (DIOVAN ) 80 MG tablet, TAKE 1 TABLET (80 MG TOTAL) BY MOUTH DAILY. FOR HIGH BLOOD PRESSURE, Disp: 90 tablet, Rfl: 1   vitamin B-12 (CYANOCOBALAMIN ) 100 MCG tablet, Take 100 mcg by mouth daily., Disp: , Rfl:    Medications ordered  in this encounter:  Meds ordered this encounter  Medications   meclizine (ANTIVERT) 25 MG tablet    Sig: Take 1 tablet (25 mg total) by mouth 3 (three) times daily as needed for dizziness.    Dispense:  30 tablet    Refill:  0    Supervising Provider:   LAMPTEY, PHILIP O [[id number]]     *If you need refills on other medications prior to your next appointment, please contact your pharmacy*  Follow-Up: Call back or seek an in-person evaluation if the symptoms worsen or if the condition fails to improve as anticipated.  [facility] [phone]  Other Instructions  Benign Positional Vertigo Vertigo is the feeling that you or your surroundings are moving when they are not. Benign positional vertigo is the most common form of vertigo. This is usually a harmless condition (benign). This condition is positional. This means that symptoms are triggered by certain movements and positions. This condition can be dangerous if it occurs while you are doing something that could cause harm to yourself or others. This includes activities such as driving or operating machinery. What are the causes? The inner ear has fluid-filled canals that help your brain sense movement and balance. When the fluid moves, the brain receives messages about your body's position. With benign positional vertigo, calcium crystals in the inner ear break free and disturb the inner ear area. This causes your brain to receive confusing messages about your body's position.  What increases the risk? You are more likely to develop this condition if: You are a woman. You are 82 years of age or older. You have recently had a head injury. You have an inner ear disease. What are the signs or symptoms? Symptoms of this condition usually happen when you move your head or your eyes in different directions. Symptoms may start suddenly and usually last for less than a minute. They include: Loss of balance and falling. Feeling  like you are spinning or moving. Feeling like your surroundings are spinning or moving. Nausea and vomiting. Blurred vision. Dizziness. Involuntary eye movement (nystagmus). Symptoms can be mild and cause only minor problems, or they can be severe and interfere with daily life. Episodes of benign positional vertigo may return (recur) over time. Symptoms may also improve over time. How is this diagnosed? This condition may be diagnosed based on: Your medical history. A physical exam of the head, neck, and ears. Positional tests to check for or stimulate vertigo. You may be asked to turn your head and change positions, such as going from sitting to lying down. A health care provider will watch for symptoms of vertigo. You may be referred to a health care provider who specializes in ear, nose, and throat problems (ENT or otolaryngologist) or a provider who specializes in disorders of the nervous system (neurologist). How is this treated?  This condition may be treated in a session in which your health care provider moves your head in specific positions to help the displaced crystals in your inner ear move. Treatment for this condition may take several sessions. Surgery may be needed in severe cases, but this is rare. In some cases, benign positional vertigo may resolve on its own in 2-4 weeks. Follow these instructions at home: Safety Move slowly. Avoid sudden body or head movements or certain positions, as told by your health care provider. Avoid driving or operating machinery until your health care provider says it is safe. Avoid doing any tasks that would be dangerous to you or others if vertigo occurs. If you have trouble walking or keeping your balance, try using a cane for stability. If you feel dizzy or unstable, sit down right away. Return to your normal activities as told by your health care provider. Ask your health care provider what activities are safe for you. General  instructions Take over-the-counter and prescription medicines only as told by your health care provider. Drink enough fluid to keep your urine pale yellow. Keep all follow-up visits. This is important. Contact a health care provider if: You have a fever. Your condition gets worse or you develop new symptoms. Your family or friends notice any behavioral changes. You have nausea or vomiting that gets worse. You have numbness or a prickling and tingling sensation. Get help right away if you: Have difficulty speaking or moving. Are always dizzy or faint. Develop severe headaches. Have weakness in your legs or arms. Have changes in your hearing or vision. Develop a stiff neck. Develop sensitivity to light. These symptoms may represent a serious problem that is an emergency. Do not wait to see if the symptoms will go away. Get medical help right away. Call your local emergency services (911 in the [place] Do not drive yourself to the hospital. Summary Vertigo is the feeling that you or your surroundings are moving when they are not. Benign positional vertigo is the most common form of vertigo. This condition is caused by calcium crystals in the inner ear that  become displaced. This causes a disturbance in an area of the inner ear that helps your brain sense movement and balance. Symptoms include loss of balance and falling, feeling that you or your surroundings are moving, nausea and vomiting, and blurred vision. This condition can be diagnosed based on symptoms, a physical exam, and positional tests. Follow safety instructions as told by your health care provider and keep all follow-up visits. This is important. This information is not intended to replace advice given to you by your health care provider. Make sure you discuss any questions you have with your health care provider. Document Revised: 12/20/2022 Document Reviewed: 12/20/2022 Elsevier Patient Education  2024 Elsevier  Inc.    Brandt-Daroff Exercise for Vertigo  The Brandt-Daroff exercise is one of several exercises that can speed up the compensation process and end the symptoms of vertigo. It often is prescribed for people who have benign paroxysmal positional vertigo (BPPV) and sometimes for labyrinthitis. These exercises won't cure these conditions. But over time they can reduce symptoms of vertigo.  People who use this exercise usually are told to do several repetitions of the exercise at least twice a day.  How It Is Done To do the Brandt-Daroff exercise:  Start in an upright, seated position. Move into the lying position on one side with your nose pointed up at about a 45-degree angle. Remain in this position for about 30 seconds (or until the vertigo subsides, whichever is longer), then move back to the seated position. Repeat steps 2 and 3 on the other side. What To Expect Symptoms sometimes suddenly go away during an exercise period. More often, improvement occurs gradually over a period of weeks or months.  Why It Is Done The Brandt-Daroff exercise and other similar exercises are used to treat BPPV. These exercises are sometimes used to treat labyrinthitis or vestibular neuritis.  How Well It Works These exercises can help your body get used to the confusing signals that are causing your vertigo. This may help you get over your vertigo sooner.  The Brandt-Daroff exercise does not help relieve the symptoms of benign paroxysmal positional vertigo (BPPV) as well as the Semont maneuver or the Epley maneuver.footnote1  Risks There are no risks in doing these exercises. To avoid hitting your head or developing minor neck injuries, be careful not to lie down too quickly.  References Citations Fife TD, et al. (2008). Practice parameter: Therapies for benign paroxysmal positional vertigo (an evidence-based review). Report of the Quality Standards Subcommittee of the American Academy of Neurology.  Neurology, 70(22): [phone]. Current as of: Oct 15, 2020   How to Perform the Epley Maneuver The Epley maneuver is an exercise that relieves symptoms of vertigo. Vertigo is the feeling that you or your surroundings are moving when they are not. When you feel vertigo, you may feel like the room is spinning and may have trouble walking. The Epley maneuver is used for a type of vertigo caused by a calcium deposit in a part of the inner ear. The maneuver involves changing head positions to help the deposit move out of the area. You can do this maneuver at home whenever you have symptoms of vertigo. You can repeat it in 24 hours if your vertigo has not gone away. Even though the Epley maneuver may relieve your vertigo for a few weeks, it is possible that your symptoms will return. This maneuver relieves vertigo, but it does not relieve dizziness. What are the risks? If it is done correctly, the  Epley maneuver is considered safe. Sometimes it can lead to dizziness or nausea that goes away after a short time. If you develop other symptoms--such as changes in vision, weakness, or numbness--stop doing the maneuver and call your health care provider. Supplies needed: A bed or table. A pillow. How to do the Epley maneuver     Sit on the edge of a bed or table with your back straight and your legs extended or hanging over the edge of the bed or table. Turn your head halfway toward the affected ear or side as told by your health care provider. Lie backward quickly with your head turned until you are lying flat on your back. Your head should dangle (head-hanging position). You may want to position a pillow under your shoulders. Hold this position for at least 30 seconds. If you feel dizzy or have symptoms of vertigo, continue to hold the position until the symptoms stop. Turn your head to the opposite direction until your unaffected ear is facing down. Your head should continue to dangle. Hold this position  for at least 30 seconds. If you feel dizzy or have symptoms of vertigo, continue to hold the position until the symptoms stop. Turn your whole body to the same side as your head so that you are positioned on your side. Your head will now be nearly facedown and no longer needs to dangle. Hold for at least 30 seconds. If you feel dizzy or have symptoms of vertigo, continue to hold the position until the symptoms stop. Sit back up. You can repeat the maneuver in 24 hours if your vertigo does not go away. Follow these instructions at home: For 24 hours after doing the Epley maneuver: Keep your head in an upright position. When lying down to sleep or rest, keep your head raised (elevated) with two or more pillows. Avoid excessive neck movements. Activity Do not drive or use machinery if you feel dizzy. After doing the Epley maneuver, return to your normal activities as told by your health care provider. Ask your health care provider what activities are safe for you. General instructions Drink enough fluid to keep your urine pale yellow. Do not drink alcohol. Take over-the-counter and prescription medicines only as told by your health care provider. Keep all follow-up visits. This is important. Preventing vertigo symptoms Ask your health care provider if there is anything you should do at home to prevent vertigo. He or she may recommend that you: Keep your head elevated with two or more pillows while you sleep. Do not sleep on the side of your affected ear. Get up slowly from bed. Avoid sudden movements during the day. Avoid extreme head positions or movement, such as looking up or bending over. Contact a health care provider if: Your vertigo gets worse. You have other symptoms, including: Nausea. Vomiting. Headache. Get help right away if you: Have vision changes. Have a headache or neck pain that is severe or getting worse. Cannot stop vomiting. Have new numbness or weakness in any part  of your body. These symptoms may represent a serious problem that is an emergency. Do not wait to see if the symptoms will go away. Get medical help right away. Call your local emergency services (911 in the [place] Do not drive yourself to the hospital. Summary Vertigo is the feeling that you or your surroundings are moving when they are not. The Epley maneuver is an exercise that relieves symptoms of vertigo. If the Epley maneuver is done correctly,  it is considered safe. This information is not intended to replace advice given to you by your health care provider. Make sure you discuss any questions you have with your health care provider. Document Revised: 02/25/2023 Document Reviewed: 02/25/2023 Elsevier Patient Education  2024 Elsevier Inc.   If you have been instructed to have an in-person evaluation today at a local Urgent Care facility, please use the link below. It will take you to a list of all of our available [facility], including address, phone number and hours of operation. Please do not delay care.  [facility]  If you or a family member do not have a primary care provider, use the link below to schedule a visit and establish care. When you choose a [facility] primary care physician or advanced practice provider, you gain a long-term partner in health. Find a Primary Care Provider  Learn more about [facility] in-office and virtual care options:  - Get Care Now

## 2023-12-08 NOTE — Progress Notes (Signed)
 Virtual Visit Consent   Sharon Myers, you are scheduled for a virtual visit with a [facility] provider today. Just as with appointments in the office, your consent must be obtained to participate. Your consent will be active for this visit and any virtual visit you may have with one of our providers in the next 365 days. If you have a MyChart account, a copy of this consent can be sent to you electronically.  As this is a virtual visit, video technology does not allow for your provider to perform a traditional examination. This may limit your provider's ability to fully assess your condition. If your provider identifies any concerns that need to be evaluated in person or the need to arrange testing (such as labs, EKG, etc.), we will make arrangements to do so. Although advances in technology are sophisticated, we cannot ensure that it will always work on either your end or our end. If the connection with a video visit is poor, the visit may have to be switched to a telephone visit. With either a video or telephone visit, we are not always able to ensure that we have a secure connection.  By engaging in this virtual visit, you consent to the provision of healthcare and authorize for your insurance to be billed (if applicable) for the services provided during this visit. Depending on your insurance coverage, you may receive a charge related to this service.  I need to obtain your verbal consent now. Are you willing to proceed with your visit today? Sharon Myers has provided verbal consent on 12/08/2023 for a virtual visit (video or telephone). Sharon CHRISTELLA Dickinson, PA-C  Date: 12/08/2023 6:36 PM   Virtual Visit via Video Note   I, Sharon Myers, connected with  Sharon Myers  ([id number], 11/25/1970) on 12/08/23 at  6:00 PM EDT by a video-enabled telemedicine application and verified that I am speaking with the correct person using two identifiers.  Location: Patient: Virtual Visit  Location Patient: Home Provider: Virtual Visit Location Provider: Home Office   I discussed the limitations of evaluation and management by telemedicine and the availability of in person appointments. The patient expressed understanding and agreed to proceed.    History of Present Illness: Sharon Myers is a 53 y.o. who identifies as a female who was assigned female at birth, and is being seen today for vertigo.  HPI: Dizziness This is a recurrent problem. The current episode started in the past 7 days (Started while on vacation recently, involved 10 hours in a car). The problem occurs intermittently (with head movements). The problem has been unchanged. Associated symptoms include fatigue, a rash (does have a rash on left lower leg and on left breast she has seen Dermatology for rash on leg and completed Keflex with no improvement) and vertigo. Pertinent negatives include no congestion, coughing, fever, nausea, sore throat, visual change or vomiting. Exacerbated by: changes in head position. Treatments tried: Bonine. The treatment provided mild relief.     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   Depression 11/27/2018   Vitamin D  deficiency 07/18/2018   Insulin  resistance 07/18/2018   Class 1 obesity with serious comorbidity and body mass index (BMI) of 33.0 to 33.9 in adult 07/18/2018   Achilles tendinosis 01/03/2018   S/P Achilles tendon repair 01/03/2018   Foot pain, right 12/07/2013   Disturbed concentration 08/29/2013   Snoring 08/29/2013   Family history of sleep apnea 08/29/2013  Family history of blood clots 05/22/2013   Cutaneous skin tags 12/26/2012   Numbness and tingling of leg 11/22/2012   Goiter 11/22/2012   Other malaise and fatigue 11/22/2012   Pain in both feet 11/22/2012   Adjustment disorder with depressed mood 11/22/2012    Allergies: No Known Allergies Medications:  Current Outpatient  Medications:    meclizine (ANTIVERT) 25 MG tablet, Take 1 tablet (25 mg total) by mouth 3 (three) times daily as needed for dizziness., Disp: 30 tablet, Rfl: 0   Cetirizine HCl (ZYRTEC PO), Take by mouth as needed., Disp: , Rfl:    estradiol (ESTRACE) 0.5 MG tablet, Take 0.5 mg by mouth daily., Disp: , Rfl:    methylphenidate  (CONCERTA ) 54 MG PO CR tablet, Take 1 tablet (54 mg total) by mouth every morning. Make appt for further refills (Patient not taking: Reported on 10/18/2023), Disp: 30 tablet, Rfl: 0   OVER THE COUNTER MEDICATION, CALM Vitality supplement. Has magnesium citrate in it. (Patient not taking: Reported on 10/18/2023), Disp: , Rfl:    OVER THE COUNTER MEDICATION, Reports she is taking [employer] Nutrafol for women hair growth., Disp: , Rfl:    sertraline  (ZOLOFT ) 25 MG tablet, Take 1 tablet (25 mg total) by mouth daily., Disp: 90 tablet, Rfl: 0   tirzepatide  (ZEPBOUND ) 2.5 MG/0.5ML injection vial, Inject 2.5 mg into the skin once a week. For OSA and obesity, Disp: 0.5 mL, Rfl: 1   valsartan  (DIOVAN ) 80 MG tablet, TAKE 1 TABLET (80 MG TOTAL) BY MOUTH DAILY. FOR HIGH BLOOD PRESSURE, Disp: 90 tablet, Rfl: 1   vitamin B-12 (CYANOCOBALAMIN ) 100 MCG tablet, Take 100 mcg by mouth daily., Disp: , Rfl:   Observations/Objective: Patient is well-developed, well-nourished in no acute distress.  Resting comfortably at home.  Head is normocephalic, atraumatic.  No labored breathing.  Speech is clear and coherent with logical content.  Patient is alert and oriented at baseline.    Assessment and Plan: 1. Benign paroxysmal positional vertigo due to bilateral vestibular disorder (Primary) - meclizine (ANTIVERT) 25 MG tablet; Take 1 tablet (25 mg total) by mouth 3 (three) times daily as needed for dizziness.  Dispense: 30 tablet; Refill: 0  - Discussed possibly from recent travel vs allergies vs inflammatory (ETD issues) vs fluid imbalance - Will provide Meclizine - Discussed could add sudafed or  Flonase for inflammation or congestion/fluid imbalance - Discussed Epley maneuver and Brandt-Daroff exercises (provided in AVS) - Discussed perimenopausal changes-she will follow up with OB/GYN - Seek in person evaluation if symptoms worsen  Follow Up Instructions: I discussed the assessment and treatment plan with the patient. The patient was provided an opportunity to ask questions and all were answered. The patient agreed with the plan and demonstrated an understanding of the instructions.  A copy of instructions were sent to the patient via MyChart unless otherwise noted below.    The patient was advised to call back or seek an in-person evaluation if the symptoms worsen or if the condition fails to improve as anticipated.    Sharon CHRISTELLA Dickinson, PA-C

## 2023-12-23 ENCOUNTER — Other Ambulatory Visit: Payer: Self-pay | Admitting: Family

## 2023-12-30 NOTE — Telephone Encounter (Signed)
 Closing this encounter

## 2024-01-01 ENCOUNTER — Encounter: Payer: Self-pay | Admitting: Internal Medicine

## 2024-01-02 MED ORDER — VALSARTAN 80 MG PO TABS: 80.0000 mg | ORAL_TABLET | Freq: Every day | ORAL | 1 refills | Status: DC

## 2024-01-19 ENCOUNTER — Other Ambulatory Visit: Payer: Self-pay | Admitting: Internal Medicine

## 2024-01-19 ENCOUNTER — Telehealth: Payer: Self-pay

## 2024-01-19 NOTE — Telephone Encounter (Signed)
 Attempted to reach pt regarding to follow up on hypertension. Left a voicemail to call us  back.

## 2024-02-07 ENCOUNTER — Encounter: Payer: Self-pay | Admitting: Internal Medicine

## 2024-02-07 MED ORDER — METHYLPHENIDATE HCL ER (OSM) 54 MG PO TBCR: 54.0000 mg | EXTENDED_RELEASE_TABLET | ORAL | 0 refills | Status: DC

## 2024-02-07 NOTE — Telephone Encounter (Signed)
 Refilled the concerta  and keep the upcoming appt

## 2024-02-07 NOTE — Telephone Encounter (Signed)
 Attempted to reach pt. Left a detail message that Rx sent and to keep upcoming visit.

## 2024-02-21 ENCOUNTER — Telehealth: Admitting: Internal Medicine

## 2024-02-21 ENCOUNTER — Encounter: Payer: Self-pay | Admitting: Internal Medicine

## 2024-02-21 VITALS — Ht 69.0 in | Wt 275.4 lb

## 2024-02-21 DIAGNOSIS — F900 Attention-deficit hyperactivity disorder, predominantly inattentive type: Secondary | ICD-10-CM

## 2024-02-21 DIAGNOSIS — Z79899 Other long term (current) drug therapy: Secondary | ICD-10-CM

## 2024-02-21 MED ORDER — METHYLPHENIDATE HCL ER (OSM) 54 MG PO TBCR: 54.0000 mg | EXTENDED_RELEASE_TABLET | ORAL | 0 refills | Status: DC

## 2024-02-21 NOTE — Progress Notes (Signed)
 Virtual Visit via Video Note  I connected with Sharon Myers on 02/21/24 at  3:00 PM EDT by a video enabled telemedicine application and verified that I am speaking with the correct person using two identifiers. Location patient: work [employer] education [employer] office Persons participating in the virtual visit: patient, provider   Patient aware  of the limitations of evaluation and management by telemedicine and  availability of in person appointments. and agreed to proceed.   HPI: Sharon Myers presents for video visit  med check   Add /ADHD: Restarted Concerta  54,    to help  at work tasks but  time of year and has helped a good deal  Denies se of ramp up and off .  Sleep 7 hours   Zepbound  declined by  insurance .  BP cuff broke so doesn't have bp readings today   No new  medical concerns  ROS: See pertinent positives and negatives per HPI.  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 Vickye SANES SURGERY     hirsch   SKIN GRAFT      Family History  Problem Relation Age of Onset   Cancer Mother        rare blood cancer   Stroke Mother    Heart failure Mother    Hypertension Father    Nephrolithiasis Father    Diabetes Father    COPD Father    Cancer Father        kidney   Hyperlipidemia Father    Sudden death Father    Sleep apnea Father    Alcoholism Father    Obesity Father    Thyroid  disease  Maternal Aunt    Arthritis Maternal Grandmother        rheumatoid   Heart disease Maternal Grandfather    Cancer Maternal Grandfather        stomach   Cancer Paternal Grandfather        stomach ca    Social History   Tobacco Use   Smoking status: Never    Passive exposure: Never   Smokeless tobacco: Never  Vaping Use   Vaping status: Never Used  Substance Use Topics   Alcohol use: Yes    Alcohol/week: 1.0 standard drink of alcohol    Types: 1 Standard drinks or equivalent per week    Comment: occasionally   Drug use: No      Current Outpatient Medications:    Cetirizine HCl (ZYRTEC PO), Take by mouth as needed., Disp: , Rfl:    estradiol (ESTRACE) 0.5 MG tablet, Take 0.5 mg by mouth daily., Disp: , Rfl:    meclizine  (ANTIVERT ) 25 MG tablet, Take 1  tablet (25 mg total) by mouth 3 (three) times daily as needed for dizziness., Disp: 30 tablet, Rfl: 0   methylphenidate  (CONCERTA ) 54 MG PO CR tablet, Take 1 tablet (54 mg total) by mouth every morning., Disp: 30 tablet, Rfl: 0   sertraline  (ZOLOFT ) 25 MG tablet, TAKE 1 TABLET (25 MG TOTAL) BY MOUTH DAILY., Disp: 90 tablet, Rfl: 0   valsartan  (DIOVAN ) 80 MG tablet, Take 1 tablet (80 mg total) by mouth daily. For high blood pressure, Disp: 90 tablet, Rfl: 1  EXAM: BP Readings from Last 3 Encounters:  10/18/23 126/78  07/27/23 (!) 146/89  07/12/23 108/78    VITALS per patient if applicable:  GENERAL: alert, oriented, appears well and in no acute distress  HEENT: atraumatic, conjunttiva clear, no obvious abnormalities on inspection of external nose and ears  NECK: normal movements of the head and neck  LUNGS: on inspection no signs of respiratory distress, breathing rate appears normal, no obvious gross SOB, gasping or wheezing  CV: no obvious cyanosis  MS: moves all visible extremities without noticeable abnormality  PSYCH/NEURO: pleasant and cooperative, no obvious depression or anxiety, speech and thought processing  grossly intact Lab Results  Component Value Date   WBC 8.7 10/18/2023   HGB 14.1 10/18/2023   HCT 42.9 10/18/2023   PLT 234.0 10/18/2023   GLUCOSE 97 10/18/2023   CHOL 160 10/18/2023   TRIG 86.0 10/18/2023   HDL 54.70 10/18/2023   LDLCALC 88 10/18/2023   ALT 17 10/18/2023   AST 16 10/18/2023   NA 138 10/18/2023   K 3.9 10/18/2023   CL 103 10/18/2023   CREATININE 0.69 10/18/2023   BUN 9 10/18/2023   CO2 27 10/18/2023   TSH 2.55 10/18/2023   HGBA1C 5.4 10/18/2023    ASSESSMENT AND PLAN:  Discussed the following assessment and plan:    ICD-10-CM   1. ADHD, predominantly inattentive type  F90.0     2. Medication management  Z79.899      Benefit more than risk of medication concerta    to continue.   Refill today  ok to  be off on weekends if appropriate  No sig se of concerns otherwise  To get new bp monitor  and monitor  Plan in person visit  in  3-4 mos  November ok or when  best convenient  Counseled.   Expectant management and discussion of plan and treatment with opportunity to ask questions and all were answered. The patient agreed with the plan and demonstrated an understanding of the instructions.   Advised to call back or seek an in-person evaluation if worsening  or having  further concerns  in interim. Return for 2-4 months as convenient .    Apolinar Eastern, MD

## 2024-04-10 ENCOUNTER — Ambulatory Visit: Admitting: Family Medicine

## 2024-04-10 ENCOUNTER — Encounter: Payer: Self-pay | Admitting: Family Medicine

## 2024-04-10 VITALS — BP 140/86 | HR 76 | Temp 98.3°F | Wt 276.1 lb

## 2024-04-10 DIAGNOSIS — H66001 Acute suppurative otitis media without spontaneous rupture of ear drum, right ear: Secondary | ICD-10-CM

## 2024-04-10 MED ORDER — OFLOXACIN 0.3 % OT SOLN: 5.0000 [drp] | Freq: Two times a day (BID) | OTIC | 0 refills | Status: AC

## 2024-04-10 MED ORDER — AMOXICILLIN-POT CLAVULANATE 875-125 MG PO TABS: 1.0000 | ORAL_TABLET | Freq: Two times a day (BID) | ORAL | 0 refills | Status: AC

## 2024-04-10 NOTE — Progress Notes (Signed)
 Established Patient Office Visit  Subjective   Patient ID: Sharon Myers, female    DOB: [birth year]  Age: 53 y.o. MRN: [id number]  No chief complaint on file.   HPI   Sharon Myers is seen with approximately 3-week history of some right ear discharge.  She feels like to some extent both ears feel clogged up but mostly right ear.  Minimal pain.  She has had some crusted drainage intermittently.  She has also had some progressive sinusitis symptoms with pain and pressure over her maxillary and ethmoid sinus region.  Occasional headaches.  No documented fever.  No recent swimming.  No cough.  No chronic sinus or ear difficulties.  No known allergies.  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 Vickye SANES SURGERY     hirsch   SKIN GRAFT      reports that she has never smoked. She has never been exposed to tobacco smoke. She has never used smokeless tobacco. She reports current alcohol use of about 1.0 standard drink of alcohol per week. She reports that she does not use drugs. family history includes Alcoholism in her father; Arthritis in her maternal grandmother; COPD in her father; Cancer in her father, maternal grandfather, mother, and paternal grandfather; Diabetes in her father; Heart disease in her maternal grandfather; Heart failure in her mother; Hyperlipidemia in her father; Hypertension in her father; Nephrolithiasis in  her father; Obesity in her father; Sleep apnea in her father; Stroke in her mother; Sudden death in her father; Thyroid  disease in her maternal aunt. No Known Allergies  Review of Systems  Constitutional:  Negative for chills and fever.  HENT:  Positive for congestion, ear discharge, ear pain and sinus pain. Negative for tinnitus.   Respiratory:  Negative for cough.       Objective:     BP (!) 140/86   Pulse 76   Temp 98.3 F (36.8 C) (Oral)   Wt 276 lb 1.6 oz (125.2 kg)   SpO2 98%   BMI 40.77 kg/m  BP Readings from Last 3 Encounters:  04/10/24 (!) 140/86  10/18/23 126/78  07/27/23 (!) 146/89   Wt Readings from Last 3 Encounters:  04/10/24 276 lb 1.6 oz (125.2 kg)  02/21/24 275 lb 6.4 oz (124.9 kg)  10/18/23 275 lb 6.4 oz (124.9 kg)      Physical Exam Vitals reviewed.  Constitutional:      General: She is not in acute distress.    Appearance: She is not ill-appearing.  HENT:     Ears:     Comments: Left TM appears normal.  Right TM is obscured by yellowish mucopurulent drainage. Cardiovascular:  Rate and Rhythm: Normal rate and regular rhythm.  Pulmonary:     Effort: Pulmonary effort is normal.     Breath sounds: Normal breath sounds.  Neurological:     Mental Status: She is alert.      No results found for any visits on 04/10/24.    The 10-year ASCVD risk score (Arnett DK, et al., 2019) is: 2%    Assessment & Plan:   Patient seen with 3-week history of sinusitis symptoms and right ear effusion with mucopurulent type drainage in the canal.  This obscures good visualization of TM  -Keep ear canal dry as possible Start Floxin otic 5 drops right ear twice daily - Start Augmentin 875 mg twice daily for 10 days - Follow-up with primary if symptoms not resolving over the next couple of weeks  Wolm Scarlet, MD

## 2024-04-17 ENCOUNTER — Ambulatory Visit: Admitting: Internal Medicine

## 2024-05-08 ENCOUNTER — Encounter: Payer: Self-pay | Admitting: Internal Medicine

## 2024-05-16 ENCOUNTER — Other Ambulatory Visit: Payer: Self-pay | Admitting: Internal Medicine

## 2024-06-12 ENCOUNTER — Ambulatory Visit: Admitting: Internal Medicine

## 2024-06-12 ENCOUNTER — Encounter: Payer: Self-pay | Admitting: Internal Medicine

## 2024-06-12 ENCOUNTER — Telehealth: Payer: Self-pay | Admitting: *Deleted

## 2024-06-12 VITALS — BP 139/86 | HR 81 | Temp 98.3°F | Ht 69.0 in | Wt 282.0 lb

## 2024-06-12 DIAGNOSIS — Z1211 Encounter for screening for malignant neoplasm of colon: Secondary | ICD-10-CM | POA: Diagnosis not present

## 2024-06-12 DIAGNOSIS — Z79899 Other long term (current) drug therapy: Secondary | ICD-10-CM

## 2024-06-12 DIAGNOSIS — F900 Attention-deficit hyperactivity disorder, predominantly inattentive type: Secondary | ICD-10-CM | POA: Diagnosis not present

## 2024-06-12 DIAGNOSIS — G4733 Obstructive sleep apnea (adult) (pediatric): Secondary | ICD-10-CM | POA: Diagnosis not present

## 2024-06-12 MED ORDER — METHYLPHENIDATE HCL ER (OSM) 54 MG PO TBCR: 54.0000 mg | EXTENDED_RELEASE_TABLET | ORAL | 0 refills | Status: AC

## 2024-06-12 NOTE — Progress Notes (Signed)
 "  Chief Complaint  Patient presents with   Medical Management of Chronic Issues    Pt is here is for med check to discuss Concerta .Also discuss weight loss medication. Discuss supplement for hair growth.     HPI: Sharon Myers 53 y.o. come in for Chronic disease management   ADDADHD: med evaluation:  never took daily   med concerta  but as needed for werk.  Role changing  at work  would like to have at  home to take  good effects  Less  procrastination  when on meds .  And thinks will be  helpful  no  sog side effects defined  Sleep  6-7  hours  but interrupted  and was dx with   Osa   and got dental mold and was awful.  So not working.   Takes   45 min power nap . A few times per week  Colonoscopy screen  father  had polyp . Has donated   blood in past years and neg.  And no hx of hep c but pops on  epic  Had resp illness could it be from allergy x mas tree.   Illness resp in family  wheezes more easily .  ROS: See pertinent positives and negatives per HPI.  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     Family History  Problem Relation Age of Onset   Cancer Mother        rare blood cancer   Stroke Mother    Heart failure Mother    Hypertension Father    Nephrolithiasis Father    Diabetes Father    COPD Father    Cancer Father        kidney   Hyperlipidemia Father    Sudden death Father    Sleep apnea Father    Alcoholism Father    Obesity Father    Thyroid  disease Maternal Aunt    Arthritis Maternal Grandmother         rheumatoid   Heart disease Maternal Grandfather    Cancer Maternal Grandfather        stomach   Cancer Paternal Grandfather        stomach ca    Social History   Socioeconomic History   Marital status: Married    Spouse name: Burnetta   Number of children: 2   Years of education: Not on file   Highest education level: Bachelor's degree (e.g., BA, AB, BS)  Occupational History   Occupation: Work from home  Tobacco Use   Smoking status: Never    Passive exposure: Never   Smokeless tobacco: Never  Vaping Use   Vaping status: Never Used  Substance and Sexual Activity   Alcohol use: Yes    Alcohol/week: 1.0 standard drink of alcohol  Types: 1 Standard drinks or equivalent per week    Comment: occasionally   Drug use: No   Sexual activity: Yes    Comment: tubal if CS  Other Topics Concern   Not on file  Social History Narrative   Usually receives 6 hours of sleep at night   4 people living in the home. No pets   Married 2 young children   Bachelors degree [employer] 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      Originally from [place]  increase her area for 20 years   G4P2   Social etoh no tobacco no ets FA    Social Drivers of Health   Tobacco Use: Low Risk (06/12/2024)   Patient History    Smoking Tobacco Use: Never    Smokeless Tobacco Use: Never    Passive Exposure: Never  Financial Resource Strain: Not on file  Food Insecurity: Not on file  Transportation Needs: Not on file  Physical Activity: Not on file  Stress: Not on file  Social Connections: Not on file  Depression (PHQ2-9): Low Risk (04/10/2024)   Depression (PHQ2-9)    PHQ-2 Score: 0  Alcohol Screen: Not on file  Housing: Not on file  Utilities: Not on file  Health Literacy: Not on file    Outpatient Medications Prior to Visit  Medication Sig Dispense Refill   Cetirizine HCl (ZYRTEC PO) Take by mouth as needed.     estradiol (ESTRACE) 0.5 MG tablet Take 0.5 mg by mouth daily.     meclizine   (ANTIVERT ) 25 MG tablet Take 1 tablet (25 mg total) by mouth 3 (three) times daily as needed for dizziness. 30 tablet 0   sertraline  (ZOLOFT ) 25 MG tablet TAKE 1 TABLET (25 MG TOTAL) BY MOUTH DAILY. 90 tablet 0   valsartan  (DIOVAN ) 80 MG tablet Take 1 tablet (80 mg total) by mouth daily. For high blood pressure 90 tablet 1   amoxicillin -clavulanate (AUGMENTIN ) 875-125 MG tablet Take 1 tablet by mouth 2 (two) times daily. (Patient not taking: Reported on 06/12/2024) 20 tablet 0   ofloxacin  (FLOXIN ) 0.3 % OTIC solution Place 5 drops into the right ear 2 (two) times daily. (Patient not taking: Reported on 06/12/2024) 5 mL 0   methylphenidate  (CONCERTA ) 54 MG PO CR tablet Take 1 tablet (54 mg total) by mouth every morning. (Patient not taking: Reported on 06/12/2024) 30 tablet 0   No facility-administered medications prior to visit.     EXAM:  BP 139/86 (BP Location: Right Arm, Patient Position: Sitting, Cuff Size: Large)   Pulse 81   Temp 98.3 F (36.8 C) (Oral)   Ht 5' 9 (1.753 m)   Wt 282 lb (127.9 kg)   LMP 05/24/2024 (Exact Date)   SpO2 96%   BMI 41.64 kg/m   Body mass index is 41.64 kg/m. Wt Readings from Last 3 Encounters:  06/12/24 282 lb (127.9 kg)  04/10/24 276 lb 1.6 oz (125.2 kg)  02/21/24 275 lb 6.4 oz (124.9 kg)    GENERAL: vitals reviewed and listed above, alert, oriented, appears well hydrated and in no acute distress HEENT: atraumatic, conjunctiva  clear, no obvious abnormalities on inspection of external nose and ears OP : no lesion edema or exudate  NECK: no obvious masses on inspection palpation  LUNGS: clear to auscultation bilaterally, no wheezes, rales or rhonchi, good air movement CV: HRRR, no clubbing cyanosis or  peripheral edema nl cap refill  MS: moves all extremities without noticeable focal  abnormality  PSYCH: pleasant and cooperative, no obvious depression or anxiety Lab Results  Component Value Date   WBC 8.7 10/18/2023   HGB 14.1 10/18/2023    HCT 42.9 10/18/2023   PLT 234.0 10/18/2023   GLUCOSE 97 10/18/2023   CHOL 160 10/18/2023   TRIG 86.0 10/18/2023   HDL 54.70 10/18/2023   LDLCALC 88 10/18/2023   ALT 17 10/18/2023   AST 16 10/18/2023   NA 138 10/18/2023   K 3.9 10/18/2023   CL 103 10/18/2023   CREATININE 0.69 10/18/2023   BUN 9 10/18/2023   CO2 27 10/18/2023   TSH 2.55 10/18/2023   HGBA1C 5.4 10/18/2023   BP Readings from Last 3 Encounters:  06/12/24 139/86  04/10/24 (!) 140/86  10/18/23 126/78   ASSESSMENT AND PLAN:  Discussed the following assessment and plan:  ADHD, predominantly inattentive type  Morbid obesity (HCC)  Medication management  Colon cancer screening - Plan: Ambulatory referral to Gastroenterology  OSA (obstructive sleep apnea) Benefit risk refill Concerta  check into insurance coverage for 90 days at a time. Sleep apnea unclear severity but dental appliance was not acceptable. I would like her to get back with the pulmonary sleep team Dr. Margart Sell because lack of optimization of sleep apnea can manifest with ADD ADHD symptoms in addition to her condition. Will refer for colonoscopy screening she asked about network and she can check with insurance also. Referral made . Symptoms in regard to Christmas tree and respiratory illness sounds like viral illness resolving.  Discussed the waxing and waning typical symptoms of allergy although can readdress if recurring symptoms. Call ahead for lab orders   Consider  again for glp1 gip meds    and let us  know if  covers weight management meds and formulary -Patient advised to return or notify health care team  if  new concerns arise.  Patient Instructions  Get appt back with  Dr . Heidi  for the sleep apnea  management as discussed .   Will refill Concerta  today .    Chest is clear sounds more like viral infection  but consider other if recurring .   We can do hep c on  next blood testing  but  this is checked with blood donation.   Let  us  know  about insurance coverage  weight management   you would be a candidate for  wegovy or zep bound .    Janae Bonser K. Gauri Galvao M.D. "

## 2024-06-12 NOTE — Telephone Encounter (Signed)
 I sent in 30 of Concerta  today . Please  have her request #[phone] when she requests for us  to  refill .

## 2024-06-12 NOTE — Patient Instructions (Addendum)
 Get appt back with  Dr . Heidi  for the sleep apnea  management as discussed .   Will refill Concerta  today .    Chest is clear sounds more like viral infection  but consider other if recurring .   We can do hep c on  next blood testing  but  this is checked with blood donation.   Let us  know  about insurance coverage  weight management   you would be a candidate for  wegovy or zep bound .

## 2024-06-12 NOTE — Telephone Encounter (Signed)
 Copied from CRM #[id number]. Topic: General - Call Back - No Documentation >> Jun 12, 2024  2:27 PM Alfonso ORN wrote: Reason for CRM: pt called back to advise Panosh that her insurance will permit 90 day supply for Concerta  .

## 2024-06-13 NOTE — Telephone Encounter (Signed)
 Contacted pt and inform her of information below. Pt verbalized understanding.

## 2024-06-18 ENCOUNTER — Encounter: Payer: Self-pay | Admitting: Internal Medicine

## 2024-06-18 DIAGNOSIS — G4733 Obstructive sleep apnea (adult) (pediatric): Secondary | ICD-10-CM

## 2024-06-25 MED ORDER — ZEPBOUND 2.5 MG/0.5ML ~~LOC~~ SOAJ: 2.5000 mg | SUBCUTANEOUS | 1 refills | Status: AC

## 2024-06-25 NOTE — Telephone Encounter (Signed)
" °  Ok to rx zepbound   for PA  sent in  Dx OSA  morbid obesity and pre diabetes   "

## 2024-07-04 ENCOUNTER — Encounter: Payer: Self-pay | Admitting: Internal Medicine

## 2024-07-05 ENCOUNTER — Other Ambulatory Visit: Payer: Self-pay | Admitting: Internal Medicine
